# Patient Record
Sex: Female | Born: 1950 | Race: White | Hispanic: No | Marital: Married | Smoking: Former smoker
Health system: Southern US, Community
[De-identification: ages and names within clinical notes are randomized; demographics above are authoritative.]

## PROBLEM LIST (undated history)

## (undated) DIAGNOSIS — E119 Type 2 diabetes mellitus without complications: Secondary | ICD-10-CM

## (undated) DIAGNOSIS — I6529 Occlusion and stenosis of unspecified carotid artery: Secondary | ICD-10-CM

## (undated) DIAGNOSIS — I82409 Acute embolism and thrombosis of unspecified deep veins of unspecified lower extremity: Secondary | ICD-10-CM

## (undated) DIAGNOSIS — E785 Hyperlipidemia, unspecified: Secondary | ICD-10-CM

## (undated) HISTORY — PX: LEG SURGERY: SHX1003

## (undated) HISTORY — DX: Hyperlipidemia, unspecified: E78.5

## (undated) HISTORY — DX: Acute embolism and thrombosis of unspecified deep veins of unspecified lower extremity: I82.409

## (undated) HISTORY — DX: Occlusion and stenosis of unspecified carotid artery: I65.29

## (undated) HISTORY — PX: ABDOMINAL HYSTERECTOMY: SHX81

## (undated) HISTORY — DX: Type 2 diabetes mellitus without complications: E11.9

## (undated) HISTORY — PX: ROTATOR CUFF REPAIR: SHX139

---

## 2002-11-28 ENCOUNTER — Ambulatory Visit (HOSPITAL_COMMUNITY): Admission: RE | Admit: 2002-11-28 | Discharge: 2002-11-28 | Payer: Self-pay | Admitting: *Deleted

## 2002-11-28 ENCOUNTER — Encounter: Payer: Self-pay | Admitting: *Deleted

## 2003-08-22 ENCOUNTER — Inpatient Hospital Stay (HOSPITAL_COMMUNITY)
Admission: RE | Admit: 2003-08-22 | Discharge: 2003-09-12 | Payer: Self-pay | Admitting: Physical Medicine & Rehabilitation

## 2007-04-06 ENCOUNTER — Ambulatory Visit: Payer: Self-pay | Admitting: Vascular Surgery

## 2008-03-21 ENCOUNTER — Ambulatory Visit: Payer: Self-pay | Admitting: Vascular Surgery

## 2009-03-27 ENCOUNTER — Ambulatory Visit: Payer: Self-pay | Admitting: Vascular Surgery

## 2009-10-02 ENCOUNTER — Ambulatory Visit: Payer: Self-pay | Admitting: Vascular Surgery

## 2010-03-21 ENCOUNTER — Ambulatory Visit: Payer: Self-pay | Admitting: Vascular Surgery

## 2010-12-16 ENCOUNTER — Ambulatory Visit
Admission: RE | Admit: 2010-12-16 | Discharge: 2010-12-16 | Payer: Self-pay | Source: Home / Self Care | Attending: Vascular Surgery | Admitting: Vascular Surgery

## 2011-04-22 NOTE — Procedures (Signed)
CAROTID DUPLEX EXAM   INDICATION:  Follow up known carotid disease.   HISTORY:  Diabetes:  no  Cardiac:  no  Hypertension:  no  Smoking:  previous  Previous Surgery:  no  CV History:  no  Amaurosis Fugax No, Paresthesias No, Hemiparesis No                                       RIGHT             LEFT  Brachial systolic pressure:         148               140  Brachial Doppler waveforms:         triphasic         triphasic  Vertebral direction of flow:        antegrade         antegrade  DUPLEX VELOCITIES (cm/sec)  CCA peak systolic                   108               126  ECA peak systolic                   140               103  ICA peak systolic                   309               156  ICA end diastolic                   92                10  PLAQUE MORPHOLOGY:                  heterogenous      heterogenous  PLAQUE AMOUNT:                      Moderate to severe                  mild  PLAQUE LOCATION:                    ICA/ECA           ICA/ECA   IMPRESSION:  1. Right internal carotid artery suggests 60-79% stenosis.  2. Left internal carotid artery suggests 40-59% stenosis.  3. Antegrade flow in bilateral vertebrals.        ___________________________________________  Di Kindle. Edilia Bo, M.D.   CB/MEDQ  D:  10/02/2009  T:  10/03/2009  Job:  [id number]
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
## 2011-04-22 NOTE — Procedures (Signed)
CAROTID DUPLEX EXAM   INDICATION:  Follow up known carotid disease.   HISTORY:  Diabetes:  No.  Cardiac:  No.  Hypertension:  No.  Smoking:  Quit.  Previous Surgery:  No.  CV History:  Amaurosis Fugax No, Paresthesias No, Hemiparesis No.                                       RIGHT             LEFT  Brachial systolic pressure:         130               120  Brachial Doppler waveforms:         WNL               WNL  Vertebral direction of flow:        Antegrade         Antegrade  DUPLEX VELOCITIES (cm/sec)  CCA peak systolic                   108               91  ECA peak systolic                   113               90  ICA peak systolic                   240               121  ICA end diastolic                   81                53  PLAQUE MORPHOLOGY:                  Soft/heterogenous Heterogenous  PLAQUE AMOUNT:                      Moderate          Mild  PLAQUE LOCATION:                    ICA               ICA   IMPRESSION:  1. 60% to 79% right internal carotid artery stenosis.  2. 40% to 59% left internal carotid artery stenosis.  3. Antegrade vertebral arteries bilaterally.  4. Stable findings compared to study of 03/21/2010.   ___________________________________________  Quita Skye. Hart [place], M.D.   LT/MEDQ  D:  12/16/2010  T:  12/16/2010  Job:  [id number]

## 2011-04-22 NOTE — Procedures (Signed)
CAROTID DUPLEX EXAM   INDICATION:  Followup known carotid artery disease.   HISTORY:  Diabetes:  No.  Cardiac:  No.  Hypertension:  No.  Smoking:  Quit.  Previous Surgery:  No.  CV History:  No.  Amaurosis Fugax No, Paresthesias No, Hemiparesis No                                       RIGHT             LEFT  Brachial systolic pressure:         120               122  Brachial Doppler waveforms:         Biphasic          Biphasic  Vertebral direction of flow:        Antegrade         Antegrade  DUPLEX VELOCITIES (cm/sec)  CCA peak systolic                   112               99  ECA peak systolic                   111               139  ICA peak systolic                   282               152  ICA end diastolic                   64                37  PLAQUE MORPHOLOGY:                  Heterogeneous     Heterogeneous  PLAQUE AMOUNT:                      Moderate to severe                  Mild  PLAQUE LOCATION:                    ICA and ECA       ICA and ECA   IMPRESSION:  1. 60%-79% stenosis noted in the right internal carotid artery.  2. 40%-59% stenosis noted in the left internal carotid artery.  3. Antegrade bilateral vertebral arteries.   ___________________________________________  Quita Skye Hart [place], M.D.   MG/MEDQ  D:  03/21/2010  T:  03/21/2010  Job:  [id number]

## 2011-04-22 NOTE — Procedures (Signed)
CAROTID DUPLEX EXAM   INDICATION:  Carotid disease.   HISTORY:  Diabetes:  No.  Cardiac:  No.  Hypertension:  No.  Smoking:  Previous.  Previous Surgery:  No.  CV History:  Currently asymptomatic.  Amaurosis Fugax No, Paresthesias No, Hemiparesis No.                                       RIGHT             LEFT  Brachial systolic pressure:         130               138  Brachial Doppler waveforms:         Normal            Normal  Vertebral direction of flow:        Antegrade         Antegrade  DUPLEX VELOCITIES (cm/sec)  CCA peak systolic                   77                134  ECA peak systolic                   131               130  ICA peak systolic                   264               121  ICA end diastolic                   77                36  PLAQUE MORPHOLOGY:                  Heterogenous      Heterogenous  PLAQUE AMOUNT:                      Moderate/Severe   Mild  PLAQUE LOCATION:                    ICA/ECA           ICA/ECA   IMPRESSION:  1. Doppler velocities suggest a high-end 60-79% stenosis of the right      internal carotid artery.  2. 40-59% stenosis of the left internal carotid artery.  3. No significant change noted when compared to the previous      examination on 03/21/08.   ___________________________________________  Di Kindle. Edilia Bo, M.D.   CH/MEDQ  D:  03/27/2009  T:  03/27/2009  Job:  [id number]
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
## 2011-06-26 ENCOUNTER — Other Ambulatory Visit: Payer: Self-pay

## 2011-06-26 ENCOUNTER — Ambulatory Visit: Payer: Self-pay | Admitting: Vascular Surgery

## 2011-07-01 ENCOUNTER — Ambulatory Visit: Payer: Self-pay | Admitting: Vascular Surgery

## 2011-07-01 ENCOUNTER — Other Ambulatory Visit: Payer: Self-pay

## 2011-07-21 ENCOUNTER — Encounter: Payer: Self-pay | Admitting: Vascular Surgery

## 2011-08-05 ENCOUNTER — Ambulatory Visit (INDEPENDENT_AMBULATORY_CARE_PROVIDER_SITE_OTHER): Payer: PRIVATE HEALTH INSURANCE | Admitting: Vascular Surgery

## 2011-08-05 ENCOUNTER — Encounter: Payer: Self-pay | Admitting: Vascular Surgery

## 2011-08-05 ENCOUNTER — Ambulatory Visit (INDEPENDENT_AMBULATORY_CARE_PROVIDER_SITE_OTHER): Payer: PRIVATE HEALTH INSURANCE

## 2011-08-05 VITALS — BP 137/82 | HR 80 | Temp 98.4°F | Ht 67.0 in | Wt 230.0 lb

## 2011-08-05 DIAGNOSIS — I6529 Occlusion and stenosis of unspecified carotid artery: Secondary | ICD-10-CM
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
## 2011-08-19 NOTE — Procedures (Unsigned)
CAROTID DUPLEX EXAM  INDICATION:  Carotid disease.  HISTORY: Diabetes:  No. Cardiac:  No. Hypertension:  No. Smoking:  Quit 2004. Previous Surgery:  No. CV History:  Currently asymptomatic. Amaurosis Fugax No, Paresthesias No, Hemiparesis No. Other:  Hyperlipidemia.                                      RIGHT             LEFT Brachial systolic pressure:         130               120 Brachial Doppler waveforms:         Normal            Normal Vertebral direction of flow:        Antegrade         Antegrade DUPLEX VELOCITIES (cm/sec) CCA peak systolic                   87                106 ECA peak systolic                   113               117 ICA peak systolic                   279               123 ICA end diastolic                   97                38 PLAQUE MORPHOLOGY:                  Soft/heterogenous Heterogenous PLAQUE AMOUNT:                      Moderate          Mild PLAQUE LOCATION:                    ICA               ICA  IMPRESSION: 1. Right internal carotid artery velocities suggest 60% to 79%     stenosis. 2. Left internal carotid artery velocities suggest 40% to 59%     stenosis. 3. Antegrade vertebral arteries bilaterally. 4. Essentially stable in comparison to the study performed 12/16/2010.  ___________________________________________ Quita Skye. Hart Rochester, M.D.  EM/MEDQ  D:  08/05/2011  T:  08/05/2011  Job:  [id number]

## 2012-01-19 DIAGNOSIS — Z86010 Personal history of colonic polyps: Secondary | ICD-10-CM | POA: Diagnosis not present

## 2012-01-19 DIAGNOSIS — K591 Functional diarrhea: Secondary | ICD-10-CM | POA: Diagnosis not present

## 2012-02-02 ENCOUNTER — Other Ambulatory Visit: Payer: Self-pay | Admitting: *Deleted

## 2012-02-02 DIAGNOSIS — I6529 Occlusion and stenosis of unspecified carotid artery: Secondary | ICD-10-CM

## 2012-02-05 ENCOUNTER — Other Ambulatory Visit (INDEPENDENT_AMBULATORY_CARE_PROVIDER_SITE_OTHER): Payer: BC Managed Care – PPO | Admitting: *Deleted

## 2012-02-05 DIAGNOSIS — I6529 Occlusion and stenosis of unspecified carotid artery: Secondary | ICD-10-CM

## 2012-02-18 ENCOUNTER — Other Ambulatory Visit: Payer: Self-pay | Admitting: *Deleted

## 2012-02-18 DIAGNOSIS — I6529 Occlusion and stenosis of unspecified carotid artery: Secondary | ICD-10-CM

## 2012-02-19 ENCOUNTER — Encounter: Payer: Self-pay | Admitting: Vascular Surgery

## 2012-02-19 NOTE — Procedures (Unsigned)
CAROTID DUPLEX EXAM  INDICATION:  Carotid disease.  HISTORY: Diabetes:  No. Cardiac:  No. Hypertension:  No. Smoking:  Quit in 2004. Previous Surgery:  No. CV History:  Asymptomatic. Amaurosis Fugax No, Paresthesias No, Hemiparesis No. Other:  Hyperlipidemia.                                      RIGHT             LEFT Brachial systolic pressure:         128               125 Brachial Doppler waveforms:         Normal            Normal Vertebral direction of flow:        Antegrade         Antegrade DUPLEX VELOCITIES (cm/sec) CCA peak systolic                   101               94 ECA peak systolic                   101               98 ICA peak systolic                   202               111 ICA end diastolic                   74                36 PLAQUE MORPHOLOGY:                  Mixed             Heterogenous PLAQUE AMOUNT:                      Moderate          Mild PLAQUE LOCATION:                    ICA               ICA  IMPRESSION: 1. Right internal carotid artery velocities suggest 60% to 79%     stenosis. 2. Left internal carotid artery suggests high-end 1% to 39% stenosis. 3. Antegrade vertebral arteries bilaterally. 4. Stable in comparison to the previous examination.  ___________________________________________ Quita Skye Hart Rochester, M.D.  EM/MEDQ  D:  02/06/2012  T:  02/06/2012  Job:  [id number]

## 2012-08-06 ENCOUNTER — Encounter: Payer: Self-pay | Admitting: Neurosurgery

## 2012-08-10 ENCOUNTER — Ambulatory Visit: Payer: BC Managed Care – PPO | Admitting: Neurosurgery

## 2012-08-10 ENCOUNTER — Other Ambulatory Visit: Payer: BC Managed Care – PPO

## 2012-08-13 ENCOUNTER — Encounter: Payer: Self-pay | Admitting: Neurosurgery

## 2012-08-16 ENCOUNTER — Other Ambulatory Visit (INDEPENDENT_AMBULATORY_CARE_PROVIDER_SITE_OTHER): Payer: BC Managed Care – PPO | Admitting: *Deleted

## 2012-08-16 ENCOUNTER — Ambulatory Visit (INDEPENDENT_AMBULATORY_CARE_PROVIDER_SITE_OTHER): Payer: BC Managed Care – PPO | Admitting: Neurosurgery

## 2012-08-16 ENCOUNTER — Other Ambulatory Visit: Payer: Self-pay | Admitting: *Deleted

## 2012-08-16 ENCOUNTER — Encounter: Payer: Self-pay | Admitting: Neurosurgery

## 2012-08-16 VITALS — BP 126/69 | HR 92 | Resp 16 | Ht 67.0 in | Wt 202.0 lb

## 2012-08-16 DIAGNOSIS — I6529 Occlusion and stenosis of unspecified carotid artery: Secondary | ICD-10-CM

## 2012-08-16 NOTE — Progress Notes (Signed)
[facility]  Carotid Office Note  CC: Six-month carotid surveillance Referring Physician: Hart Blevins  History of Present Illness: 61 year old female patient of Dr. Hart Blevins with known carotid stenosis. The patient denies any signs or symptoms of CVA,  TIA, amaurosis fugax or neural deficit. The patient is in a wheelchair for mobilization but has no other vascular complaints.  Past Medical History  Diagnosis Date  . Carotid artery occlusion   . Hyperlipidemia     ROS: [x]  Positive   [ ]  Denies    General: [ ]  Weight loss, [ ]  Fever, [ ]  chills Neurologic: [ ]  Dizziness, [ ]  Blackouts, [ ]  Seizure [ ]  Stroke, [ ]  "Mini stroke", [ ]  Slurred speech, [ ]  Temporary blindness; [ ]  weakness in arms or legs, [ ]  Hoarseness Cardiac: [x ] Chest pain/pressure, [ ]  Shortness of breath at rest [ x] Shortness of breath with exertion, [ ]  Atrial fibrillation or irregular heartbeat Vascular: x[ ]  Pain in legs with walking, [x ] Pain in legs at rest, [ ]  Pain in legs at night,  [ ]  Non-healing ulcer, [ ]  Blood clot in vein/DVT,   Pulmonary: [ ]  Home oxygen, [ x] Productive cough, [ ]  Coughing up blood, [x ] Asthma,  [x ] Wheezing Musculoskeletal:  [ ]  Arthritis, [ ]  Low back pain, [ ]  Joint pain Hematologic: [ ]  Easy Bruising, [ ]  Anemia; [ ]  Hepatitis Gastrointestinal: [ ]  Blood in stool, [ ]  Gastroesophageal Reflux/heartburn, [ ]  Trouble swallowing Urinary: [ ]  chronic Kidney disease, [ ]  on HD - [ ]  MWF or [ ]  TTHS, [ ]  Burning with urination, [ ]  Difficulty urinating Skin: [ ]  Rashes, [ ]  Wounds Psychological: [ ]  Anxiety, [x ] Depression   Social History History  Substance Use Topics  . Smoking status: Former Smoker -- 1.0 packs/day for 30 years    Types: Cigarettes    Quit date: 07/21/2003  . Smokeless tobacco: Not on file  . Alcohol Use: No    Family History Family History  Problem Relation Age of Onset  . Heart disease Maternal Grandfather     No Known  Allergies  Current Outpatient Prescriptions  Medication Sig Dispense Refill  . atorvastatin (LIPITOR) 20 MG tablet Take 80 mg by mouth daily.       . clopidogrel (PLAVIX) 75 MG tablet Take 75 mg by mouth daily.        . diazepam (VALIUM) 5 MG tablet Take 5 mg by mouth every 8 (eight) hours as needed.        . fexofenadine-pseudoephedrine (ALLEGRA-D) 60-120 MG per tablet Take 1 tablet by mouth daily. Prn only      . naproxen sodium (ANAPROX) 220 MG tablet Take 220 mg by mouth as needed.        Marland Kitchen omeprazole (PRILOSEC) 20 MG capsule Take 20 mg by mouth daily.        Marland Kitchen oxyCODONE (OXY IR/ROXICODONE) 5 MG immediate release tablet Take 5 mg by mouth as needed.      . RABEprazole (ACIPHEX) 20 MG tablet Take 20 mg by mouth daily.        Marland Kitchen FLUoxetine (PROZAC) 40 MG capsule Take 40 mg by mouth daily.      . methadone (DOLOPHINE) 5 MG tablet Take 5 mg by mouth daily.        Physical Examination  Filed Vitals:   08/16/12 1526  BP: 126/69  Pulse: 92  Resp:  Body mass index is 31.64 kg/(m^2).  General:  WDWN in NAD Gait: Normal HEENT: WNL Eyes: Pupils equal Pulmonary: normal non-labored breathing , without Rales, rhonchi,  wheezing Cardiac: RRR, without  Murmurs, rubs or gallops; Abdomen: soft, NT, no masses Skin: no rashes, ulcers noted  Vascular Exam Pulses: 2+ radial pulses bilaterally Carotid bruits: Carotid pulses to auscultation on the left, bruit heard on the right Extremities without ischemic changes, no Gangrene , no cellulitis; no open wounds;  Musculoskeletal: no muscle wasting or atrophy   Neurologic: A&O X 3; Appropriate Affect ; SENSATION: normal; MOTOR FUNCTION:  moving all extremities equally. Speech is fluent/normal  Non-Invasive Vascular Imaging CAROTID DUPLEX 08/16/2012  Right ICA 60 - 79 % stenosis Left ICA 20 - 39 % stenosis   ASSESSMENT/PLAN: Asymptomatic patient with advanced right ICA stenosis. The patient will followup here in 6 months with repeat carotid  duplex. The patient's questions were encouraged and answered, she is in agreement with this plan. The patient knows the signs and symptoms of CVA and knows to go to the nearest emergency department should that occur.  [name] ANP   Clinic MD: Leslie Blevins

## 2013-01-25 DIAGNOSIS — J069 Acute upper respiratory infection, unspecified: Secondary | ICD-10-CM | POA: Diagnosis not present

## 2013-01-25 DIAGNOSIS — E039 Hypothyroidism, unspecified: Secondary | ICD-10-CM | POA: Diagnosis not present

## 2013-02-14 ENCOUNTER — Encounter: Payer: Self-pay | Admitting: Neurosurgery

## 2013-02-15 ENCOUNTER — Ambulatory Visit: Payer: BC Managed Care – PPO | Admitting: Neurosurgery

## 2013-02-15 ENCOUNTER — Other Ambulatory Visit (INDEPENDENT_AMBULATORY_CARE_PROVIDER_SITE_OTHER): Payer: Medicare Other | Admitting: *Deleted

## 2013-02-15 DIAGNOSIS — I6529 Occlusion and stenosis of unspecified carotid artery: Secondary | ICD-10-CM | POA: Diagnosis not present

## 2013-02-16 ENCOUNTER — Other Ambulatory Visit: Payer: Self-pay | Admitting: *Deleted

## 2013-02-17 ENCOUNTER — Encounter: Payer: Self-pay | Admitting: Vascular Surgery

## 2013-02-28 DIAGNOSIS — E039 Hypothyroidism, unspecified: Secondary | ICD-10-CM | POA: Diagnosis not present

## 2013-08-22 ENCOUNTER — Encounter: Payer: Self-pay | Admitting: Family

## 2013-08-23 ENCOUNTER — Encounter: Payer: Self-pay | Admitting: Family

## 2013-08-23 ENCOUNTER — Ambulatory Visit (INDEPENDENT_AMBULATORY_CARE_PROVIDER_SITE_OTHER): Payer: No Typology Code available for payment source | Admitting: Family

## 2013-08-23 ENCOUNTER — Other Ambulatory Visit (INDEPENDENT_AMBULATORY_CARE_PROVIDER_SITE_OTHER): Payer: No Typology Code available for payment source | Admitting: *Deleted

## 2013-08-23 DIAGNOSIS — M79609 Pain in unspecified limb: Secondary | ICD-10-CM | POA: Insufficient documentation

## 2013-08-23 DIAGNOSIS — I6529 Occlusion and stenosis of unspecified carotid artery: Secondary | ICD-10-CM
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
## 2013-08-23 NOTE — Patient Instructions (Signed)
Stroke Prevention Some medical conditions and behaviors are associated with an increased chance of having a stroke. You may prevent a stroke by making healthy choices and managing medical conditions. Reduce your risk of having a stroke by:  Staying physically active. Get at least 30 minutes of activity on most or all days.  Not smoking. It may also be helpful to avoid exposure to secondhand smoke.  Limiting alcohol use. Moderate alcohol use is considered to be:  No more than 2 drinks per day for men.  No more than 1 drink per day for nonpregnant women.  Eating healthy foods.  Include 5 or more servings of fruits and vegetables a day.  Certain diets may be prescribed to address high blood pressure, high cholesterol, diabetes, or obesity.  Managing your cholesterol levels.  A low-saturated fat, low-trans fat, low-cholesterol, and high-fiber diet may control cholesterol levels.  Take any prescribed medicines to control cholesterol as directed by your caregiver.  Managing your diabetes.  A controlled-carbohydrate, controlled-sugar diet is recommended to manage diabetes.  Take any prescribed medicines to control diabetes as directed by your caregiver.  Controlling your high blood pressure (hypertension).  A low-salt (sodium), low-saturated fat, low-trans fat, and low-cholesterol diet is recommended to manage high blood pressure.  Take any prescribed medicines to control hypertension as directed by your caregiver.  Maintaining a healthy weight.  A reduced-calorie, low-sodium, low-saturated fat, low-trans fat, low-cholesterol diet is recommended to manage weight.  Stopping drug abuse.  Avoiding birth control pills.  Talk to your caregiver about the risks of taking birth control pills if you are over 35 years old, smoke, get migraines, or have ever had a blood clot.  Getting evaluated for sleep disorders (sleep apnea).  Talk to your caregiver about getting a sleep evaluation  if you snore a lot or have excessive sleepiness.  Taking medicines as directed by your caregiver.  For some people, aspirin or blood thinners (anticoagulants) are helpful in reducing the risk of forming abnormal blood clots that can lead to stroke. If you have the irregular heart rhythm of atrial fibrillation, you should be on a blood thinner unless there is a good reason you cannot take them.  Understand all your medicine instructions. SEEK IMMEDIATE MEDICAL CARE IF:   You have sudden weakness or numbness of the face, arm, or leg, especially on one side of the body.  You have sudden confusion.  You have trouble speaking (aphasia) or understanding.  You have sudden trouble seeing in one or both eyes.  You have sudden trouble walking.  You have dizziness.  You have a loss of balance or coordination.  You have a sudden, severe headache with no known cause.  You have new chest pain or an irregular heartbeat. Any of these symptoms may represent a serious problem that is an emergency. Do not wait to see if the symptoms will go away. Get medical help right away. Call your local emergency services (911 in [place] Do not drive yourself to the hospital. Document Released: 01/01/2005 Document Revised: 02/16/2012 Document Reviewed: 07/14/2011 [employer] Patient Information 2014 [employer].  

## 2013-10-27 DIAGNOSIS — IMO0001 Reserved for inherently not codable concepts without codable children: Secondary | ICD-10-CM | POA: Diagnosis not present

## 2013-10-27 DIAGNOSIS — Z79899 Other long term (current) drug therapy: Secondary | ICD-10-CM | POA: Diagnosis not present

## 2013-10-27 DIAGNOSIS — E7800 Pure hypercholesterolemia, unspecified: Secondary | ICD-10-CM | POA: Diagnosis not present

## 2013-10-27 DIAGNOSIS — Z23 Encounter for immunization: Secondary | ICD-10-CM | POA: Diagnosis not present

## 2014-01-12 ENCOUNTER — Other Ambulatory Visit: Payer: Self-pay | Admitting: Family

## 2014-01-12 DIAGNOSIS — I6529 Occlusion and stenosis of unspecified carotid artery: Secondary | ICD-10-CM

## 2014-01-12 DIAGNOSIS — M79609 Pain in unspecified limb: Secondary | ICD-10-CM

## 2014-02-03 DIAGNOSIS — E039 Hypothyroidism, unspecified: Secondary | ICD-10-CM | POA: Diagnosis not present

## 2014-02-03 DIAGNOSIS — G8929 Other chronic pain: Secondary | ICD-10-CM | POA: Diagnosis not present

## 2014-02-03 DIAGNOSIS — IMO0001 Reserved for inherently not codable concepts without codable children: Secondary | ICD-10-CM | POA: Diagnosis not present

## 2014-02-23 ENCOUNTER — Ambulatory Visit: Payer: Medicare Other | Admitting: Family

## 2014-02-23 ENCOUNTER — Other Ambulatory Visit (HOSPITAL_COMMUNITY): Payer: Medicare Other

## 2014-03-21 ENCOUNTER — Encounter: Payer: Self-pay | Admitting: Family

## 2014-03-22 ENCOUNTER — Ambulatory Visit (INDEPENDENT_AMBULATORY_CARE_PROVIDER_SITE_OTHER): Payer: Medicare Other | Admitting: Family

## 2014-03-22 ENCOUNTER — Encounter: Payer: Self-pay | Admitting: Family

## 2014-03-22 ENCOUNTER — Ambulatory Visit (HOSPITAL_COMMUNITY)
Admission: RE | Admit: 2014-03-22 | Discharge: 2014-03-22 | Disposition: A | Discharge status: Home / Self Care | Payer: Medicare Other | Source: Ambulatory Visit | Attending: Family | Admitting: Family

## 2014-03-22 VITALS — BP 118/74 | HR 71 | Resp 16 | Ht 67.5 in | Wt 179.0 lb

## 2014-03-22 DIAGNOSIS — I6529 Occlusion and stenosis of unspecified carotid artery: Secondary | ICD-10-CM

## 2014-03-22 DIAGNOSIS — M79609 Pain in unspecified limb: Secondary | ICD-10-CM | POA: Diagnosis not present
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
## 2014-08-23 DIAGNOSIS — H547 Unspecified visual loss: Secondary | ICD-10-CM | POA: Diagnosis not present

## 2014-08-23 DIAGNOSIS — H2510 Age-related nuclear cataract, unspecified eye: Secondary | ICD-10-CM | POA: Diagnosis not present

## 2014-08-23 DIAGNOSIS — H35349 Macular cyst, hole, or pseudohole, unspecified eye: Secondary | ICD-10-CM | POA: Diagnosis not present

## 2014-08-23 DIAGNOSIS — E119 Type 2 diabetes mellitus without complications: Secondary | ICD-10-CM | POA: Diagnosis not present

## 2014-09-18 ENCOUNTER — Other Ambulatory Visit: Payer: Self-pay | Admitting: *Deleted

## 2014-09-18 DIAGNOSIS — I6523 Occlusion and stenosis of bilateral carotid arteries: Secondary | ICD-10-CM

## 2014-09-21 ENCOUNTER — Encounter: Payer: Self-pay | Admitting: Family

## 2014-09-22 ENCOUNTER — Ambulatory Visit (HOSPITAL_COMMUNITY)
Admission: RE | Admit: 2014-09-22 | Discharge: 2014-09-22 | Disposition: A | Discharge status: Home / Self Care | Payer: Medicare Other | Source: Ambulatory Visit | Attending: Family | Admitting: Family

## 2014-09-22 ENCOUNTER — Encounter: Payer: Self-pay | Admitting: Family

## 2014-09-22 ENCOUNTER — Ambulatory Visit (INDEPENDENT_AMBULATORY_CARE_PROVIDER_SITE_OTHER): Payer: Medicare Other | Admitting: Family

## 2014-09-22 VITALS — BP 111/65 | HR 64 | Resp 16 | Ht 68.0 in | Wt 179.0 lb

## 2014-09-22 DIAGNOSIS — R51 Headache: Secondary | ICD-10-CM

## 2014-09-22 DIAGNOSIS — I6529 Occlusion and stenosis of unspecified carotid artery: Secondary | ICD-10-CM | POA: Insufficient documentation

## 2014-09-22 DIAGNOSIS — R519 Headache, unspecified: Secondary | ICD-10-CM | POA: Insufficient documentation

## 2014-09-22 DIAGNOSIS — M79603 Pain in arm, unspecified: Secondary | ICD-10-CM | POA: Diagnosis not present

## 2014-09-22 DIAGNOSIS — I6523 Occlusion and stenosis of bilateral carotid arteries: Secondary | ICD-10-CM | POA: Insufficient documentation

## 2014-09-22 DIAGNOSIS — G8929 Other chronic pain: Secondary | ICD-10-CM

## 2014-09-22 NOTE — Addendum Note (Signed)
Addended by: Sharee PimpleMCCHESNEY, MARILYN K on: 09/22/2014 02:58 PM   Modules accepted: Orders
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
## 2014-09-22 NOTE — Patient Instructions (Signed)
Stroke Prevention Some medical conditions and behaviors are associated with an increased chance of having a stroke. You may prevent a stroke by making healthy choices and managing medical conditions. HOW CAN I REDUCE MY RISK OF HAVING A STROKE?   Stay physically active. Get at least 30 minutes of activity on most or all days.  Do not smoke. It may also be helpful to avoid exposure to secondhand smoke.  Limit alcohol use. Moderate alcohol use is considered to be:  No more than 2 drinks per day for men.  No more than 1 drink per day for nonpregnant women.  Eat healthy foods. This involves:  Eating 5 or more servings of fruits and vegetables a day.  Making dietary changes that address high blood pressure (hypertension), high cholesterol, diabetes, or obesity.  Manage your cholesterol levels.  Making food choices that are high in fiber and low in saturated fat, trans fat, and cholesterol may control cholesterol levels.  Take any prescribed medicines to control cholesterol as directed by your health care provider.  Manage your diabetes.  Controlling your carbohydrate and sugar intake is recommended to manage diabetes.  Take any prescribed medicines to control diabetes as directed by your health care provider.  Control your hypertension.  Making food choices that are low in salt (sodium), saturated fat, trans fat, and cholesterol is recommended to manage hypertension.  Take any prescribed medicines to control hypertension as directed by your health care provider.  Maintain a healthy weight.  Reducing calorie intake and making food choices that are low in sodium, saturated fat, trans fat, and cholesterol are recommended to manage weight.  Stop drug abuse.  Avoid taking birth control pills.  Talk to your health care provider about the risks of taking birth control pills if you are over 35 years old, smoke, get migraines, or have ever had a blood clot.  Get evaluated for sleep  disorders (sleep apnea).  Talk to your health care provider about getting a sleep evaluation if you snore a lot or have excessive sleepiness.  Take medicines only as directed by your health care provider.  For some people, aspirin or blood thinners (anticoagulants) are helpful in reducing the risk of forming abnormal blood clots that can lead to stroke. If you have the irregular heart rhythm of atrial fibrillation, you should be on a blood thinner unless there is a good reason you cannot take them.  Understand all your medicine instructions.  Make sure that other conditions (such as anemia or atherosclerosis) are addressed. SEEK IMMEDIATE MEDICAL CARE IF:   You have sudden weakness or numbness of the face, arm, or leg, especially on one side of the body.  Your face or eyelid droops to one side.  You have sudden confusion.  You have trouble speaking (aphasia) or understanding.  You have sudden trouble seeing in one or both eyes.  You have sudden trouble walking.  You have dizziness.  You have a loss of balance or coordination.  You have a sudden, severe headache with no known cause.  You have new chest pain or an irregular heartbeat. Any of these symptoms may represent a serious problem that is an emergency. Do not wait to see if the symptoms will go away. Get medical help at once. Call your local emergency services (911 in [place] Do not drive yourself to the hospital. Document Released: 01/01/2005 Document Revised: 04/10/2014 Document Reviewed: 05/27/2013 [employer] Patient Information 2015 [employer]. This information is not intended to replace advice given   to you by your health care provider. Make sure you discuss any questions you have with your health care provider.  

## 2014-09-25 DIAGNOSIS — E1165 Type 2 diabetes mellitus with hyperglycemia: Secondary | ICD-10-CM | POA: Diagnosis not present

## 2014-09-27 ENCOUNTER — Other Ambulatory Visit (HOSPITAL_COMMUNITY): Payer: Medicare Other

## 2014-09-27 ENCOUNTER — Ambulatory Visit: Payer: Medicare Other | Admitting: Family

## 2014-10-05 DIAGNOSIS — E780 Pure hypercholesterolemia: Secondary | ICD-10-CM | POA: Diagnosis not present

## 2014-10-05 DIAGNOSIS — E1159 Type 2 diabetes mellitus with other circulatory complications: Secondary | ICD-10-CM | POA: Diagnosis not present

## 2014-10-05 DIAGNOSIS — E1169 Type 2 diabetes mellitus with other specified complication: Secondary | ICD-10-CM | POA: Diagnosis not present

## 2014-10-05 DIAGNOSIS — Z23 Encounter for immunization: Secondary | ICD-10-CM | POA: Diagnosis not present

## 2014-12-12 DIAGNOSIS — H35342 Macular cyst, hole, or pseudohole, left eye: Secondary | ICD-10-CM | POA: Diagnosis not present

## 2014-12-12 DIAGNOSIS — H25812 Combined forms of age-related cataract, left eye: Secondary | ICD-10-CM | POA: Diagnosis not present

## 2015-01-16 DIAGNOSIS — Z79899 Other long term (current) drug therapy: Secondary | ICD-10-CM | POA: Diagnosis not present

## 2015-01-16 DIAGNOSIS — Z7982 Long term (current) use of aspirin: Secondary | ICD-10-CM | POA: Diagnosis not present

## 2015-01-16 DIAGNOSIS — H25812 Combined forms of age-related cataract, left eye: Secondary | ICD-10-CM | POA: Diagnosis not present

## 2015-01-16 DIAGNOSIS — H269 Unspecified cataract: Secondary | ICD-10-CM | POA: Diagnosis not present

## 2015-01-16 DIAGNOSIS — I10 Essential (primary) hypertension: Secondary | ICD-10-CM | POA: Diagnosis not present

## 2015-01-16 DIAGNOSIS — E039 Hypothyroidism, unspecified: Secondary | ICD-10-CM | POA: Diagnosis not present

## 2015-01-16 DIAGNOSIS — E785 Hyperlipidemia, unspecified: Secondary | ICD-10-CM | POA: Diagnosis not present

## 2015-01-16 DIAGNOSIS — E119 Type 2 diabetes mellitus without complications: Secondary | ICD-10-CM | POA: Diagnosis not present

## 2015-01-16 DIAGNOSIS — Z87891 Personal history of nicotine dependence: Secondary | ICD-10-CM | POA: Diagnosis not present

## 2015-01-16 DIAGNOSIS — E669 Obesity, unspecified: Secondary | ICD-10-CM | POA: Diagnosis not present

## 2015-02-27 DIAGNOSIS — K219 Gastro-esophageal reflux disease without esophagitis: Secondary | ICD-10-CM | POA: Diagnosis not present

## 2015-02-27 DIAGNOSIS — E119 Type 2 diabetes mellitus without complications: Secondary | ICD-10-CM | POA: Diagnosis not present

## 2015-02-27 DIAGNOSIS — H25811 Combined forms of age-related cataract, right eye: Secondary | ICD-10-CM | POA: Diagnosis not present

## 2015-02-27 DIAGNOSIS — H2511 Age-related nuclear cataract, right eye: Secondary | ICD-10-CM | POA: Diagnosis not present

## 2015-02-27 DIAGNOSIS — S82401S Unspecified fracture of shaft of right fibula, sequela: Secondary | ICD-10-CM | POA: Diagnosis not present

## 2015-02-27 DIAGNOSIS — E785 Hyperlipidemia, unspecified: Secondary | ICD-10-CM | POA: Diagnosis not present

## 2015-02-27 DIAGNOSIS — H269 Unspecified cataract: Secondary | ICD-10-CM | POA: Diagnosis not present

## 2015-02-27 DIAGNOSIS — Z7982 Long term (current) use of aspirin: Secondary | ICD-10-CM | POA: Diagnosis not present

## 2015-02-27 DIAGNOSIS — G8929 Other chronic pain: Secondary | ICD-10-CM | POA: Diagnosis not present

## 2015-02-27 DIAGNOSIS — E039 Hypothyroidism, unspecified: Secondary | ICD-10-CM | POA: Diagnosis not present

## 2015-02-27 DIAGNOSIS — F329 Major depressive disorder, single episode, unspecified: Secondary | ICD-10-CM | POA: Diagnosis not present

## 2015-02-27 DIAGNOSIS — Z87891 Personal history of nicotine dependence: Secondary | ICD-10-CM | POA: Diagnosis not present

## 2015-02-27 DIAGNOSIS — S82201S Unspecified fracture of shaft of right tibia, sequela: Secondary | ICD-10-CM | POA: Diagnosis not present

## 2015-03-30 ENCOUNTER — Ambulatory Visit: Payer: Medicare Other | Admitting: Family

## 2015-03-30 ENCOUNTER — Other Ambulatory Visit (HOSPITAL_COMMUNITY): Payer: Medicare Other

## 2015-05-08 ENCOUNTER — Encounter: Payer: Self-pay | Admitting: Family

## 2015-05-11 ENCOUNTER — Ambulatory Visit (HOSPITAL_COMMUNITY)
Admission: RE | Admit: 2015-05-11 | Discharge: 2015-05-11 | Disposition: A | Discharge status: Home / Self Care | Payer: Medicare Other | Source: Ambulatory Visit | Attending: Family | Admitting: Family

## 2015-05-11 ENCOUNTER — Other Ambulatory Visit: Payer: Self-pay | Admitting: Family

## 2015-05-11 ENCOUNTER — Ambulatory Visit (INDEPENDENT_AMBULATORY_CARE_PROVIDER_SITE_OTHER): Payer: Medicare Other | Admitting: Family

## 2015-05-11 ENCOUNTER — Encounter: Payer: Self-pay | Admitting: Family

## 2015-05-11 VITALS — BP 134/78 | HR 74 | Resp 16 | Ht 66.0 in | Wt 193.0 lb

## 2015-05-11 DIAGNOSIS — I6523 Occlusion and stenosis of bilateral carotid arteries: Secondary | ICD-10-CM

## 2015-05-11 DIAGNOSIS — I6521 Occlusion and stenosis of right carotid artery: Secondary | ICD-10-CM

## 2015-05-11 DIAGNOSIS — Z87891 Personal history of nicotine dependence: Secondary | ICD-10-CM | POA: Diagnosis not present

## 2015-05-11 NOTE — Patient Instructions (Signed)
Stroke Prevention Some medical conditions and behaviors are associated with an increased chance of having a stroke. You may prevent a stroke by making healthy choices and managing medical conditions. HOW CAN I REDUCE MY RISK OF HAVING A STROKE?   Stay physically active. Get at least 30 minutes of activity on most or all days.  Do not smoke. It may also be helpful to avoid exposure to secondhand smoke.  Limit alcohol use. Moderate alcohol use is considered to be:  No more than 2 drinks per day for men.  No more than 1 drink per day for nonpregnant women.  Eat healthy foods. This involves:  Eating 5 or more servings of fruits and vegetables a day.  Making dietary changes that address high blood pressure (hypertension), high cholesterol, diabetes, or obesity.  Manage your cholesterol levels.  Making food choices that are high in fiber and low in saturated fat, trans fat, and cholesterol may control cholesterol levels.  Take any prescribed medicines to control cholesterol as directed by your health care provider.  Manage your diabetes.  Controlling your carbohydrate and sugar intake is recommended to manage diabetes.  Take any prescribed medicines to control diabetes as directed by your health care provider.  Control your hypertension.  Making food choices that are low in salt (sodium), saturated fat, trans fat, and cholesterol is recommended to manage hypertension.  Take any prescribed medicines to control hypertension as directed by your health care provider.  Maintain a healthy weight.  Reducing calorie intake and making food choices that are low in sodium, saturated fat, trans fat, and cholesterol are recommended to manage weight.  Stop drug abuse.  Avoid taking birth control pills.  Talk to your health care provider about the risks of taking birth control pills if you are over 35 years old, smoke, get migraines, or have ever had a blood clot.  Get evaluated for sleep  disorders (sleep apnea).  Talk to your health care provider about getting a sleep evaluation if you snore a lot or have excessive sleepiness.  Take medicines only as directed by your health care provider.  For some people, aspirin or blood thinners (anticoagulants) are helpful in reducing the risk of forming abnormal blood clots that can lead to stroke. If you have the irregular heart rhythm of atrial fibrillation, you should be on a blood thinner unless there is a good reason you cannot take them.  Understand all your medicine instructions.  Make sure that other conditions (such as anemia or atherosclerosis) are addressed. SEEK IMMEDIATE MEDICAL CARE IF:   You have sudden weakness or numbness of the face, arm, or leg, especially on one side of the body.  Your face or eyelid droops to one side.  You have sudden confusion.  You have trouble speaking (aphasia) or understanding.  You have sudden trouble seeing in one or both eyes.  You have sudden trouble walking.  You have dizziness.  You have a loss of balance or coordination.  You have a sudden, severe headache with no known cause.  You have new chest pain or an irregular heartbeat. Any of these symptoms may represent a serious problem that is an emergency. Do not wait to see if the symptoms will go away. Get medical help at once. Call your local emergency services (911 in [place] Do not drive yourself to the hospital. Document Released: 01/01/2005 Document Revised: 04/10/2014 Document Reviewed: 05/27/2013 [employer] Patient Information 2015 [employer]. This information is not intended to replace advice given   to you by your health care provider. Make sure you discuss any questions you have with your health care provider.  
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
## 2015-05-15 NOTE — Addendum Note (Signed)
Addended by: Adria DillELDRIDGE-LEWIS,  L on: 05/15/2015 11:10 AM   Modules accepted: Orders

## 2015-06-14 DIAGNOSIS — E780 Pure hypercholesterolemia: Secondary | ICD-10-CM | POA: Diagnosis not present

## 2015-06-14 DIAGNOSIS — E039 Hypothyroidism, unspecified: Secondary | ICD-10-CM | POA: Diagnosis not present

## 2015-06-14 DIAGNOSIS — E1169 Type 2 diabetes mellitus with other specified complication: Secondary | ICD-10-CM | POA: Diagnosis not present

## 2015-06-14 DIAGNOSIS — E669 Obesity, unspecified: Secondary | ICD-10-CM | POA: Diagnosis not present

## 2015-06-14 DIAGNOSIS — E119 Type 2 diabetes mellitus without complications: Secondary | ICD-10-CM | POA: Diagnosis not present

## 2015-06-14 DIAGNOSIS — Z0000 Encounter for general adult medical examination without abnormal findings: Secondary | ICD-10-CM | POA: Diagnosis not present

## 2015-06-14 DIAGNOSIS — Z23 Encounter for immunization: Secondary | ICD-10-CM | POA: Diagnosis not present

## 2015-06-14 DIAGNOSIS — Z79899 Other long term (current) drug therapy: Secondary | ICD-10-CM | POA: Diagnosis not present

## 2015-07-05 DIAGNOSIS — M75101 Unspecified rotator cuff tear or rupture of right shoulder, not specified as traumatic: Secondary | ICD-10-CM | POA: Diagnosis not present

## 2015-08-08 DIAGNOSIS — H26493 Other secondary cataract, bilateral: Secondary | ICD-10-CM | POA: Diagnosis not present

## 2015-08-08 DIAGNOSIS — E11329 Type 2 diabetes mellitus with mild nonproliferative diabetic retinopathy without macular edema: Secondary | ICD-10-CM | POA: Diagnosis not present

## 2015-08-20 DIAGNOSIS — G8929 Other chronic pain: Secondary | ICD-10-CM | POA: Diagnosis not present

## 2015-08-20 DIAGNOSIS — M75101 Unspecified rotator cuff tear or rupture of right shoulder, not specified as traumatic: Secondary | ICD-10-CM | POA: Diagnosis not present

## 2015-08-20 DIAGNOSIS — R262 Difficulty in walking, not elsewhere classified: Secondary | ICD-10-CM | POA: Diagnosis not present

## 2015-08-20 DIAGNOSIS — Z23 Encounter for immunization: Secondary | ICD-10-CM | POA: Diagnosis not present

## 2015-08-20 DIAGNOSIS — M549 Dorsalgia, unspecified: Secondary | ICD-10-CM | POA: Diagnosis not present

## 2015-09-10 DIAGNOSIS — R402411 Glasgow coma scale score 13-15, in the field [EMT or ambulance]: Secondary | ICD-10-CM | POA: Diagnosis not present

## 2015-09-10 DIAGNOSIS — R4182 Altered mental status, unspecified: Secondary | ICD-10-CM | POA: Diagnosis not present

## 2015-09-10 DIAGNOSIS — R509 Fever, unspecified: Secondary | ICD-10-CM | POA: Diagnosis not present

## 2015-09-10 DIAGNOSIS — I639 Cerebral infarction, unspecified: Secondary | ICD-10-CM | POA: Diagnosis not present

## 2015-09-11 DIAGNOSIS — T40601A Poisoning by unspecified narcotics, accidental (unintentional), initial encounter: Secondary | ICD-10-CM | POA: Diagnosis not present

## 2015-09-11 DIAGNOSIS — E039 Hypothyroidism, unspecified: Secondary | ICD-10-CM | POA: Diagnosis present

## 2015-09-11 DIAGNOSIS — R1110 Vomiting, unspecified: Secondary | ICD-10-CM | POA: Diagnosis not present

## 2015-09-11 DIAGNOSIS — E1165 Type 2 diabetes mellitus with hyperglycemia: Secondary | ICD-10-CM | POA: Diagnosis not present

## 2015-09-11 DIAGNOSIS — Z7982 Long term (current) use of aspirin: Secondary | ICD-10-CM | POA: Diagnosis not present

## 2015-09-11 DIAGNOSIS — K2210 Ulcer of esophagus without bleeding: Secondary | ICD-10-CM | POA: Diagnosis present

## 2015-09-11 DIAGNOSIS — R4182 Altered mental status, unspecified: Secondary | ICD-10-CM | POA: Diagnosis not present

## 2015-09-11 DIAGNOSIS — R1310 Dysphagia, unspecified: Secondary | ICD-10-CM | POA: Diagnosis not present

## 2015-09-11 DIAGNOSIS — I639 Cerebral infarction, unspecified: Secondary | ICD-10-CM | POA: Diagnosis not present

## 2015-09-11 DIAGNOSIS — R4702 Dysphasia: Secondary | ICD-10-CM | POA: Diagnosis present

## 2015-09-11 DIAGNOSIS — R0602 Shortness of breath: Secondary | ICD-10-CM | POA: Diagnosis not present

## 2015-09-11 DIAGNOSIS — K259 Gastric ulcer, unspecified as acute or chronic, without hemorrhage or perforation: Secondary | ICD-10-CM | POA: Diagnosis present

## 2015-09-11 DIAGNOSIS — K222 Esophageal obstruction: Secondary | ICD-10-CM | POA: Diagnosis not present

## 2015-09-11 DIAGNOSIS — G8929 Other chronic pain: Secondary | ICD-10-CM | POA: Diagnosis present

## 2015-09-11 DIAGNOSIS — R509 Fever, unspecified: Secondary | ICD-10-CM | POA: Diagnosis not present

## 2015-09-11 DIAGNOSIS — F17290 Nicotine dependence, other tobacco product, uncomplicated: Secondary | ICD-10-CM | POA: Diagnosis present

## 2015-09-11 DIAGNOSIS — K2970 Gastritis, unspecified, without bleeding: Secondary | ICD-10-CM | POA: Diagnosis not present

## 2015-09-11 DIAGNOSIS — G9341 Metabolic encephalopathy: Secondary | ICD-10-CM | POA: Diagnosis not present

## 2015-09-11 DIAGNOSIS — F17210 Nicotine dependence, cigarettes, uncomplicated: Secondary | ICD-10-CM | POA: Diagnosis present

## 2015-09-11 DIAGNOSIS — J690 Pneumonitis due to inhalation of food and vomit: Secondary | ICD-10-CM | POA: Diagnosis not present

## 2015-09-11 DIAGNOSIS — K219 Gastro-esophageal reflux disease without esophagitis: Secondary | ICD-10-CM | POA: Diagnosis present

## 2015-09-11 DIAGNOSIS — K2980 Duodenitis without bleeding: Secondary | ICD-10-CM | POA: Diagnosis present

## 2015-09-11 DIAGNOSIS — A419 Sepsis, unspecified organism: Secondary | ICD-10-CM | POA: Diagnosis present

## 2015-09-11 DIAGNOSIS — Z79899 Other long term (current) drug therapy: Secondary | ICD-10-CM | POA: Diagnosis not present

## 2015-09-11 DIAGNOSIS — K5900 Constipation, unspecified: Secondary | ICD-10-CM | POA: Diagnosis present

## 2015-09-11 DIAGNOSIS — Z79891 Long term (current) use of opiate analgesic: Secondary | ICD-10-CM | POA: Diagnosis not present

## 2015-09-11 DIAGNOSIS — J9690 Respiratory failure, unspecified, unspecified whether with hypoxia or hypercapnia: Secondary | ICD-10-CM | POA: Diagnosis not present

## 2015-09-11 DIAGNOSIS — Z23 Encounter for immunization: Secondary | ICD-10-CM | POA: Diagnosis not present

## 2015-09-11 DIAGNOSIS — K5790 Diverticulosis of intestine, part unspecified, without perforation or abscess without bleeding: Secondary | ICD-10-CM | POA: Diagnosis present

## 2015-09-20 DIAGNOSIS — F329 Major depressive disorder, single episode, unspecified: Secondary | ICD-10-CM | POA: Diagnosis not present

## 2015-09-20 DIAGNOSIS — R4182 Altered mental status, unspecified: Secondary | ICD-10-CM | POA: Diagnosis not present

## 2015-09-20 DIAGNOSIS — K2210 Ulcer of esophagus without bleeding: Secondary | ICD-10-CM | POA: Diagnosis not present

## 2015-09-20 DIAGNOSIS — K253 Acute gastric ulcer without hemorrhage or perforation: Secondary | ICD-10-CM | POA: Diagnosis not present

## 2015-09-27 DIAGNOSIS — D649 Anemia, unspecified: Secondary | ICD-10-CM | POA: Diagnosis not present

## 2015-10-15 DIAGNOSIS — K251 Acute gastric ulcer with perforation: Secondary | ICD-10-CM | POA: Diagnosis not present

## 2015-10-15 DIAGNOSIS — K591 Functional diarrhea: Secondary | ICD-10-CM | POA: Diagnosis not present

## 2015-10-15 DIAGNOSIS — K210 Gastro-esophageal reflux disease with esophagitis: Secondary | ICD-10-CM | POA: Diagnosis not present

## 2015-10-15 DIAGNOSIS — R1310 Dysphagia, unspecified: Secondary | ICD-10-CM | POA: Diagnosis not present

## 2015-11-21 ENCOUNTER — Encounter: Payer: Self-pay | Admitting: Family

## 2015-11-27 ENCOUNTER — Ambulatory Visit (INDEPENDENT_AMBULATORY_CARE_PROVIDER_SITE_OTHER): Payer: Medicare Other | Admitting: Family

## 2015-11-27 ENCOUNTER — Ambulatory Visit (HOSPITAL_COMMUNITY)
Admission: RE | Admit: 2015-11-27 | Discharge: 2015-11-27 | Disposition: A | Discharge status: Home / Self Care | Payer: Medicare Other | Source: Ambulatory Visit | Attending: Family | Admitting: Family

## 2015-11-27 ENCOUNTER — Encounter: Payer: Self-pay | Admitting: Family

## 2015-11-27 VITALS — BP 121/73 | HR 62 | Ht 66.0 in | Wt 174.0 lb

## 2015-11-27 DIAGNOSIS — I6523 Occlusion and stenosis of bilateral carotid arteries: Secondary | ICD-10-CM

## 2015-11-27 DIAGNOSIS — Z87891 Personal history of nicotine dependence: Secondary | ICD-10-CM | POA: Insufficient documentation

## 2015-11-27 DIAGNOSIS — I6521 Occlusion and stenosis of right carotid artery: Secondary | ICD-10-CM
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
## 2015-11-27 NOTE — Patient Instructions (Addendum)
Stroke Prevention Some medical conditions and behaviors are associated with an increased chance of having a stroke. You may prevent a stroke by making healthy choices and managing medical conditions. HOW CAN I REDUCE MY RISK OF HAVING A STROKE?   Stay physically active. Get at least 30 minutes of activity on most or all days.  Do not smoke. It may also be helpful to avoid exposure to secondhand smoke.  Limit alcohol use. Moderate alcohol use is considered to be:  No more than 2 drinks per day for men.  No more than 1 drink per day for nonpregnant women.  Eat healthy foods. This involves:  Eating 5 or more servings of fruits and vegetables a day.  Making dietary changes that address high blood pressure (hypertension), high cholesterol, diabetes, or obesity.  Manage your cholesterol levels.  Making food choices that are high in fiber and low in saturated fat, trans fat, and cholesterol may control cholesterol levels.  Take any prescribed medicines to control cholesterol as directed by your health care provider.  Manage your diabetes.  Controlling your carbohydrate and sugar intake is recommended to manage diabetes.  Take any prescribed medicines to control diabetes as directed by your health care provider.  Control your hypertension.  Making food choices that are low in salt (sodium), saturated fat, trans fat, and cholesterol is recommended to manage hypertension.  Ask your health care provider if you need treatment to lower your blood pressure. Take any prescribed medicines to control hypertension as directed by your health care provider.  If you are 18-39 years of age, have your blood pressure checked every 3-5 years. If you are 40 years of age or older, have your blood pressure checked every year.  Maintain a healthy weight.  Reducing calorie intake and making food choices that are low in sodium, saturated fat, trans fat, and cholesterol are recommended to manage  weight.  Stop drug abuse.  Avoid taking birth control pills.  Talk to your health care provider about the risks of taking birth control pills if you are over 35 years old, smoke, get migraines, or have ever had a blood clot.  Get evaluated for sleep disorders (sleep apnea).  Talk to your health care provider about getting a sleep evaluation if you snore a lot or have excessive sleepiness.  Take medicines only as directed by your health care provider.  For some people, aspirin or blood thinners (anticoagulants) are helpful in reducing the risk of forming abnormal blood clots that can lead to stroke. If you have the irregular heart rhythm of atrial fibrillation, you should be on a blood thinner unless there is a good reason you cannot take them.  Understand all your medicine instructions.  Make sure that other conditions (such as anemia or atherosclerosis) are addressed. SEEK IMMEDIATE MEDICAL CARE IF:   You have sudden weakness or numbness of the face, arm, or leg, especially on one side of the body.  Your face or eyelid droops to one side.  You have sudden confusion.  You have trouble speaking (aphasia) or understanding.  You have sudden trouble seeing in one or both eyes.  You have sudden trouble walking.  You have dizziness.  You have a loss of balance or coordination.  You have a sudden, severe headache with no known cause.  You have new chest pain or an irregular heartbeat. Any of these symptoms may represent a serious problem that is an emergency. Do not wait to see if the symptoms will   go away. Get medical help at once. Call your local emergency services (911 in [place] Do not drive yourself to the hospital.   This information is not intended to replace advice given to you by your health care provider. Make sure you discuss any questions you have with your health care provider.   Document Released: 01/01/2005 Document Revised: 12/15/2014 Document Reviewed:  05/27/2013 Elsevier Interactive Patient Education 2016 [employer] Inc.    Mediterranean Diet     Why follow it? Research shows. . Those who follow the Mediterranean diet have a reduced risk of heart disease  . The diet is associated with a reduced incidence of Parkinson's and Alzheimer's diseases . People following the diet may have longer life expectancies and lower rates of chronic diseases  . The Dietary Guidelines for Americans recommends the Mediterranean diet as an eating plan to promote health and prevent disease  What Is the Mediterranean Diet?  . Healthy eating plan based on typical foods and recipes of Mediterranean-style cooking . The diet is primarily a plant based diet; these foods should make up a majority of meals   Starches - Plant based foods should make up a majority of meals - They are an important sources of vitamins, minerals, energy, antioxidants, and fiber - Choose whole grains, foods high in fiber and minimally processed items  - Typical grain sources include wheat, oats, barley, corn, brown rice, bulgar, farro, millet, polenta, couscous  - Various types of beans include chickpeas, lentils, fava beans, black beans, white beans   Fruits  Veggies - Large quantities of antioxidant rich fruits & veggies; 6 or more servings  - Vegetables can be eaten raw or lightly drizzled with oil and cooked  - Vegetables common to the traditional Mediterranean Diet include: artichokes, arugula, beets, broccoli, brussel sprouts, cabbage, carrots, celery, collard greens, cucumbers, eggplant, kale, leeks, lemons, lettuce, mushrooms, okra, onions, peas, peppers, potatoes, pumpkin, radishes, rutabaga, shallots, spinach, sweet potatoes, turnips, zucchini - Fruits common to the Mediterranean Diet include: apples, apricots, avocados, cherries, clementines, dates, figs, grapefruits, grapes, melons, nectarines, oranges, peaches, pears, pomegranates, strawberries, tangerines  Fats - Replace butter  and margarine with healthy oils, such as olive oil, canola oil, and tahini  - Limit nuts to no more than a handful a day  - Nuts include walnuts, almonds, pecans, pistachios, pine nuts  - Limit or avoid candied, honey roasted or heavily salted nuts - Olives are central to the [employer]iterranean diet - can be eaten whole or used in a variety of dishes   Meats Protein - Limiting red meat: no more than a few times a month - When eating red meat: choose lean cuts and keep the portion to the size of deck of cards - Eggs: approx. 0 to 4 times a week  - Fish and lean poultry: at least 2 a week  - Healthy protein sources include, chicken, [place], lean beef, lamb - Increase intake of seafood such as tuna, salmon, trout, mackerel, shrimp, scallops - Avoid or limit high fat processed meats such as sausage and bacon  Dairy - Include moderate amounts of low fat dairy products  - Focus on healthy dairy such as fat free yogurt, skim milk, low or reduced fat cheese - Limit dairy products higher in fat such as whole or 2% milk, cheese, ice cream  Alcohol - Moderate amounts of red wine is ok  - No more than 5 oz daily for women (all ages) and men older than age 66  -  No more than 10 oz of wine daily for men younger than 34  Other - Limit sweets and other desserts  - Use herbs and spices instead of salt to flavor foods  - Herbs and spices common to the traditional Mediterranean Diet include: basil, bay leaves, chives, cloves, cumin, fennel, garlic, lavender, marjoram, mint, oregano, parsley, pepper, rosemary, sage, savory, sumac, tarragon, thyme   It's not just a diet, it's a lifestyle:  . The Mediterranean diet includes lifestyle factors typical of those in the region  . Foods, drinks and meals are best eaten with others and savored . Daily physical activity is important for overall good health . This could be strenuous exercise like running and aerobics . This could also be more leisurely activities such as  walking, housework, yard-work, or taking the stairs . Moderation is the key; a balanced and healthy diet accommodates most foods and drinks . Consider portion sizes and frequency of consumption of certain foods   Meal Ideas & Options:  . Breakfast:  o Whole wheat toast or whole wheat English muffins with peanut butter & hard boiled egg o Steel cut oats topped with apples & cinnamon and skim milk  o Fresh fruit: banana, strawberries, melon, berries, peaches  o Smoothies: strawberries, bananas, greek yogurt, peanut butter o Low fat greek yogurt with blueberries and granola  o Egg white omelet with spinach and mushrooms o Breakfast couscous: whole wheat couscous, apricots, skim milk, cranberries  . Sandwiches:  o Hummus and grilled vegetables (peppers, zucchini, squash) on whole wheat bread   o Grilled chicken on whole wheat pita with lettuce, tomatoes, cucumbers or tzatziki  o Tuna salad on whole wheat bread: tuna salad made with greek yogurt, olives, red peppers, capers, green onions o Garlic rosemary lamb pita: lamb sauted with garlic, rosemary, salt & pepper; add lettuce, cucumber, greek yogurt to pita - flavor with lemon juice and black pepper  . Seafood:  o Mediterranean grilled salmon, seasoned with garlic, basil, parsley, lemon juice and black pepper o Shrimp, lemon, and spinach whole-grain pasta salad made with low fat greek yogurt  o Seared scallops with lemon orzo  o Seared tuna steaks seasoned salt, pepper, coriander topped with tomato mixture of olives, tomatoes, olive oil, minced garlic, parsley, green onions and cappers  . Meats:  o Herbed greek chicken salad with kalamata olives, cucumber, feta  o Red bell peppers stuffed with spinach, bulgur, lean ground beef (or lentils) & topped with feta   o Kebabs: skewers of chicken, tomatoes, onions, zucchini, squash  o [place] burgers: made with red onions, mint, dill, lemon juice, feta cheese topped with roasted red  peppers . Vegetarian o Cucumber salad: cucumbers, artichoke hearts, celery, red onion, feta cheese, tossed in olive oil & lemon juice  o Hummus and whole grain pita points with a greek salad (lettuce, tomato, feta, olives, cucumbers, red onion) o Lentil soup with celery, carrots made with vegetable broth, garlic, salt and pepper  o Tabouli salad: parsley, bulgur, mint, scallions, cucumbers, tomato, radishes, lemon juice, olive oil, salt and pepper.

## 2015-11-28 NOTE — Addendum Note (Signed)
Addended by: Adria DillELDRIDGE-LEWIS,  L on: 11/28/2015 09:56 AM   Modules accepted: Orders

## 2015-12-06 DIAGNOSIS — J208 Acute bronchitis due to other specified organisms: Secondary | ICD-10-CM | POA: Diagnosis not present

## 2015-12-06 DIAGNOSIS — B9689 Other specified bacterial agents as the cause of diseases classified elsewhere: Secondary | ICD-10-CM | POA: Diagnosis not present

## 2015-12-26 DIAGNOSIS — K2970 Gastritis, unspecified, without bleeding: Secondary | ICD-10-CM | POA: Diagnosis not present

## 2015-12-26 DIAGNOSIS — K5730 Diverticulosis of large intestine without perforation or abscess without bleeding: Secondary | ICD-10-CM | POA: Diagnosis not present

## 2015-12-26 DIAGNOSIS — K253 Acute gastric ulcer without hemorrhage or perforation: Secondary | ICD-10-CM | POA: Diagnosis not present

## 2015-12-26 DIAGNOSIS — K210 Gastro-esophageal reflux disease with esophagitis: Secondary | ICD-10-CM | POA: Diagnosis not present

## 2016-01-10 DIAGNOSIS — R1310 Dysphagia, unspecified: Secondary | ICD-10-CM | POA: Diagnosis not present

## 2016-01-10 DIAGNOSIS — Z79899 Other long term (current) drug therapy: Secondary | ICD-10-CM | POA: Diagnosis not present

## 2016-01-10 DIAGNOSIS — K259 Gastric ulcer, unspecified as acute or chronic, without hemorrhage or perforation: Secondary | ICD-10-CM | POA: Diagnosis not present

## 2016-01-10 DIAGNOSIS — M1990 Unspecified osteoarthritis, unspecified site: Secondary | ICD-10-CM | POA: Diagnosis not present

## 2016-01-10 DIAGNOSIS — K449 Diaphragmatic hernia without obstruction or gangrene: Secondary | ICD-10-CM | POA: Diagnosis not present

## 2016-01-10 DIAGNOSIS — Z7984 Long term (current) use of oral hypoglycemic drugs: Secondary | ICD-10-CM | POA: Diagnosis not present

## 2016-01-10 DIAGNOSIS — E119 Type 2 diabetes mellitus without complications: Secondary | ICD-10-CM | POA: Diagnosis not present

## 2016-01-10 DIAGNOSIS — F329 Major depressive disorder, single episode, unspecified: Secondary | ICD-10-CM | POA: Diagnosis not present

## 2016-01-10 DIAGNOSIS — K219 Gastro-esophageal reflux disease without esophagitis: Secondary | ICD-10-CM | POA: Diagnosis not present

## 2016-01-14 DIAGNOSIS — J019 Acute sinusitis, unspecified: Secondary | ICD-10-CM | POA: Diagnosis not present

## 2016-01-14 DIAGNOSIS — G8929 Other chronic pain: Secondary | ICD-10-CM | POA: Diagnosis not present

## 2016-01-14 DIAGNOSIS — M549 Dorsalgia, unspecified: Secondary | ICD-10-CM | POA: Diagnosis not present

## 2016-01-16 ENCOUNTER — Telehealth: Payer: Self-pay | Admitting: Family

## 2016-01-16 NOTE — Telephone Encounter (Signed)
Patient called to say that, when the patient saw Rosalita Chessman in December, Rosalita Chessman recommended a Mediterranean diet.  The patient purchased a book on the subject yesterday.  Most of the recipes in the book contained tomatoes, which she cannot eat.  She requested advice about substitutions / whether or not to leave out the tomatoes.  Per Rosalita Chessman, the patient's questions are best answered by a nutritionist.  I called back the patient and relayed that advice.  The patient said that she might have a former neighbor who is a Health and safety inspector.  She has diabetes so I mentioned that her insurance might pay for a nutritionist and advised her to call her primary care physician (who she says she saw recently) to discuss a possible referral. Renato Gails

## 2016-02-05 DIAGNOSIS — H26493 Other secondary cataract, bilateral: Secondary | ICD-10-CM | POA: Diagnosis not present

## 2016-02-05 DIAGNOSIS — E113293 Type 2 diabetes mellitus with mild nonproliferative diabetic retinopathy without macular edema, bilateral: Secondary | ICD-10-CM | POA: Diagnosis not present

## 2016-02-13 DIAGNOSIS — H26492 Other secondary cataract, left eye: Secondary | ICD-10-CM | POA: Diagnosis not present

## 2016-02-25 DIAGNOSIS — J329 Chronic sinusitis, unspecified: Secondary | ICD-10-CM | POA: Diagnosis not present

## 2016-02-25 DIAGNOSIS — H524 Presbyopia: Secondary | ICD-10-CM | POA: Diagnosis not present

## 2016-02-25 DIAGNOSIS — G8929 Other chronic pain: Secondary | ICD-10-CM | POA: Diagnosis not present

## 2016-02-25 DIAGNOSIS — M549 Dorsalgia, unspecified: Secondary | ICD-10-CM | POA: Diagnosis not present

## 2016-04-11 DIAGNOSIS — R51 Headache: Secondary | ICD-10-CM | POA: Diagnosis not present

## 2016-04-11 DIAGNOSIS — J329 Chronic sinusitis, unspecified: Secondary | ICD-10-CM | POA: Diagnosis not present

## 2016-04-11 DIAGNOSIS — J343 Hypertrophy of nasal turbinates: Secondary | ICD-10-CM | POA: Diagnosis not present

## 2016-04-11 DIAGNOSIS — R0981 Nasal congestion: Secondary | ICD-10-CM | POA: Diagnosis not present

## 2016-04-11 DIAGNOSIS — J3489 Other specified disorders of nose and nasal sinuses: Secondary | ICD-10-CM | POA: Diagnosis not present

## 2016-04-11 DIAGNOSIS — J342 Deviated nasal septum: Secondary | ICD-10-CM | POA: Diagnosis not present

## 2016-04-23 DIAGNOSIS — R51 Headache: Secondary | ICD-10-CM | POA: Diagnosis not present

## 2016-04-23 DIAGNOSIS — J328 Other chronic sinusitis: Secondary | ICD-10-CM | POA: Diagnosis not present

## 2016-04-23 DIAGNOSIS — J341 Cyst and mucocele of nose and nasal sinus: Secondary | ICD-10-CM | POA: Diagnosis not present

## 2016-04-23 DIAGNOSIS — J329 Chronic sinusitis, unspecified: Secondary | ICD-10-CM | POA: Diagnosis not present

## 2016-05-06 ENCOUNTER — Encounter: Payer: Self-pay | Admitting: Family

## 2016-05-07 DIAGNOSIS — J342 Deviated nasal septum: Secondary | ICD-10-CM | POA: Diagnosis not present

## 2016-05-07 DIAGNOSIS — J3489 Other specified disorders of nose and nasal sinuses: Secondary | ICD-10-CM | POA: Diagnosis not present

## 2016-05-14 ENCOUNTER — Ambulatory Visit (INDEPENDENT_AMBULATORY_CARE_PROVIDER_SITE_OTHER): Payer: Medicare Other | Admitting: Family

## 2016-05-14 ENCOUNTER — Ambulatory Visit (HOSPITAL_COMMUNITY)
Admission: RE | Admit: 2016-05-14 | Discharge: 2016-05-14 | Disposition: A | Discharge status: Home / Self Care | Payer: Medicare Other | Source: Ambulatory Visit | Attending: Family | Admitting: Family

## 2016-05-14 ENCOUNTER — Encounter: Payer: Self-pay | Admitting: Family

## 2016-05-14 VITALS — BP 123/70 | HR 75 | Ht 66.0 in | Wt 174.0 lb

## 2016-05-14 DIAGNOSIS — I6521 Occlusion and stenosis of right carotid artery: Secondary | ICD-10-CM

## 2016-05-14 DIAGNOSIS — Z87891 Personal history of nicotine dependence: Secondary | ICD-10-CM

## 2016-05-14 DIAGNOSIS — I6523 Occlusion and stenosis of bilateral carotid arteries: Secondary | ICD-10-CM | POA: Insufficient documentation

## 2016-05-14 DIAGNOSIS — E785 Hyperlipidemia, unspecified: Secondary | ICD-10-CM | POA: Insufficient documentation

## 2016-05-14 DIAGNOSIS — E119 Type 2 diabetes mellitus without complications: Secondary | ICD-10-CM | POA: Diagnosis not present

## 2016-05-14 LAB — VAS US CAROTID
LEFT ECA DIAS: 16 cm/s
LICADDIAS: -27 cm/s
LICAPDIAS: 28 cm/s
Left CCA dist dias: -27 cm/s
Left CCA dist sys: -118 cm/s
Left CCA prox dias: 22 cm/s
Left CCA prox sys: 121 cm/s
Left ICA dist sys: -95 cm/s
Left ICA prox sys: 111 cm/s
RCCADSYS: -73 cm/s
RIGHT CCA MID DIAS: 22 cm/s
RIGHT ECA DIAS: 0 cm/s
Right CCA prox dias: 20 cm/s
Right CCA prox sys: 125 cm/s
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
## 2016-05-14 NOTE — Patient Instructions (Signed)
Stroke Prevention Some medical conditions and behaviors are associated with an increased chance of having a stroke. You may prevent a stroke by making healthy choices and managing medical conditions. HOW CAN I REDUCE MY RISK OF HAVING A STROKE?   Stay physically active. Get at least 30 minutes of activity on most or all days.  Do not smoke. It may also be helpful to avoid exposure to secondhand smoke.  Limit alcohol use. Moderate alcohol use is considered to be:  No more than 2 drinks per day for men.  No more than 1 drink per day for nonpregnant women.  Eat healthy foods. This involves:  Eating 5 or more servings of fruits and vegetables a day.  Making dietary changes that address high blood pressure (hypertension), high cholesterol, diabetes, or obesity.  Manage your cholesterol levels.  Making food choices that are high in fiber and low in saturated fat, trans fat, and cholesterol may control cholesterol levels.  Take any prescribed medicines to control cholesterol as directed by your health care provider.  Manage your diabetes.  Controlling your carbohydrate and sugar intake is recommended to manage diabetes.  Take any prescribed medicines to control diabetes as directed by your health care provider.  Control your hypertension.  Making food choices that are low in salt (sodium), saturated fat, trans fat, and cholesterol is recommended to manage hypertension.  Ask your health care provider if you need treatment to lower your blood pressure. Take any prescribed medicines to control hypertension as directed by your health care provider.  If you are 18-39 years of age, have your blood pressure checked every 3-5 years. If you are 40 years of age or older, have your blood pressure checked every year.  Maintain a healthy weight.  Reducing calorie intake and making food choices that are low in sodium, saturated fat, trans fat, and cholesterol are recommended to manage  weight.  Stop drug abuse.  Avoid taking birth control pills.  Talk to your health care provider about the risks of taking birth control pills if you are over 35 years old, smoke, get migraines, or have ever had a blood clot.  Get evaluated for sleep disorders (sleep apnea).  Talk to your health care provider about getting a sleep evaluation if you snore a lot or have excessive sleepiness.  Take medicines only as directed by your health care provider.  For some people, aspirin or blood thinners (anticoagulants) are helpful in reducing the risk of forming abnormal blood clots that can lead to stroke. If you have the irregular heart rhythm of atrial fibrillation, you should be on a blood thinner unless there is a good reason you cannot take them.  Understand all your medicine instructions.  Make sure that other conditions (such as anemia or atherosclerosis) are addressed. SEEK IMMEDIATE MEDICAL CARE IF:   You have sudden weakness or numbness of the face, arm, or leg, especially on one side of the body.  Your face or eyelid droops to one side.  You have sudden confusion.  You have trouble speaking (aphasia) or understanding.  You have sudden trouble seeing in one or both eyes.  You have sudden trouble walking.  You have dizziness.  You have a loss of balance or coordination.  You have a sudden, severe headache with no known cause.  You have new chest pain or an irregular heartbeat. Any of these symptoms may represent a serious problem that is an emergency. Do not wait to see if the symptoms will   go away. Get medical help at once. Call your local emergency services (911 in [place] Do not drive yourself to the hospital.   This information is not intended to replace advice given to you by your health care provider. Make sure you discuss any questions you have with your health care provider.   Document Released: 01/01/2005 Document Revised: 12/15/2014 Document Reviewed:  05/27/2013 Elsevier Interactive Patient Education 2016 [employer] Inc.  

## 2016-07-08 ENCOUNTER — Other Ambulatory Visit: Payer: Self-pay | Admitting: *Deleted

## 2016-07-08 DIAGNOSIS — I6523 Occlusion and stenosis of bilateral carotid arteries: Secondary | ICD-10-CM

## 2016-08-05 ENCOUNTER — Other Ambulatory Visit: Payer: Self-pay

## 2016-09-08 DIAGNOSIS — J019 Acute sinusitis, unspecified: Secondary | ICD-10-CM | POA: Diagnosis not present

## 2016-10-15 DIAGNOSIS — E785 Hyperlipidemia, unspecified: Secondary | ICD-10-CM | POA: Diagnosis not present

## 2016-10-15 DIAGNOSIS — Z79899 Other long term (current) drug therapy: Secondary | ICD-10-CM | POA: Diagnosis not present

## 2016-10-15 DIAGNOSIS — E039 Hypothyroidism, unspecified: Secondary | ICD-10-CM | POA: Diagnosis not present

## 2016-10-15 DIAGNOSIS — K219 Gastro-esophageal reflux disease without esophagitis: Secondary | ICD-10-CM | POA: Diagnosis not present

## 2016-10-15 DIAGNOSIS — E1140 Type 2 diabetes mellitus with diabetic neuropathy, unspecified: Secondary | ICD-10-CM | POA: Diagnosis not present

## 2016-11-12 ENCOUNTER — Encounter: Payer: Self-pay | Admitting: Family

## 2016-11-18 ENCOUNTER — Encounter (HOSPITAL_COMMUNITY): Payer: Medicare Other

## 2016-11-18 ENCOUNTER — Ambulatory Visit: Payer: Medicare Other | Admitting: Family

## 2016-11-20 ENCOUNTER — Encounter: Payer: Self-pay | Admitting: Family

## 2016-11-21 ENCOUNTER — Encounter: Payer: Self-pay | Admitting: Family

## 2016-11-21 ENCOUNTER — Ambulatory Visit (HOSPITAL_COMMUNITY)
Admission: RE | Admit: 2016-11-21 | Discharge: 2016-11-21 | Disposition: A | Discharge status: Home / Self Care | Payer: Medicare Other | Source: Ambulatory Visit | Attending: Family | Admitting: Family

## 2016-11-21 ENCOUNTER — Ambulatory Visit (INDEPENDENT_AMBULATORY_CARE_PROVIDER_SITE_OTHER): Payer: Medicare Other | Admitting: Family

## 2016-11-21 VITALS — BP 129/77 | HR 74 | Temp 98.4°F | Resp 18

## 2016-11-21 DIAGNOSIS — I6523 Occlusion and stenosis of bilateral carotid arteries: Secondary | ICD-10-CM

## 2016-11-21 DIAGNOSIS — Z87891 Personal history of nicotine dependence: Secondary | ICD-10-CM | POA: Diagnosis not present

## 2016-11-21 LAB — VAS US CAROTID
LEFT ECA DIAS: -5 cm/s
LEFT VERTEBRAL DIAS: -13 cm/s
LICAPDIAS: -37 cm/s
LICAPSYS: -147 cm/s
Left CCA dist dias: 26 cm/s
Left CCA dist sys: 118 cm/s
Left CCA prox dias: 17 cm/s
Left CCA prox sys: 98 cm/s
Left ICA dist dias: -36 cm/s
Left ICA dist sys: -103 cm/s
RCCAPSYS: 119 cm/s
RIGHT CCA MID DIAS: 16 cm/s
RIGHT ECA DIAS: -4 cm/s
RIGHT VERTEBRAL DIAS: -15 cm/s
Right cca dist sys: -107 cm/s

## 2016-11-21 NOTE — Patient Instructions (Signed)
Stroke Prevention Some medical conditions and behaviors are associated with an increased chance of having a stroke. You may prevent a stroke by making healthy choices and managing medical conditions. How can I reduce my risk of having a stroke?  Stay physically active. Get at least 30 minutes of activity on most or all days.  Do not smoke. It may also be helpful to avoid exposure to secondhand smoke.  Limit alcohol use. Moderate alcohol use is considered to be:  No more than 2 drinks per day for men.  No more than 1 drink per day for nonpregnant women.  Eat healthy foods. This involves:  Eating 5 or more servings of fruits and vegetables a day.  Making dietary changes that address high blood pressure (hypertension), high cholesterol, diabetes, or obesity.  Manage your cholesterol levels.  Making food choices that are high in fiber and low in saturated fat, trans fat, and cholesterol may control cholesterol levels.  Take any prescribed medicines to control cholesterol as directed by your health care provider.  Manage your diabetes.  Controlling your carbohydrate and sugar intake is recommended to manage diabetes.  Take any prescribed medicines to control diabetes as directed by your health care provider.  Control your hypertension.  Making food choices that are low in salt (sodium), saturated fat, trans fat, and cholesterol is recommended to manage hypertension.  Ask your health care provider if you need treatment to lower your blood pressure. Take any prescribed medicines to control hypertension as directed by your health care provider.  If you are 18-39 years of age, have your blood pressure checked every 3-5 years. If you are 40 years of age or older, have your blood pressure checked every year.  Maintain a healthy weight.  Reducing calorie intake and making food choices that are low in sodium, saturated fat, trans fat, and cholesterol are recommended to manage  weight.  Stop drug abuse.  Avoid taking birth control pills.  Talk to your health care provider about the risks of taking birth control pills if you are over 35 years old, smoke, get migraines, or have ever had a blood clot.  Get evaluated for sleep disorders (sleep apnea).  Talk to your health care provider about getting a sleep evaluation if you snore a lot or have excessive sleepiness.  Take medicines only as directed by your health care provider.  For some people, aspirin or blood thinners (anticoagulants) are helpful in reducing the risk of forming abnormal blood clots that can lead to stroke. If you have the irregular heart rhythm of atrial fibrillation, you should be on a blood thinner unless there is a good reason you cannot take them.  Understand all your medicine instructions.  Make sure that other conditions (such as anemia or atherosclerosis) are addressed. Get help right away if:  You have sudden weakness or numbness of the face, arm, or leg, especially on one side of the body.  Your face or eyelid droops to one side.  You have sudden confusion.  You have trouble speaking (aphasia) or understanding.  You have sudden trouble seeing in one or both eyes.  You have sudden trouble walking.  You have dizziness.  You have a loss of balance or coordination.  You have a sudden, severe headache with no known cause.  You have new chest pain or an irregular heartbeat. Any of these symptoms may represent a serious problem that is an emergency. Do not wait to see if the symptoms will go away.   Get medical help at once. Call your local emergency services (911 in [place] Do not drive yourself to the hospital. This information is not intended to replace advice given to you by your health care provider. Make sure you discuss any questions you have with your health care provider. Document Released: 01/01/2005 Document Revised: 05/01/2016 Document Reviewed: 05/27/2013 Elsevier  Interactive Patient Education  2017 Elsevier Inc.  
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
## 2016-11-25 NOTE — Addendum Note (Signed)
Addended by: Burton ApleyPETTY,  A on: 11/25/2016 11:51 AM   Modules accepted: Orders

## 2016-12-25 ENCOUNTER — Telehealth: Payer: Self-pay

## 2016-12-25 NOTE — Telephone Encounter (Signed)
Pt. called to inform nurse practitioner that she is not on Plavix, and questioned if she should be.  Stated she didn't know how Plavix was added to her medication list.  Denied having prev. Hx of stroke or stents placed.  Stated she stopped ASA 09/2015, due to an esophageal ulcer.  Advised will make NP aware of the above, and return call to her re: any recommendations.  Agreed.

## 2016-12-26 NOTE — Telephone Encounter (Signed)
Phone call to pt.  Given recommendation of NP re: discussing use of ASA or Plavix with her PCP, due to hx of GI Bleeding.  [facility] for their information of current status re: Carotid Disease.  Pt. agrees to contact her PCP.

## 2017-03-24 DIAGNOSIS — Z7984 Long term (current) use of oral hypoglycemic drugs: Secondary | ICD-10-CM | POA: Diagnosis not present

## 2017-03-24 DIAGNOSIS — Z79899 Other long term (current) drug therapy: Secondary | ICD-10-CM | POA: Diagnosis not present

## 2017-03-24 DIAGNOSIS — I6529 Occlusion and stenosis of unspecified carotid artery: Secondary | ICD-10-CM | POA: Diagnosis not present

## 2017-03-24 DIAGNOSIS — E785 Hyperlipidemia, unspecified: Secondary | ICD-10-CM | POA: Diagnosis not present

## 2017-03-24 DIAGNOSIS — E039 Hypothyroidism, unspecified: Secondary | ICD-10-CM | POA: Diagnosis not present

## 2017-03-24 DIAGNOSIS — E1140 Type 2 diabetes mellitus with diabetic neuropathy, unspecified: Secondary | ICD-10-CM | POA: Diagnosis not present

## 2017-04-21 DIAGNOSIS — R64 Cachexia: Secondary | ICD-10-CM | POA: Diagnosis not present

## 2017-04-21 DIAGNOSIS — Z6831 Body mass index (BMI) 31.0-31.9, adult: Secondary | ICD-10-CM | POA: Diagnosis not present

## 2017-04-21 DIAGNOSIS — K3184 Gastroparesis: Secondary | ICD-10-CM | POA: Diagnosis not present

## 2017-05-28 ENCOUNTER — Ambulatory Visit (HOSPITAL_COMMUNITY)
Admission: RE | Admit: 2017-05-28 | Discharge: 2017-05-28 | Disposition: A | Discharge status: Home / Self Care | Payer: Medicare Other | Source: Ambulatory Visit | Attending: Vascular Surgery | Admitting: Vascular Surgery

## 2017-05-28 ENCOUNTER — Encounter: Payer: Self-pay | Admitting: Family

## 2017-05-28 ENCOUNTER — Ambulatory Visit (INDEPENDENT_AMBULATORY_CARE_PROVIDER_SITE_OTHER): Payer: Medicare Other | Admitting: Family

## 2017-05-28 VITALS — BP 134/74 | HR 73 | Temp 98.7°F | Resp 20 | Ht 66.0 in | Wt 200.0 lb

## 2017-05-28 DIAGNOSIS — I6521 Occlusion and stenosis of right carotid artery: Secondary | ICD-10-CM | POA: Diagnosis not present

## 2017-05-28 DIAGNOSIS — Z87891 Personal history of nicotine dependence: Secondary | ICD-10-CM | POA: Diagnosis not present

## 2017-05-28 DIAGNOSIS — I6523 Occlusion and stenosis of bilateral carotid arteries: Secondary | ICD-10-CM | POA: Diagnosis not present
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
## 2017-05-28 NOTE — Patient Instructions (Signed)
Stroke Prevention Some medical conditions and behaviors are associated with an increased chance of having a stroke. You may prevent a stroke by making healthy choices and managing medical conditions. How can I reduce my risk of having a stroke?  Stay physically active. Get at least 30 minutes of activity on most or all days.  Do not smoke. It may also be helpful to avoid exposure to secondhand smoke.  Limit alcohol use. Moderate alcohol use is considered to be:  No more than 2 drinks per day for men.  No more than 1 drink per day for nonpregnant women.  Eat healthy foods. This involves:  Eating 5 or more servings of fruits and vegetables a day.  Making dietary changes that address high blood pressure (hypertension), high cholesterol, diabetes, or obesity.  Manage your cholesterol levels.  Making food choices that are high in fiber and low in saturated fat, trans fat, and cholesterol may control cholesterol levels.  Take any prescribed medicines to control cholesterol as directed by your health care provider.  Manage your diabetes.  Controlling your carbohydrate and sugar intake is recommended to manage diabetes.  Take any prescribed medicines to control diabetes as directed by your health care provider.  Control your hypertension.  Making food choices that are low in salt (sodium), saturated fat, trans fat, and cholesterol is recommended to manage hypertension.  Ask your health care provider if you need treatment to lower your blood pressure. Take any prescribed medicines to control hypertension as directed by your health care provider.  If you are 18-39 years of age, have your blood pressure checked every 3-5 years. If you are 40 years of age or older, have your blood pressure checked every year.  Maintain a healthy weight.  Reducing calorie intake and making food choices that are low in sodium, saturated fat, trans fat, and cholesterol are recommended to manage  weight.  Stop drug abuse.  Avoid taking birth control pills.  Talk to your health care provider about the risks of taking birth control pills if you are over 35 years old, smoke, get migraines, or have ever had a blood clot.  Get evaluated for sleep disorders (sleep apnea).  Talk to your health care provider about getting a sleep evaluation if you snore a lot or have excessive sleepiness.  Take medicines only as directed by your health care provider.  For some people, aspirin or blood thinners (anticoagulants) are helpful in reducing the risk of forming abnormal blood clots that can lead to stroke. If you have the irregular heart rhythm of atrial fibrillation, you should be on a blood thinner unless there is a good reason you cannot take them.  Understand all your medicine instructions.  Make sure that other conditions (such as anemia or atherosclerosis) are addressed. Get help right away if:  You have sudden weakness or numbness of the face, arm, or leg, especially on one side of the body.  Your face or eyelid droops to one side.  You have sudden confusion.  You have trouble speaking (aphasia) or understanding.  You have sudden trouble seeing in one or both eyes.  You have sudden trouble walking.  You have dizziness.  You have a loss of balance or coordination.  You have a sudden, severe headache with no known cause.  You have new chest pain or an irregular heartbeat. Any of these symptoms may represent a serious problem that is an emergency. Do not wait to see if the symptoms will go away.   Get medical help at once. Call your local emergency services (911 in [place] Do not drive yourself to the hospital. This information is not intended to replace advice given to you by your health care provider. Make sure you discuss any questions you have with your health care provider. Document Released: 01/01/2005 Document Revised: 05/01/2016 Document Reviewed: 05/27/2013 Elsevier  Interactive Patient Education  2017 Elsevier Inc.  

## 2017-05-29 NOTE — Addendum Note (Signed)
Addended by: Burton ApleyPETTY,  A on: 05/29/2017 10:18 AM   Modules accepted: Orders

## 2017-11-13 DIAGNOSIS — Z79899 Other long term (current) drug therapy: Secondary | ICD-10-CM | POA: Diagnosis not present

## 2017-11-13 DIAGNOSIS — Z23 Encounter for immunization: Secondary | ICD-10-CM | POA: Diagnosis not present

## 2017-11-13 DIAGNOSIS — E039 Hypothyroidism, unspecified: Secondary | ICD-10-CM | POA: Diagnosis not present

## 2017-11-13 DIAGNOSIS — E1140 Type 2 diabetes mellitus with diabetic neuropathy, unspecified: Secondary | ICD-10-CM | POA: Diagnosis not present

## 2017-11-13 DIAGNOSIS — K219 Gastro-esophageal reflux disease without esophagitis: Secondary | ICD-10-CM | POA: Diagnosis not present

## 2017-11-13 DIAGNOSIS — E785 Hyperlipidemia, unspecified: Secondary | ICD-10-CM | POA: Diagnosis not present

## 2017-12-14 ENCOUNTER — Ambulatory Visit (INDEPENDENT_AMBULATORY_CARE_PROVIDER_SITE_OTHER): Payer: Medicare Other | Admitting: Family

## 2017-12-14 ENCOUNTER — Ambulatory Visit (HOSPITAL_COMMUNITY)
Admission: RE | Admit: 2017-12-14 | Discharge: 2017-12-14 | Disposition: A | Discharge status: Home / Self Care | Payer: Medicare Other | Source: Ambulatory Visit | Attending: Surgery | Admitting: Surgery

## 2017-12-14 ENCOUNTER — Encounter: Payer: Self-pay | Admitting: Family

## 2017-12-14 VITALS — BP 148/74 | HR 65 | Temp 97.2°F | Resp 20 | Ht 66.0 in | Wt 205.1 lb

## 2017-12-14 DIAGNOSIS — Z87891 Personal history of nicotine dependence: Secondary | ICD-10-CM

## 2017-12-14 DIAGNOSIS — I6521 Occlusion and stenosis of right carotid artery: Secondary | ICD-10-CM | POA: Diagnosis not present

## 2017-12-14 LAB — VAS US CAROTID
LCCADSYS: -111 cm/s
LCCAPSYS: 152 cm/s
LEFT ECA DIAS: -13 cm/s
LEFT VERTEBRAL DIAS: 11 cm/s
LICADDIAS: -35 cm/s
LICADSYS: -134 cm/s
Left CCA dist dias: -19 cm/s
Left CCA prox dias: 25 cm/s
Left ICA prox dias: -35 cm/s
Left ICA prox sys: -142 cm/s
RCCADSYS: -111 cm/s
RIGHT CCA MID DIAS: 28 cm/s
RIGHT ECA DIAS: -10 cm/s
RIGHT VERTEBRAL DIAS: -14 cm/s
Right CCA prox dias: 19 cm/s
Right CCA prox sys: 132 cm/s
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
## 2017-12-14 NOTE — Patient Instructions (Signed)

## 2017-12-16 NOTE — Addendum Note (Signed)
Addended by: Burton ApleyPETTY,  A on: 12/16/2017 12:09 PM   Modules accepted: Orders

## 2018-06-28 DIAGNOSIS — Z0000 Encounter for general adult medical examination without abnormal findings: Secondary | ICD-10-CM | POA: Diagnosis not present

## 2018-06-28 DIAGNOSIS — E039 Hypothyroidism, unspecified: Secondary | ICD-10-CM | POA: Diagnosis not present

## 2018-06-28 DIAGNOSIS — E1140 Type 2 diabetes mellitus with diabetic neuropathy, unspecified: Secondary | ICD-10-CM | POA: Diagnosis not present

## 2018-06-28 DIAGNOSIS — I6529 Occlusion and stenosis of unspecified carotid artery: Secondary | ICD-10-CM | POA: Diagnosis not present

## 2018-06-28 DIAGNOSIS — Z79899 Other long term (current) drug therapy: Secondary | ICD-10-CM | POA: Diagnosis not present

## 2018-06-28 DIAGNOSIS — E785 Hyperlipidemia, unspecified: Secondary | ICD-10-CM | POA: Diagnosis not present

## 2018-09-13 ENCOUNTER — Ambulatory Visit (INDEPENDENT_AMBULATORY_CARE_PROVIDER_SITE_OTHER): Payer: Medicare Other | Admitting: Family

## 2018-09-13 ENCOUNTER — Other Ambulatory Visit: Payer: Self-pay

## 2018-09-13 ENCOUNTER — Ambulatory Visit (HOSPITAL_COMMUNITY)
Admission: RE | Admit: 2018-09-13 | Discharge: 2018-09-13 | Disposition: A | Discharge status: Home / Self Care | Payer: Medicare Other | Source: Ambulatory Visit | Attending: Family | Admitting: Family

## 2018-09-13 ENCOUNTER — Encounter: Payer: Self-pay | Admitting: Family

## 2018-09-13 VITALS — BP 119/69 | HR 64 | Temp 97.7°F | Resp 16 | Ht 67.0 in | Wt 207.0 lb

## 2018-09-13 DIAGNOSIS — Z87891 Personal history of nicotine dependence: Secondary | ICD-10-CM | POA: Diagnosis not present

## 2018-09-13 DIAGNOSIS — I6523 Occlusion and stenosis of bilateral carotid arteries: Secondary | ICD-10-CM | POA: Diagnosis not present

## 2018-09-13 DIAGNOSIS — I6521 Occlusion and stenosis of right carotid artery: Secondary | ICD-10-CM | POA: Diagnosis not present
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
## 2018-10-27 ENCOUNTER — Other Ambulatory Visit: Payer: Self-pay

## 2019-01-12 DIAGNOSIS — E785 Hyperlipidemia, unspecified: Secondary | ICD-10-CM | POA: Diagnosis not present

## 2019-01-12 DIAGNOSIS — I6529 Occlusion and stenosis of unspecified carotid artery: Secondary | ICD-10-CM | POA: Diagnosis not present

## 2019-01-12 DIAGNOSIS — E1140 Type 2 diabetes mellitus with diabetic neuropathy, unspecified: Secondary | ICD-10-CM | POA: Diagnosis not present

## 2019-01-12 DIAGNOSIS — Z79899 Other long term (current) drug therapy: Secondary | ICD-10-CM | POA: Diagnosis not present

## 2019-01-12 DIAGNOSIS — E039 Hypothyroidism, unspecified: Secondary | ICD-10-CM | POA: Diagnosis not present

## 2019-03-14 ENCOUNTER — Ambulatory Visit: Payer: Medicare Other | Admitting: Family

## 2019-03-14 ENCOUNTER — Inpatient Hospital Stay (HOSPITAL_COMMUNITY): Admission: RE | Admit: 2019-03-14 | Payer: Medicare Other | Source: Ambulatory Visit

## 2019-05-23 ENCOUNTER — Other Ambulatory Visit: Payer: Self-pay

## 2019-05-23 DIAGNOSIS — I6523 Occlusion and stenosis of bilateral carotid arteries: Secondary | ICD-10-CM

## 2019-05-27 ENCOUNTER — Ambulatory Visit (HOSPITAL_COMMUNITY)
Admission: RE | Admit: 2019-05-27 | Discharge: 2019-05-27 | Disposition: A | Discharge status: Home / Self Care | Payer: Medicare Other | Source: Ambulatory Visit | Attending: Family | Admitting: Family

## 2019-05-27 ENCOUNTER — Ambulatory Visit (INDEPENDENT_AMBULATORY_CARE_PROVIDER_SITE_OTHER): Payer: Medicare Other | Admitting: Family

## 2019-05-27 ENCOUNTER — Encounter: Payer: Self-pay | Admitting: Family

## 2019-05-27 ENCOUNTER — Other Ambulatory Visit: Payer: Self-pay

## 2019-05-27 VITALS — BP 141/72 | HR 73 | Temp 97.3°F | Resp 16 | Ht 67.0 in | Wt 200.0 lb

## 2019-05-27 DIAGNOSIS — I6523 Occlusion and stenosis of bilateral carotid arteries: Secondary | ICD-10-CM | POA: Diagnosis not present

## 2019-05-27 DIAGNOSIS — Z87891 Personal history of nicotine dependence: Secondary | ICD-10-CM | POA: Diagnosis not present

## 2019-05-27 NOTE — Patient Instructions (Signed)
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
## 2019-06-07 DIAGNOSIS — F331 Major depressive disorder, recurrent, moderate: Secondary | ICD-10-CM | POA: Diagnosis not present

## 2019-06-07 DIAGNOSIS — E039 Hypothyroidism, unspecified: Secondary | ICD-10-CM | POA: Diagnosis not present

## 2019-08-08 DIAGNOSIS — M549 Dorsalgia, unspecified: Secondary | ICD-10-CM | POA: Diagnosis not present

## 2019-08-08 DIAGNOSIS — F3341 Major depressive disorder, recurrent, in partial remission: Secondary | ICD-10-CM | POA: Diagnosis not present

## 2019-08-08 DIAGNOSIS — K219 Gastro-esophageal reflux disease without esophagitis: Secondary | ICD-10-CM | POA: Diagnosis not present

## 2019-08-18 DIAGNOSIS — I6529 Occlusion and stenosis of unspecified carotid artery: Secondary | ICD-10-CM | POA: Diagnosis not present

## 2019-08-18 DIAGNOSIS — Z23 Encounter for immunization: Secondary | ICD-10-CM | POA: Diagnosis not present

## 2019-08-18 DIAGNOSIS — E785 Hyperlipidemia, unspecified: Secondary | ICD-10-CM | POA: Diagnosis not present

## 2019-08-18 DIAGNOSIS — Z0000 Encounter for general adult medical examination without abnormal findings: Secondary | ICD-10-CM | POA: Diagnosis not present

## 2019-08-18 DIAGNOSIS — Z79899 Other long term (current) drug therapy: Secondary | ICD-10-CM | POA: Diagnosis not present

## 2019-08-18 DIAGNOSIS — E559 Vitamin D deficiency, unspecified: Secondary | ICD-10-CM | POA: Diagnosis not present

## 2019-08-18 DIAGNOSIS — E039 Hypothyroidism, unspecified: Secondary | ICD-10-CM | POA: Diagnosis not present

## 2019-08-18 DIAGNOSIS — E1140 Type 2 diabetes mellitus with diabetic neuropathy, unspecified: Secondary | ICD-10-CM | POA: Diagnosis not present

## 2019-08-26 DIAGNOSIS — I10 Essential (primary) hypertension: Secondary | ICD-10-CM | POA: Diagnosis not present

## 2019-08-26 DIAGNOSIS — M25462 Effusion, left knee: Secondary | ICD-10-CM | POA: Diagnosis not present

## 2019-08-26 DIAGNOSIS — S8392XA Sprain of unspecified site of left knee, initial encounter: Secondary | ICD-10-CM | POA: Diagnosis not present

## 2019-08-26 DIAGNOSIS — M25562 Pain in left knee: Secondary | ICD-10-CM | POA: Diagnosis not present

## 2019-09-01 DIAGNOSIS — E1140 Type 2 diabetes mellitus with diabetic neuropathy, unspecified: Secondary | ICD-10-CM | POA: Diagnosis not present

## 2019-09-07 DIAGNOSIS — E1140 Type 2 diabetes mellitus with diabetic neuropathy, unspecified: Secondary | ICD-10-CM | POA: Diagnosis not present

## 2019-09-07 DIAGNOSIS — E785 Hyperlipidemia, unspecified: Secondary | ICD-10-CM | POA: Diagnosis not present

## 2019-10-07 DIAGNOSIS — K219 Gastro-esophageal reflux disease without esophagitis: Secondary | ICD-10-CM | POA: Diagnosis not present

## 2019-10-07 DIAGNOSIS — E1140 Type 2 diabetes mellitus with diabetic neuropathy, unspecified: Secondary | ICD-10-CM | POA: Diagnosis not present

## 2019-11-01 ENCOUNTER — Other Ambulatory Visit: Payer: Self-pay

## 2020-01-16 DIAGNOSIS — Z79899 Other long term (current) drug therapy: Secondary | ICD-10-CM | POA: Diagnosis not present

## 2020-01-16 DIAGNOSIS — E039 Hypothyroidism, unspecified: Secondary | ICD-10-CM | POA: Diagnosis not present

## 2020-01-16 DIAGNOSIS — E119 Type 2 diabetes mellitus without complications: Secondary | ICD-10-CM | POA: Diagnosis not present

## 2020-01-16 DIAGNOSIS — E559 Vitamin D deficiency, unspecified: Secondary | ICD-10-CM | POA: Diagnosis not present

## 2020-02-05 DIAGNOSIS — E785 Hyperlipidemia, unspecified: Secondary | ICD-10-CM | POA: Diagnosis not present

## 2020-02-05 DIAGNOSIS — E1140 Type 2 diabetes mellitus with diabetic neuropathy, unspecified: Secondary | ICD-10-CM | POA: Diagnosis not present

## 2020-02-16 DIAGNOSIS — E113293 Type 2 diabetes mellitus with mild nonproliferative diabetic retinopathy without macular edema, bilateral: Secondary | ICD-10-CM | POA: Diagnosis not present

## 2020-02-16 DIAGNOSIS — R262 Difficulty in walking, not elsewhere classified: Secondary | ICD-10-CM | POA: Diagnosis not present

## 2020-02-16 DIAGNOSIS — E1140 Type 2 diabetes mellitus with diabetic neuropathy, unspecified: Secondary | ICD-10-CM | POA: Diagnosis not present

## 2020-02-16 DIAGNOSIS — M549 Dorsalgia, unspecified: Secondary | ICD-10-CM | POA: Diagnosis not present

## 2020-03-07 DIAGNOSIS — E785 Hyperlipidemia, unspecified: Secondary | ICD-10-CM | POA: Diagnosis not present

## 2020-03-07 DIAGNOSIS — K219 Gastro-esophageal reflux disease without esophagitis: Secondary | ICD-10-CM | POA: Diagnosis not present

## 2020-03-15 ENCOUNTER — Other Ambulatory Visit: Payer: Self-pay | Admitting: *Deleted

## 2020-03-15 DIAGNOSIS — I6523 Occlusion and stenosis of bilateral carotid arteries: Secondary | ICD-10-CM

## 2020-03-19 ENCOUNTER — Ambulatory Visit (HOSPITAL_COMMUNITY)
Admission: RE | Admit: 2020-03-19 | Discharge: 2020-03-19 | Disposition: A | Discharge status: Home / Self Care | Payer: Medicare Other | Source: Ambulatory Visit | Attending: Vascular Surgery | Admitting: Vascular Surgery

## 2020-03-19 ENCOUNTER — Ambulatory Visit (INDEPENDENT_AMBULATORY_CARE_PROVIDER_SITE_OTHER): Payer: Medicare Other | Admitting: Physician Assistant

## 2020-03-19 ENCOUNTER — Other Ambulatory Visit: Payer: Self-pay

## 2020-03-19 VITALS — BP 159/82 | HR 78 | Temp 97.0°F | Ht 67.0 in | Wt 203.0 lb

## 2020-03-19 DIAGNOSIS — I6523 Occlusion and stenosis of bilateral carotid arteries: Secondary | ICD-10-CM
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
## 2020-03-20 ENCOUNTER — Other Ambulatory Visit: Payer: Self-pay | Admitting: *Deleted

## 2020-03-20 DIAGNOSIS — I6523 Occlusion and stenosis of bilateral carotid arteries: Secondary | ICD-10-CM

## 2020-05-01 DIAGNOSIS — Z79899 Other long term (current) drug therapy: Secondary | ICD-10-CM | POA: Diagnosis not present

## 2020-05-01 DIAGNOSIS — E119 Type 2 diabetes mellitus without complications: Secondary | ICD-10-CM | POA: Diagnosis not present

## 2020-05-07 DIAGNOSIS — E785 Hyperlipidemia, unspecified: Secondary | ICD-10-CM | POA: Diagnosis not present

## 2020-05-07 DIAGNOSIS — K219 Gastro-esophageal reflux disease without esophagitis: Secondary | ICD-10-CM | POA: Diagnosis not present

## 2020-05-07 DIAGNOSIS — E039 Hypothyroidism, unspecified: Secondary | ICD-10-CM | POA: Diagnosis not present

## 2020-05-07 DIAGNOSIS — E1140 Type 2 diabetes mellitus with diabetic neuropathy, unspecified: Secondary | ICD-10-CM | POA: Diagnosis not present

## 2020-06-06 DIAGNOSIS — K219 Gastro-esophageal reflux disease without esophagitis: Secondary | ICD-10-CM | POA: Diagnosis not present

## 2020-06-06 DIAGNOSIS — E1140 Type 2 diabetes mellitus with diabetic neuropathy, unspecified: Secondary | ICD-10-CM | POA: Diagnosis not present

## 2020-06-06 DIAGNOSIS — E785 Hyperlipidemia, unspecified: Secondary | ICD-10-CM | POA: Diagnosis not present

## 2020-06-06 DIAGNOSIS — E039 Hypothyroidism, unspecified: Secondary | ICD-10-CM | POA: Diagnosis not present

## 2020-07-07 DIAGNOSIS — E039 Hypothyroidism, unspecified: Secondary | ICD-10-CM | POA: Diagnosis not present

## 2020-07-07 DIAGNOSIS — E785 Hyperlipidemia, unspecified: Secondary | ICD-10-CM | POA: Diagnosis not present

## 2020-07-07 DIAGNOSIS — K219 Gastro-esophageal reflux disease without esophagitis: Secondary | ICD-10-CM | POA: Diagnosis not present

## 2020-07-07 DIAGNOSIS — E1140 Type 2 diabetes mellitus with diabetic neuropathy, unspecified: Secondary | ICD-10-CM | POA: Diagnosis not present

## 2020-08-07 DIAGNOSIS — M159 Polyosteoarthritis, unspecified: Secondary | ICD-10-CM | POA: Diagnosis not present

## 2020-08-07 DIAGNOSIS — M549 Dorsalgia, unspecified: Secondary | ICD-10-CM | POA: Diagnosis not present

## 2020-08-07 DIAGNOSIS — N390 Urinary tract infection, site not specified: Secondary | ICD-10-CM | POA: Diagnosis not present

## 2020-08-07 DIAGNOSIS — A499 Bacterial infection, unspecified: Secondary | ICD-10-CM | POA: Diagnosis not present

## 2020-08-27 DIAGNOSIS — M25512 Pain in left shoulder: Secondary | ICD-10-CM | POA: Diagnosis not present

## 2020-09-06 DIAGNOSIS — K219 Gastro-esophageal reflux disease without esophagitis: Secondary | ICD-10-CM | POA: Diagnosis not present

## 2020-09-06 DIAGNOSIS — E039 Hypothyroidism, unspecified: Secondary | ICD-10-CM | POA: Diagnosis not present

## 2020-09-06 DIAGNOSIS — E785 Hyperlipidemia, unspecified: Secondary | ICD-10-CM | POA: Diagnosis not present

## 2020-09-06 DIAGNOSIS — E1140 Type 2 diabetes mellitus with diabetic neuropathy, unspecified: Secondary | ICD-10-CM | POA: Diagnosis not present

## 2020-10-06 DIAGNOSIS — E785 Hyperlipidemia, unspecified: Secondary | ICD-10-CM | POA: Diagnosis not present

## 2020-10-06 DIAGNOSIS — E039 Hypothyroidism, unspecified: Secondary | ICD-10-CM | POA: Diagnosis not present

## 2020-10-06 DIAGNOSIS — K219 Gastro-esophageal reflux disease without esophagitis: Secondary | ICD-10-CM | POA: Diagnosis not present

## 2020-10-06 DIAGNOSIS — E1140 Type 2 diabetes mellitus with diabetic neuropathy, unspecified: Secondary | ICD-10-CM | POA: Diagnosis not present

## 2020-10-29 ENCOUNTER — Other Ambulatory Visit: Payer: Self-pay | Admitting: *Deleted

## 2020-10-29 DIAGNOSIS — I6523 Occlusion and stenosis of bilateral carotid arteries: Secondary | ICD-10-CM

## 2020-11-07 ENCOUNTER — Ambulatory Visit (HOSPITAL_COMMUNITY)
Admission: RE | Admit: 2020-11-07 | Discharge: 2020-11-07 | Disposition: A | Discharge status: Home / Self Care | Payer: Medicare Other | Source: Ambulatory Visit | Attending: Physician Assistant | Admitting: Physician Assistant

## 2020-11-07 ENCOUNTER — Other Ambulatory Visit: Payer: Self-pay

## 2020-11-07 ENCOUNTER — Ambulatory Visit (INDEPENDENT_AMBULATORY_CARE_PROVIDER_SITE_OTHER): Payer: Medicare Other | Admitting: Physician Assistant

## 2020-11-07 VITALS — BP 100/70 | HR 68 | Temp 98.1°F | Resp 20 | Ht 67.0 in | Wt 200.0 lb

## 2020-11-07 DIAGNOSIS — I6523 Occlusion and stenosis of bilateral carotid arteries: Secondary | ICD-10-CM | POA: Diagnosis not present
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
## 2020-11-14 DIAGNOSIS — E039 Hypothyroidism, unspecified: Secondary | ICD-10-CM | POA: Diagnosis not present

## 2020-11-14 DIAGNOSIS — Z79899 Other long term (current) drug therapy: Secondary | ICD-10-CM | POA: Diagnosis not present

## 2020-11-14 DIAGNOSIS — E1140 Type 2 diabetes mellitus with diabetic neuropathy, unspecified: Secondary | ICD-10-CM | POA: Diagnosis not present

## 2020-11-14 DIAGNOSIS — E785 Hyperlipidemia, unspecified: Secondary | ICD-10-CM | POA: Diagnosis not present

## 2020-11-19 DIAGNOSIS — E1140 Type 2 diabetes mellitus with diabetic neuropathy, unspecified: Secondary | ICD-10-CM | POA: Diagnosis not present

## 2020-11-19 DIAGNOSIS — E113293 Type 2 diabetes mellitus with mild nonproliferative diabetic retinopathy without macular edema, bilateral: Secondary | ICD-10-CM | POA: Diagnosis not present

## 2020-11-19 DIAGNOSIS — E785 Hyperlipidemia, unspecified: Secondary | ICD-10-CM | POA: Diagnosis not present

## 2020-11-19 DIAGNOSIS — E039 Hypothyroidism, unspecified: Secondary | ICD-10-CM | POA: Diagnosis not present

## 2020-11-19 DIAGNOSIS — Z0000 Encounter for general adult medical examination without abnormal findings: Secondary | ICD-10-CM | POA: Diagnosis not present

## 2020-11-19 DIAGNOSIS — Z23 Encounter for immunization: Secondary | ICD-10-CM | POA: Diagnosis not present

## 2021-04-06 DIAGNOSIS — E039 Hypothyroidism, unspecified: Secondary | ICD-10-CM | POA: Diagnosis not present

## 2021-04-06 DIAGNOSIS — K219 Gastro-esophageal reflux disease without esophagitis: Secondary | ICD-10-CM | POA: Diagnosis not present

## 2021-04-06 DIAGNOSIS — E1140 Type 2 diabetes mellitus with diabetic neuropathy, unspecified: Secondary | ICD-10-CM | POA: Diagnosis not present

## 2021-04-06 DIAGNOSIS — E785 Hyperlipidemia, unspecified: Secondary | ICD-10-CM | POA: Diagnosis not present

## 2021-04-11 DIAGNOSIS — E113293 Type 2 diabetes mellitus with mild nonproliferative diabetic retinopathy without macular edema, bilateral: Secondary | ICD-10-CM | POA: Diagnosis not present

## 2021-04-11 DIAGNOSIS — E1140 Type 2 diabetes mellitus with diabetic neuropathy, unspecified: Secondary | ICD-10-CM | POA: Diagnosis not present

## 2021-04-11 DIAGNOSIS — M1991 Primary osteoarthritis, unspecified site: Secondary | ICD-10-CM | POA: Diagnosis not present

## 2021-04-11 DIAGNOSIS — G8929 Other chronic pain: Secondary | ICD-10-CM | POA: Diagnosis not present

## 2021-04-11 DIAGNOSIS — E039 Hypothyroidism, unspecified: Secondary | ICD-10-CM | POA: Diagnosis not present

## 2021-04-11 DIAGNOSIS — E669 Obesity, unspecified: Secondary | ICD-10-CM | POA: Diagnosis not present

## 2021-04-11 DIAGNOSIS — M5441 Lumbago with sciatica, right side: Secondary | ICD-10-CM | POA: Diagnosis not present

## 2021-04-11 DIAGNOSIS — Z79899 Other long term (current) drug therapy: Secondary | ICD-10-CM | POA: Diagnosis not present

## 2021-04-11 DIAGNOSIS — M5442 Lumbago with sciatica, left side: Secondary | ICD-10-CM | POA: Diagnosis not present

## 2021-04-11 DIAGNOSIS — B370 Candidal stomatitis: Secondary | ICD-10-CM | POA: Diagnosis not present

## 2021-04-11 DIAGNOSIS — F331 Major depressive disorder, recurrent, moderate: Secondary | ICD-10-CM | POA: Diagnosis not present

## 2021-04-11 DIAGNOSIS — B3781 Candidal esophagitis: Secondary | ICD-10-CM | POA: Diagnosis not present

## 2021-04-12 ENCOUNTER — Other Ambulatory Visit: Payer: Self-pay

## 2021-04-12 DIAGNOSIS — I6523 Occlusion and stenosis of bilateral carotid arteries: Secondary | ICD-10-CM

## 2021-05-07 ENCOUNTER — Ambulatory Visit (HOSPITAL_COMMUNITY): Payer: PPO

## 2021-05-07 ENCOUNTER — Ambulatory Visit: Payer: PPO

## 2021-05-07 DIAGNOSIS — K2960 Other gastritis without bleeding: Secondary | ICD-10-CM | POA: Diagnosis not present

## 2021-05-07 DIAGNOSIS — K529 Noninfective gastroenteritis and colitis, unspecified: Secondary | ICD-10-CM | POA: Diagnosis not present

## 2021-05-08 DIAGNOSIS — R197 Diarrhea, unspecified: Secondary | ICD-10-CM | POA: Diagnosis not present

## 2021-05-08 DIAGNOSIS — R195 Other fecal abnormalities: Secondary | ICD-10-CM | POA: Diagnosis not present

## 2021-05-08 DIAGNOSIS — R198 Other specified symptoms and signs involving the digestive system and abdomen: Secondary | ICD-10-CM | POA: Diagnosis not present

## 2021-05-10 ENCOUNTER — Other Ambulatory Visit: Payer: Self-pay | Admitting: *Deleted

## 2021-05-10 DIAGNOSIS — I6523 Occlusion and stenosis of bilateral carotid arteries: Secondary | ICD-10-CM

## 2021-05-28 ENCOUNTER — Encounter (HOSPITAL_COMMUNITY): Payer: PPO

## 2021-05-28 ENCOUNTER — Ambulatory Visit: Payer: PPO

## 2021-06-06 DIAGNOSIS — E039 Hypothyroidism, unspecified: Secondary | ICD-10-CM | POA: Diagnosis not present

## 2021-06-06 DIAGNOSIS — E785 Hyperlipidemia, unspecified: Secondary | ICD-10-CM | POA: Diagnosis not present

## 2021-06-06 DIAGNOSIS — K219 Gastro-esophageal reflux disease without esophagitis: Secondary | ICD-10-CM | POA: Diagnosis not present

## 2021-06-06 DIAGNOSIS — E1140 Type 2 diabetes mellitus with diabetic neuropathy, unspecified: Secondary | ICD-10-CM | POA: Diagnosis not present

## 2021-06-12 DIAGNOSIS — E039 Hypothyroidism, unspecified: Secondary | ICD-10-CM | POA: Diagnosis not present

## 2021-06-12 DIAGNOSIS — M47816 Spondylosis without myelopathy or radiculopathy, lumbar region: Secondary | ICD-10-CM | POA: Diagnosis not present

## 2021-06-12 DIAGNOSIS — R55 Syncope and collapse: Secondary | ICD-10-CM | POA: Diagnosis not present

## 2021-06-12 DIAGNOSIS — N83201 Unspecified ovarian cyst, right side: Secondary | ICD-10-CM | POA: Diagnosis not present

## 2021-06-12 DIAGNOSIS — R7401 Elevation of levels of liver transaminase levels: Secondary | ICD-10-CM | POA: Diagnosis not present

## 2021-06-12 DIAGNOSIS — Z8719 Personal history of other diseases of the digestive system: Secondary | ICD-10-CM | POA: Diagnosis not present

## 2021-06-12 DIAGNOSIS — Z87891 Personal history of nicotine dependence: Secondary | ICD-10-CM | POA: Diagnosis not present

## 2021-06-12 DIAGNOSIS — R739 Hyperglycemia, unspecified: Secondary | ICD-10-CM | POA: Diagnosis not present

## 2021-06-12 DIAGNOSIS — Z79891 Long term (current) use of opiate analgesic: Secondary | ICD-10-CM | POA: Diagnosis not present

## 2021-06-12 DIAGNOSIS — Z86711 Personal history of pulmonary embolism: Secondary | ICD-10-CM | POA: Diagnosis not present

## 2021-06-12 DIAGNOSIS — R7989 Other specified abnormal findings of blood chemistry: Secondary | ICD-10-CM | POA: Diagnosis not present

## 2021-06-12 DIAGNOSIS — K219 Gastro-esophageal reflux disease without esophagitis: Secondary | ICD-10-CM | POA: Diagnosis not present

## 2021-06-12 DIAGNOSIS — R109 Unspecified abdominal pain: Secondary | ICD-10-CM | POA: Diagnosis not present

## 2021-06-12 DIAGNOSIS — E876 Hypokalemia: Secondary | ICD-10-CM | POA: Diagnosis not present

## 2021-06-12 DIAGNOSIS — N179 Acute kidney failure, unspecified: Secondary | ICD-10-CM | POA: Diagnosis not present

## 2021-06-12 DIAGNOSIS — A419 Sepsis, unspecified organism: Secondary | ICD-10-CM | POA: Diagnosis not present

## 2021-06-12 DIAGNOSIS — J9811 Atelectasis: Secondary | ICD-10-CM | POA: Diagnosis not present

## 2021-06-12 DIAGNOSIS — R1084 Generalized abdominal pain: Secondary | ICD-10-CM | POA: Diagnosis not present

## 2021-06-12 DIAGNOSIS — I700 Atherosclerosis of aorta: Secondary | ICD-10-CM | POA: Diagnosis not present

## 2021-06-12 DIAGNOSIS — I2510 Atherosclerotic heart disease of native coronary artery without angina pectoris: Secondary | ICD-10-CM | POA: Diagnosis not present

## 2021-06-12 DIAGNOSIS — Z4682 Encounter for fitting and adjustment of non-vascular catheter: Secondary | ICD-10-CM | POA: Diagnosis not present

## 2021-06-12 DIAGNOSIS — E119 Type 2 diabetes mellitus without complications: Secondary | ICD-10-CM | POA: Diagnosis not present

## 2021-06-12 DIAGNOSIS — J984 Other disorders of lung: Secondary | ICD-10-CM | POA: Diagnosis not present

## 2021-06-12 DIAGNOSIS — Z7984 Long term (current) use of oral hypoglycemic drugs: Secondary | ICD-10-CM | POA: Diagnosis not present

## 2021-06-12 DIAGNOSIS — G9340 Encephalopathy, unspecified: Secondary | ICD-10-CM | POA: Diagnosis not present

## 2021-06-12 DIAGNOSIS — R4182 Altered mental status, unspecified: Secondary | ICD-10-CM | POA: Diagnosis not present

## 2021-06-12 DIAGNOSIS — R404 Transient alteration of awareness: Secondary | ICD-10-CM | POA: Diagnosis not present

## 2021-06-12 DIAGNOSIS — R778 Other specified abnormalities of plasma proteins: Secondary | ICD-10-CM | POA: Diagnosis not present

## 2021-06-12 DIAGNOSIS — R6521 Severe sepsis with septic shock: Secondary | ICD-10-CM | POA: Diagnosis not present

## 2021-06-12 DIAGNOSIS — M1990 Unspecified osteoarthritis, unspecified site: Secondary | ICD-10-CM | POA: Diagnosis not present

## 2021-06-12 DIAGNOSIS — I471 Supraventricular tachycardia: Secondary | ICD-10-CM | POA: Diagnosis not present

## 2021-06-12 DIAGNOSIS — R791 Abnormal coagulation profile: Secondary | ICD-10-CM | POA: Diagnosis not present

## 2021-06-13 DIAGNOSIS — R55 Syncope and collapse: Secondary | ICD-10-CM | POA: Diagnosis not present

## 2021-06-13 DIAGNOSIS — R059 Cough, unspecified: Secondary | ICD-10-CM | POA: Diagnosis not present

## 2021-06-13 DIAGNOSIS — G8929 Other chronic pain: Secondary | ICD-10-CM | POA: Diagnosis not present

## 2021-06-13 DIAGNOSIS — E785 Hyperlipidemia, unspecified: Secondary | ICD-10-CM | POA: Diagnosis not present

## 2021-06-13 DIAGNOSIS — J9690 Respiratory failure, unspecified, unspecified whether with hypoxia or hypercapnia: Secondary | ICD-10-CM | POA: Diagnosis not present

## 2021-06-13 DIAGNOSIS — R1310 Dysphagia, unspecified: Secondary | ICD-10-CM | POA: Diagnosis not present

## 2021-06-13 DIAGNOSIS — G9341 Metabolic encephalopathy: Secondary | ICD-10-CM | POA: Diagnosis not present

## 2021-06-13 DIAGNOSIS — Z993 Dependence on wheelchair: Secondary | ICD-10-CM | POA: Diagnosis not present

## 2021-06-13 DIAGNOSIS — R188 Other ascites: Secondary | ICD-10-CM | POA: Diagnosis not present

## 2021-06-13 DIAGNOSIS — I348 Other nonrheumatic mitral valve disorders: Secondary | ICD-10-CM | POA: Diagnosis not present

## 2021-06-13 DIAGNOSIS — I358 Other nonrheumatic aortic valve disorders: Secondary | ICD-10-CM | POA: Diagnosis not present

## 2021-06-13 DIAGNOSIS — K8689 Other specified diseases of pancreas: Secondary | ICD-10-CM | POA: Diagnosis not present

## 2021-06-13 DIAGNOSIS — K55029 Acute infarction of small intestine, extent unspecified: Secondary | ICD-10-CM | POA: Diagnosis not present

## 2021-06-13 DIAGNOSIS — Z4682 Encounter for fitting and adjustment of non-vascular catheter: Secondary | ICD-10-CM | POA: Diagnosis not present

## 2021-06-13 DIAGNOSIS — R109 Unspecified abdominal pain: Secondary | ICD-10-CM | POA: Diagnosis not present

## 2021-06-13 DIAGNOSIS — E872 Acidosis: Secondary | ICD-10-CM | POA: Diagnosis not present

## 2021-06-13 DIAGNOSIS — J810 Acute pulmonary edema: Secondary | ICD-10-CM | POA: Diagnosis not present

## 2021-06-13 DIAGNOSIS — T17398A Other foreign object in larynx causing other injury, initial encounter: Secondary | ICD-10-CM | POA: Diagnosis not present

## 2021-06-13 DIAGNOSIS — E669 Obesity, unspecified: Secondary | ICD-10-CM | POA: Diagnosis not present

## 2021-06-13 DIAGNOSIS — M6281 Muscle weakness (generalized): Secondary | ICD-10-CM | POA: Diagnosis not present

## 2021-06-13 DIAGNOSIS — K55041 Focal (segmental) acute infarction of large intestine: Secondary | ICD-10-CM | POA: Diagnosis not present

## 2021-06-13 DIAGNOSIS — E639 Nutritional deficiency, unspecified: Secondary | ICD-10-CM | POA: Diagnosis not present

## 2021-06-13 DIAGNOSIS — I469 Cardiac arrest, cause unspecified: Secondary | ICD-10-CM | POA: Diagnosis not present

## 2021-06-13 DIAGNOSIS — A419 Sepsis, unspecified organism: Secondary | ICD-10-CM | POA: Diagnosis not present

## 2021-06-13 DIAGNOSIS — I10 Essential (primary) hypertension: Secondary | ICD-10-CM | POA: Diagnosis not present

## 2021-06-13 DIAGNOSIS — R000 Tachycardia, unspecified: Secondary | ICD-10-CM | POA: Diagnosis not present

## 2021-06-13 DIAGNOSIS — N179 Acute kidney failure, unspecified: Secondary | ICD-10-CM | POA: Diagnosis not present

## 2021-06-13 DIAGNOSIS — Z452 Encounter for adjustment and management of vascular access device: Secondary | ICD-10-CM | POA: Diagnosis not present

## 2021-06-13 DIAGNOSIS — K6389 Other specified diseases of intestine: Secondary | ICD-10-CM | POA: Diagnosis not present

## 2021-06-13 DIAGNOSIS — Z6836 Body mass index (BMI) 36.0-36.9, adult: Secondary | ICD-10-CM | POA: Diagnosis not present

## 2021-06-13 DIAGNOSIS — Z0389 Encounter for observation for other suspected diseases and conditions ruled out: Secondary | ICD-10-CM | POA: Diagnosis not present

## 2021-06-13 DIAGNOSIS — I517 Cardiomegaly: Secondary | ICD-10-CM | POA: Diagnosis not present

## 2021-06-13 DIAGNOSIS — Z9911 Dependence on respirator [ventilator] status: Secondary | ICD-10-CM | POA: Diagnosis not present

## 2021-06-13 DIAGNOSIS — R0689 Other abnormalities of breathing: Secondary | ICD-10-CM | POA: Diagnosis not present

## 2021-06-13 DIAGNOSIS — Z79891 Long term (current) use of opiate analgesic: Secondary | ICD-10-CM | POA: Diagnosis not present

## 2021-06-13 DIAGNOSIS — S31109A Unspecified open wound of abdominal wall, unspecified quadrant without penetration into peritoneal cavity, initial encounter: Secondary | ICD-10-CM | POA: Diagnosis not present

## 2021-06-13 DIAGNOSIS — E870 Hyperosmolality and hypernatremia: Secondary | ICD-10-CM | POA: Diagnosis not present

## 2021-06-13 DIAGNOSIS — J690 Pneumonitis due to inhalation of food and vomit: Secondary | ICD-10-CM | POA: Diagnosis not present

## 2021-06-13 DIAGNOSIS — E1165 Type 2 diabetes mellitus with hyperglycemia: Secondary | ICD-10-CM | POA: Diagnosis not present

## 2021-06-13 DIAGNOSIS — J9601 Acute respiratory failure with hypoxia: Secondary | ICD-10-CM | POA: Diagnosis not present

## 2021-06-13 DIAGNOSIS — I959 Hypotension, unspecified: Secondary | ICD-10-CM | POA: Diagnosis not present

## 2021-06-13 DIAGNOSIS — J90 Pleural effusion, not elsewhere classified: Secondary | ICD-10-CM | POA: Diagnosis not present

## 2021-06-13 DIAGNOSIS — E1140 Type 2 diabetes mellitus with diabetic neuropathy, unspecified: Secondary | ICD-10-CM | POA: Diagnosis not present

## 2021-06-13 DIAGNOSIS — K559 Vascular disorder of intestine, unspecified: Secondary | ICD-10-CM | POA: Diagnosis not present

## 2021-06-13 DIAGNOSIS — Z930 Tracheostomy status: Secondary | ICD-10-CM | POA: Diagnosis not present

## 2021-06-13 DIAGNOSIS — J9621 Acute and chronic respiratory failure with hypoxia: Secondary | ICD-10-CM | POA: Diagnosis not present

## 2021-06-13 DIAGNOSIS — R6521 Severe sepsis with septic shock: Secondary | ICD-10-CM | POA: Diagnosis not present

## 2021-06-13 DIAGNOSIS — R0902 Hypoxemia: Secondary | ICD-10-CM | POA: Diagnosis not present

## 2021-06-13 DIAGNOSIS — R0989 Other specified symptoms and signs involving the circulatory and respiratory systems: Secondary | ICD-10-CM | POA: Diagnosis not present

## 2021-06-13 DIAGNOSIS — E039 Hypothyroidism, unspecified: Secondary | ICD-10-CM | POA: Diagnosis not present

## 2021-06-13 DIAGNOSIS — J9600 Acute respiratory failure, unspecified whether with hypoxia or hypercapnia: Secondary | ICD-10-CM | POA: Diagnosis not present

## 2021-06-13 DIAGNOSIS — K565 Intestinal adhesions [bands], unspecified as to partial versus complete obstruction: Secondary | ICD-10-CM | POA: Diagnosis not present

## 2021-06-13 DIAGNOSIS — K219 Gastro-esophageal reflux disease without esophagitis: Secondary | ICD-10-CM | POA: Diagnosis not present

## 2021-06-13 DIAGNOSIS — Z9049 Acquired absence of other specified parts of digestive tract: Secondary | ICD-10-CM | POA: Diagnosis not present

## 2021-06-13 DIAGNOSIS — J189 Pneumonia, unspecified organism: Secondary | ICD-10-CM | POA: Diagnosis not present

## 2021-06-13 DIAGNOSIS — I340 Nonrheumatic mitral (valve) insufficiency: Secondary | ICD-10-CM | POA: Diagnosis not present

## 2021-06-13 DIAGNOSIS — E119 Type 2 diabetes mellitus without complications: Secondary | ICD-10-CM | POA: Diagnosis not present

## 2021-06-13 DIAGNOSIS — M7989 Other specified soft tissue disorders: Secondary | ICD-10-CM | POA: Diagnosis not present

## 2021-06-13 DIAGNOSIS — R601 Generalized edema: Secondary | ICD-10-CM | POA: Diagnosis not present

## 2021-06-13 DIAGNOSIS — R579 Shock, unspecified: Secondary | ICD-10-CM | POA: Diagnosis not present

## 2021-06-14 DIAGNOSIS — R6521 Severe sepsis with septic shock: Secondary | ICD-10-CM | POA: Diagnosis not present

## 2021-06-14 DIAGNOSIS — J9600 Acute respiratory failure, unspecified whether with hypoxia or hypercapnia: Secondary | ICD-10-CM | POA: Diagnosis not present

## 2021-06-14 DIAGNOSIS — A419 Sepsis, unspecified organism: Secondary | ICD-10-CM | POA: Diagnosis not present

## 2021-06-14 DIAGNOSIS — E119 Type 2 diabetes mellitus without complications: Secondary | ICD-10-CM | POA: Diagnosis not present

## 2021-06-14 DIAGNOSIS — J9601 Acute respiratory failure with hypoxia: Secondary | ICD-10-CM | POA: Diagnosis not present

## 2021-06-14 DIAGNOSIS — N179 Acute kidney failure, unspecified: Secondary | ICD-10-CM | POA: Diagnosis not present

## 2021-06-15 DIAGNOSIS — Z9911 Dependence on respirator [ventilator] status: Secondary | ICD-10-CM | POA: Diagnosis not present

## 2021-06-15 DIAGNOSIS — A419 Sepsis, unspecified organism: Secondary | ICD-10-CM | POA: Diagnosis not present

## 2021-06-15 DIAGNOSIS — K559 Vascular disorder of intestine, unspecified: Secondary | ICD-10-CM | POA: Diagnosis not present

## 2021-06-15 DIAGNOSIS — R6521 Severe sepsis with septic shock: Secondary | ICD-10-CM | POA: Diagnosis not present

## 2021-06-15 DIAGNOSIS — J9600 Acute respiratory failure, unspecified whether with hypoxia or hypercapnia: Secondary | ICD-10-CM | POA: Diagnosis not present

## 2021-06-15 DIAGNOSIS — J9601 Acute respiratory failure with hypoxia: Secondary | ICD-10-CM | POA: Diagnosis not present

## 2021-06-15 DIAGNOSIS — E119 Type 2 diabetes mellitus without complications: Secondary | ICD-10-CM | POA: Diagnosis not present

## 2021-06-15 DIAGNOSIS — K6389 Other specified diseases of intestine: Secondary | ICD-10-CM | POA: Diagnosis not present

## 2021-06-15 DIAGNOSIS — N179 Acute kidney failure, unspecified: Secondary | ICD-10-CM | POA: Diagnosis not present

## 2021-06-16 DIAGNOSIS — J9600 Acute respiratory failure, unspecified whether with hypoxia or hypercapnia: Secondary | ICD-10-CM | POA: Diagnosis not present

## 2021-06-16 DIAGNOSIS — N179 Acute kidney failure, unspecified: Secondary | ICD-10-CM | POA: Diagnosis not present

## 2021-06-16 DIAGNOSIS — E119 Type 2 diabetes mellitus without complications: Secondary | ICD-10-CM | POA: Diagnosis not present

## 2021-06-16 DIAGNOSIS — J9601 Acute respiratory failure with hypoxia: Secondary | ICD-10-CM | POA: Diagnosis not present

## 2021-06-16 DIAGNOSIS — R6521 Severe sepsis with septic shock: Secondary | ICD-10-CM | POA: Diagnosis not present

## 2021-06-16 DIAGNOSIS — A419 Sepsis, unspecified organism: Secondary | ICD-10-CM | POA: Diagnosis not present

## 2021-06-17 DIAGNOSIS — K559 Vascular disorder of intestine, unspecified: Secondary | ICD-10-CM | POA: Diagnosis not present

## 2021-06-17 DIAGNOSIS — K55029 Acute infarction of small intestine, extent unspecified: Secondary | ICD-10-CM | POA: Diagnosis not present

## 2021-06-17 DIAGNOSIS — N179 Acute kidney failure, unspecified: Secondary | ICD-10-CM | POA: Diagnosis not present

## 2021-06-17 DIAGNOSIS — R6521 Severe sepsis with septic shock: Secondary | ICD-10-CM | POA: Diagnosis not present

## 2021-06-17 DIAGNOSIS — K565 Intestinal adhesions [bands], unspecified as to partial versus complete obstruction: Secondary | ICD-10-CM | POA: Diagnosis not present

## 2021-06-17 DIAGNOSIS — Z0389 Encounter for observation for other suspected diseases and conditions ruled out: Secondary | ICD-10-CM | POA: Diagnosis not present

## 2021-06-17 DIAGNOSIS — J9600 Acute respiratory failure, unspecified whether with hypoxia or hypercapnia: Secondary | ICD-10-CM | POA: Diagnosis not present

## 2021-06-17 DIAGNOSIS — A419 Sepsis, unspecified organism: Secondary | ICD-10-CM | POA: Diagnosis not present

## 2021-06-17 DIAGNOSIS — S31109A Unspecified open wound of abdominal wall, unspecified quadrant without penetration into peritoneal cavity, initial encounter: Secondary | ICD-10-CM | POA: Diagnosis not present

## 2021-06-18 DIAGNOSIS — K559 Vascular disorder of intestine, unspecified: Secondary | ICD-10-CM | POA: Diagnosis not present

## 2021-06-18 DIAGNOSIS — N179 Acute kidney failure, unspecified: Secondary | ICD-10-CM | POA: Diagnosis not present

## 2021-06-18 DIAGNOSIS — Z4682 Encounter for fitting and adjustment of non-vascular catheter: Secondary | ICD-10-CM | POA: Diagnosis not present

## 2021-06-18 DIAGNOSIS — R6521 Severe sepsis with septic shock: Secondary | ICD-10-CM | POA: Diagnosis not present

## 2021-06-18 DIAGNOSIS — J9600 Acute respiratory failure, unspecified whether with hypoxia or hypercapnia: Secondary | ICD-10-CM | POA: Diagnosis not present

## 2021-06-19 DIAGNOSIS — A419 Sepsis, unspecified organism: Secondary | ICD-10-CM | POA: Diagnosis not present

## 2021-06-19 DIAGNOSIS — J9600 Acute respiratory failure, unspecified whether with hypoxia or hypercapnia: Secondary | ICD-10-CM | POA: Diagnosis not present

## 2021-06-19 DIAGNOSIS — T17398A Other foreign object in larynx causing other injury, initial encounter: Secondary | ICD-10-CM | POA: Diagnosis not present

## 2021-06-19 DIAGNOSIS — Z452 Encounter for adjustment and management of vascular access device: Secondary | ICD-10-CM | POA: Diagnosis not present

## 2021-06-19 DIAGNOSIS — K559 Vascular disorder of intestine, unspecified: Secondary | ICD-10-CM | POA: Diagnosis not present

## 2021-06-19 DIAGNOSIS — N179 Acute kidney failure, unspecified: Secondary | ICD-10-CM | POA: Diagnosis not present

## 2021-06-19 DIAGNOSIS — R1310 Dysphagia, unspecified: Secondary | ICD-10-CM | POA: Diagnosis not present

## 2021-06-19 DIAGNOSIS — R6521 Severe sepsis with septic shock: Secondary | ICD-10-CM | POA: Diagnosis not present

## 2021-06-20 DIAGNOSIS — N179 Acute kidney failure, unspecified: Secondary | ICD-10-CM | POA: Diagnosis not present

## 2021-06-20 DIAGNOSIS — A419 Sepsis, unspecified organism: Secondary | ICD-10-CM | POA: Diagnosis not present

## 2021-06-20 DIAGNOSIS — E870 Hyperosmolality and hypernatremia: Secondary | ICD-10-CM | POA: Diagnosis not present

## 2021-06-20 DIAGNOSIS — K559 Vascular disorder of intestine, unspecified: Secondary | ICD-10-CM | POA: Diagnosis not present

## 2021-06-21 DIAGNOSIS — Z4682 Encounter for fitting and adjustment of non-vascular catheter: Secondary | ICD-10-CM | POA: Diagnosis not present

## 2021-06-21 DIAGNOSIS — N179 Acute kidney failure, unspecified: Secondary | ICD-10-CM | POA: Diagnosis not present

## 2021-06-21 DIAGNOSIS — K559 Vascular disorder of intestine, unspecified: Secondary | ICD-10-CM | POA: Diagnosis not present

## 2021-06-21 DIAGNOSIS — A419 Sepsis, unspecified organism: Secondary | ICD-10-CM | POA: Diagnosis not present

## 2021-06-21 DIAGNOSIS — E870 Hyperosmolality and hypernatremia: Secondary | ICD-10-CM | POA: Diagnosis not present

## 2021-06-22 DIAGNOSIS — N179 Acute kidney failure, unspecified: Secondary | ICD-10-CM | POA: Diagnosis not present

## 2021-06-22 DIAGNOSIS — E870 Hyperosmolality and hypernatremia: Secondary | ICD-10-CM | POA: Diagnosis not present

## 2021-06-22 DIAGNOSIS — E119 Type 2 diabetes mellitus without complications: Secondary | ICD-10-CM | POA: Diagnosis not present

## 2021-06-22 DIAGNOSIS — A419 Sepsis, unspecified organism: Secondary | ICD-10-CM | POA: Diagnosis not present

## 2021-06-22 DIAGNOSIS — J9600 Acute respiratory failure, unspecified whether with hypoxia or hypercapnia: Secondary | ICD-10-CM | POA: Diagnosis not present

## 2021-06-22 DIAGNOSIS — K559 Vascular disorder of intestine, unspecified: Secondary | ICD-10-CM | POA: Diagnosis not present

## 2021-06-22 DIAGNOSIS — R6521 Severe sepsis with septic shock: Secondary | ICD-10-CM | POA: Diagnosis not present

## 2021-06-23 DIAGNOSIS — J9600 Acute respiratory failure, unspecified whether with hypoxia or hypercapnia: Secondary | ICD-10-CM | POA: Diagnosis not present

## 2021-06-23 DIAGNOSIS — A419 Sepsis, unspecified organism: Secondary | ICD-10-CM | POA: Diagnosis not present

## 2021-06-23 DIAGNOSIS — E870 Hyperosmolality and hypernatremia: Secondary | ICD-10-CM | POA: Diagnosis not present

## 2021-06-23 DIAGNOSIS — N179 Acute kidney failure, unspecified: Secondary | ICD-10-CM | POA: Diagnosis not present

## 2021-06-23 DIAGNOSIS — R188 Other ascites: Secondary | ICD-10-CM | POA: Diagnosis not present

## 2021-06-23 DIAGNOSIS — R6521 Severe sepsis with septic shock: Secondary | ICD-10-CM | POA: Diagnosis not present

## 2021-06-23 DIAGNOSIS — K559 Vascular disorder of intestine, unspecified: Secondary | ICD-10-CM | POA: Diagnosis not present

## 2021-06-23 DIAGNOSIS — E119 Type 2 diabetes mellitus without complications: Secondary | ICD-10-CM | POA: Diagnosis not present

## 2021-06-24 DIAGNOSIS — J9601 Acute respiratory failure with hypoxia: Secondary | ICD-10-CM | POA: Diagnosis not present

## 2021-06-24 DIAGNOSIS — A419 Sepsis, unspecified organism: Secondary | ICD-10-CM | POA: Diagnosis not present

## 2021-06-24 DIAGNOSIS — I10 Essential (primary) hypertension: Secondary | ICD-10-CM | POA: Diagnosis not present

## 2021-06-24 DIAGNOSIS — I469 Cardiac arrest, cause unspecified: Secondary | ICD-10-CM | POA: Diagnosis not present

## 2021-06-24 DIAGNOSIS — Z4682 Encounter for fitting and adjustment of non-vascular catheter: Secondary | ICD-10-CM | POA: Diagnosis not present

## 2021-06-24 DIAGNOSIS — E870 Hyperosmolality and hypernatremia: Secondary | ICD-10-CM | POA: Diagnosis not present

## 2021-06-24 DIAGNOSIS — R6521 Severe sepsis with septic shock: Secondary | ICD-10-CM | POA: Diagnosis not present

## 2021-06-24 DIAGNOSIS — E1165 Type 2 diabetes mellitus with hyperglycemia: Secondary | ICD-10-CM | POA: Diagnosis not present

## 2021-06-25 DIAGNOSIS — R6521 Severe sepsis with septic shock: Secondary | ICD-10-CM | POA: Diagnosis not present

## 2021-06-25 DIAGNOSIS — J9600 Acute respiratory failure, unspecified whether with hypoxia or hypercapnia: Secondary | ICD-10-CM | POA: Diagnosis not present

## 2021-06-25 DIAGNOSIS — J9601 Acute respiratory failure with hypoxia: Secondary | ICD-10-CM | POA: Diagnosis not present

## 2021-06-25 DIAGNOSIS — I469 Cardiac arrest, cause unspecified: Secondary | ICD-10-CM | POA: Diagnosis not present

## 2021-06-25 DIAGNOSIS — A419 Sepsis, unspecified organism: Secondary | ICD-10-CM | POA: Diagnosis not present

## 2021-06-25 DIAGNOSIS — J690 Pneumonitis due to inhalation of food and vomit: Secondary | ICD-10-CM | POA: Diagnosis not present

## 2021-06-26 DIAGNOSIS — R6521 Severe sepsis with septic shock: Secondary | ICD-10-CM | POA: Diagnosis not present

## 2021-06-26 DIAGNOSIS — J690 Pneumonitis due to inhalation of food and vomit: Secondary | ICD-10-CM | POA: Diagnosis not present

## 2021-06-26 DIAGNOSIS — I469 Cardiac arrest, cause unspecified: Secondary | ICD-10-CM | POA: Diagnosis not present

## 2021-06-26 DIAGNOSIS — A419 Sepsis, unspecified organism: Secondary | ICD-10-CM | POA: Diagnosis not present

## 2021-06-26 DIAGNOSIS — J9601 Acute respiratory failure with hypoxia: Secondary | ICD-10-CM | POA: Diagnosis not present

## 2021-06-26 DIAGNOSIS — J9600 Acute respiratory failure, unspecified whether with hypoxia or hypercapnia: Secondary | ICD-10-CM | POA: Diagnosis not present

## 2021-06-27 DIAGNOSIS — J9601 Acute respiratory failure with hypoxia: Secondary | ICD-10-CM | POA: Diagnosis not present

## 2021-06-27 DIAGNOSIS — R6521 Severe sepsis with septic shock: Secondary | ICD-10-CM | POA: Diagnosis not present

## 2021-06-27 DIAGNOSIS — I469 Cardiac arrest, cause unspecified: Secondary | ICD-10-CM | POA: Diagnosis not present

## 2021-06-27 DIAGNOSIS — J9600 Acute respiratory failure, unspecified whether with hypoxia or hypercapnia: Secondary | ICD-10-CM | POA: Diagnosis not present

## 2021-06-27 DIAGNOSIS — A419 Sepsis, unspecified organism: Secondary | ICD-10-CM | POA: Diagnosis not present

## 2021-06-27 DIAGNOSIS — J690 Pneumonitis due to inhalation of food and vomit: Secondary | ICD-10-CM | POA: Diagnosis not present

## 2021-06-28 DIAGNOSIS — J9601 Acute respiratory failure with hypoxia: Secondary | ICD-10-CM | POA: Diagnosis not present

## 2021-06-28 DIAGNOSIS — R6521 Severe sepsis with septic shock: Secondary | ICD-10-CM | POA: Diagnosis not present

## 2021-06-28 DIAGNOSIS — A419 Sepsis, unspecified organism: Secondary | ICD-10-CM | POA: Diagnosis not present

## 2021-06-28 DIAGNOSIS — J690 Pneumonitis due to inhalation of food and vomit: Secondary | ICD-10-CM | POA: Diagnosis not present

## 2021-06-28 DIAGNOSIS — I469 Cardiac arrest, cause unspecified: Secondary | ICD-10-CM | POA: Diagnosis not present

## 2021-06-28 DIAGNOSIS — J9600 Acute respiratory failure, unspecified whether with hypoxia or hypercapnia: Secondary | ICD-10-CM | POA: Diagnosis not present

## 2021-06-29 DIAGNOSIS — J690 Pneumonitis due to inhalation of food and vomit: Secondary | ICD-10-CM | POA: Diagnosis not present

## 2021-06-29 DIAGNOSIS — J9600 Acute respiratory failure, unspecified whether with hypoxia or hypercapnia: Secondary | ICD-10-CM | POA: Diagnosis not present

## 2021-06-29 DIAGNOSIS — J9601 Acute respiratory failure with hypoxia: Secondary | ICD-10-CM | POA: Diagnosis not present

## 2021-06-29 DIAGNOSIS — I469 Cardiac arrest, cause unspecified: Secondary | ICD-10-CM | POA: Diagnosis not present

## 2021-06-29 DIAGNOSIS — R6521 Severe sepsis with septic shock: Secondary | ICD-10-CM | POA: Diagnosis not present

## 2021-06-29 DIAGNOSIS — A419 Sepsis, unspecified organism: Secondary | ICD-10-CM | POA: Diagnosis not present

## 2021-07-03 DIAGNOSIS — R188 Other ascites: Secondary | ICD-10-CM | POA: Diagnosis not present

## 2021-07-03 DIAGNOSIS — J189 Pneumonia, unspecified organism: Secondary | ICD-10-CM | POA: Diagnosis not present

## 2021-07-03 DIAGNOSIS — J90 Pleural effusion, not elsewhere classified: Secondary | ICD-10-CM | POA: Diagnosis not present

## 2021-07-03 DIAGNOSIS — K6389 Other specified diseases of intestine: Secondary | ICD-10-CM | POA: Diagnosis not present

## 2021-07-03 DIAGNOSIS — J690 Pneumonitis due to inhalation of food and vomit: Secondary | ICD-10-CM | POA: Diagnosis not present

## 2021-07-03 DIAGNOSIS — J9601 Acute respiratory failure with hypoxia: Secondary | ICD-10-CM | POA: Diagnosis not present

## 2021-07-04 DIAGNOSIS — J9600 Acute respiratory failure, unspecified whether with hypoxia or hypercapnia: Secondary | ICD-10-CM | POA: Diagnosis not present

## 2021-07-04 DIAGNOSIS — Z9049 Acquired absence of other specified parts of digestive tract: Secondary | ICD-10-CM | POA: Diagnosis not present

## 2021-07-04 DIAGNOSIS — R601 Generalized edema: Secondary | ICD-10-CM | POA: Diagnosis not present

## 2021-07-05 DIAGNOSIS — J9690 Respiratory failure, unspecified, unspecified whether with hypoxia or hypercapnia: Secondary | ICD-10-CM | POA: Diagnosis not present

## 2021-07-05 DIAGNOSIS — R601 Generalized edema: Secondary | ICD-10-CM | POA: Diagnosis not present

## 2021-07-05 DIAGNOSIS — E639 Nutritional deficiency, unspecified: Secondary | ICD-10-CM | POA: Diagnosis not present

## 2021-07-05 DIAGNOSIS — Z9049 Acquired absence of other specified parts of digestive tract: Secondary | ICD-10-CM | POA: Diagnosis not present

## 2021-07-06 DIAGNOSIS — R601 Generalized edema: Secondary | ICD-10-CM | POA: Diagnosis not present

## 2021-07-06 DIAGNOSIS — Z9049 Acquired absence of other specified parts of digestive tract: Secondary | ICD-10-CM | POA: Diagnosis not present

## 2021-07-07 DIAGNOSIS — E785 Hyperlipidemia, unspecified: Secondary | ICD-10-CM | POA: Diagnosis not present

## 2021-07-07 DIAGNOSIS — R601 Generalized edema: Secondary | ICD-10-CM | POA: Diagnosis not present

## 2021-07-07 DIAGNOSIS — E039 Hypothyroidism, unspecified: Secondary | ICD-10-CM | POA: Diagnosis not present

## 2021-07-07 DIAGNOSIS — Z9049 Acquired absence of other specified parts of digestive tract: Secondary | ICD-10-CM | POA: Diagnosis not present

## 2021-07-07 DIAGNOSIS — E1140 Type 2 diabetes mellitus with diabetic neuropathy, unspecified: Secondary | ICD-10-CM | POA: Diagnosis not present

## 2021-07-07 DIAGNOSIS — K219 Gastro-esophageal reflux disease without esophagitis: Secondary | ICD-10-CM | POA: Diagnosis not present

## 2021-07-08 DIAGNOSIS — J9600 Acute respiratory failure, unspecified whether with hypoxia or hypercapnia: Secondary | ICD-10-CM | POA: Diagnosis not present

## 2021-07-08 DIAGNOSIS — R601 Generalized edema: Secondary | ICD-10-CM | POA: Diagnosis not present

## 2021-07-09 DIAGNOSIS — N179 Acute kidney failure, unspecified: Secondary | ICD-10-CM | POA: Diagnosis not present

## 2021-07-09 DIAGNOSIS — J9600 Acute respiratory failure, unspecified whether with hypoxia or hypercapnia: Secondary | ICD-10-CM | POA: Diagnosis not present

## 2021-07-09 DIAGNOSIS — R601 Generalized edema: Secondary | ICD-10-CM | POA: Diagnosis not present

## 2021-07-09 DIAGNOSIS — M7989 Other specified soft tissue disorders: Secondary | ICD-10-CM | POA: Diagnosis not present

## 2021-07-09 DIAGNOSIS — M6281 Muscle weakness (generalized): Secondary | ICD-10-CM | POA: Diagnosis not present

## 2021-07-09 DIAGNOSIS — Z9049 Acquired absence of other specified parts of digestive tract: Secondary | ICD-10-CM | POA: Diagnosis not present

## 2021-07-10 DIAGNOSIS — J9600 Acute respiratory failure, unspecified whether with hypoxia or hypercapnia: Secondary | ICD-10-CM | POA: Diagnosis not present

## 2021-07-10 DIAGNOSIS — J810 Acute pulmonary edema: Secondary | ICD-10-CM | POA: Diagnosis not present

## 2021-07-10 DIAGNOSIS — Z9049 Acquired absence of other specified parts of digestive tract: Secondary | ICD-10-CM | POA: Diagnosis not present

## 2021-07-10 DIAGNOSIS — J90 Pleural effusion, not elsewhere classified: Secondary | ICD-10-CM | POA: Diagnosis not present

## 2021-07-10 DIAGNOSIS — N179 Acute kidney failure, unspecified: Secondary | ICD-10-CM | POA: Diagnosis not present

## 2021-07-10 DIAGNOSIS — R601 Generalized edema: Secondary | ICD-10-CM | POA: Diagnosis not present

## 2021-07-12 DIAGNOSIS — R059 Cough, unspecified: Secondary | ICD-10-CM | POA: Diagnosis not present

## 2021-07-13 ENCOUNTER — Inpatient Hospital Stay
Admission: RE | Admit: 2021-07-13 | Discharge: 2021-08-30 | Disposition: A | Discharge status: Skilled Nursing Facility | Payer: PPO | Source: Other Acute Inpatient Hospital | Attending: Internal Medicine | Admitting: Internal Medicine

## 2021-07-13 ENCOUNTER — Other Ambulatory Visit (HOSPITAL_COMMUNITY): Payer: PPO

## 2021-07-13 DIAGNOSIS — I517 Cardiomegaly: Secondary | ICD-10-CM | POA: Diagnosis not present

## 2021-07-13 DIAGNOSIS — R278 Other lack of coordination: Secondary | ICD-10-CM | POA: Diagnosis not present

## 2021-07-13 DIAGNOSIS — A419 Sepsis, unspecified organism: Secondary | ICD-10-CM

## 2021-07-13 DIAGNOSIS — N179 Acute kidney failure, unspecified: Secondary | ICD-10-CM | POA: Diagnosis not present

## 2021-07-13 DIAGNOSIS — Z930 Tracheostomy status: Secondary | ICD-10-CM

## 2021-07-13 DIAGNOSIS — R0602 Shortness of breath: Secondary | ICD-10-CM | POA: Diagnosis not present

## 2021-07-13 DIAGNOSIS — Z430 Encounter for attention to tracheostomy: Secondary | ICD-10-CM | POA: Diagnosis not present

## 2021-07-13 DIAGNOSIS — G928 Other toxic encephalopathy: Secondary | ICD-10-CM | POA: Diagnosis not present

## 2021-07-13 DIAGNOSIS — K559 Vascular disorder of intestine, unspecified: Secondary | ICD-10-CM

## 2021-07-13 DIAGNOSIS — J019 Acute sinusitis, unspecified: Secondary | ICD-10-CM | POA: Diagnosis not present

## 2021-07-13 DIAGNOSIS — M6259 Muscle wasting and atrophy, not elsewhere classified, multiple sites: Secondary | ICD-10-CM | POA: Diagnosis not present

## 2021-07-13 DIAGNOSIS — J9601 Acute respiratory failure with hypoxia: Secondary | ICD-10-CM | POA: Diagnosis not present

## 2021-07-13 DIAGNOSIS — J9600 Acute respiratory failure, unspecified whether with hypoxia or hypercapnia: Secondary | ICD-10-CM | POA: Diagnosis not present

## 2021-07-13 DIAGNOSIS — J155 Pneumonia due to Escherichia coli: Secondary | ICD-10-CM | POA: Diagnosis not present

## 2021-07-13 DIAGNOSIS — J189 Pneumonia, unspecified organism: Secondary | ICD-10-CM | POA: Diagnosis not present

## 2021-07-13 DIAGNOSIS — G9340 Encephalopathy, unspecified: Secondary | ICD-10-CM | POA: Diagnosis not present

## 2021-07-13 DIAGNOSIS — K820 Obstruction of gallbladder: Secondary | ICD-10-CM | POA: Diagnosis not present

## 2021-07-13 DIAGNOSIS — L89610 Pressure ulcer of right heel, unstageable: Secondary | ICD-10-CM | POA: Diagnosis not present

## 2021-07-13 DIAGNOSIS — J15212 Pneumonia due to Methicillin resistant Staphylococcus aureus: Secondary | ICD-10-CM | POA: Diagnosis not present

## 2021-07-13 DIAGNOSIS — G9341 Metabolic encephalopathy: Secondary | ICD-10-CM | POA: Diagnosis not present

## 2021-07-13 DIAGNOSIS — E119 Type 2 diabetes mellitus without complications: Secondary | ICD-10-CM | POA: Diagnosis not present

## 2021-07-13 DIAGNOSIS — L89626 Pressure-induced deep tissue damage of left heel: Secondary | ICD-10-CM | POA: Diagnosis not present

## 2021-07-13 DIAGNOSIS — K219 Gastro-esophageal reflux disease without esophagitis: Secondary | ICD-10-CM | POA: Diagnosis not present

## 2021-07-13 DIAGNOSIS — I2510 Atherosclerotic heart disease of native coronary artery without angina pectoris: Secondary | ICD-10-CM | POA: Diagnosis not present

## 2021-07-13 DIAGNOSIS — Z7401 Bed confinement status: Secondary | ICD-10-CM | POA: Diagnosis not present

## 2021-07-13 DIAGNOSIS — R1312 Dysphagia, oropharyngeal phase: Secondary | ICD-10-CM | POA: Diagnosis not present

## 2021-07-13 DIAGNOSIS — E039 Hypothyroidism, unspecified: Secondary | ICD-10-CM | POA: Diagnosis not present

## 2021-07-13 DIAGNOSIS — I10 Essential (primary) hypertension: Secondary | ICD-10-CM | POA: Diagnosis not present

## 2021-07-13 DIAGNOSIS — J984 Other disorders of lung: Secondary | ICD-10-CM | POA: Diagnosis not present

## 2021-07-13 DIAGNOSIS — Z8701 Personal history of pneumonia (recurrent): Secondary | ICD-10-CM | POA: Diagnosis not present

## 2021-07-13 DIAGNOSIS — I639 Cerebral infarction, unspecified: Secondary | ICD-10-CM | POA: Diagnosis not present

## 2021-07-13 DIAGNOSIS — Z8674 Personal history of sudden cardiac arrest: Secondary | ICD-10-CM | POA: Diagnosis not present

## 2021-07-13 DIAGNOSIS — M47816 Spondylosis without myelopathy or radiculopathy, lumbar region: Secondary | ICD-10-CM | POA: Diagnosis not present

## 2021-07-13 DIAGNOSIS — E46 Unspecified protein-calorie malnutrition: Secondary | ICD-10-CM | POA: Diagnosis not present

## 2021-07-13 DIAGNOSIS — J9621 Acute and chronic respiratory failure with hypoxia: Secondary | ICD-10-CM | POA: Diagnosis not present

## 2021-07-13 DIAGNOSIS — R6521 Severe sepsis with septic shock: Secondary | ICD-10-CM

## 2021-07-13 DIAGNOSIS — Z931 Gastrostomy status: Secondary | ICD-10-CM | POA: Diagnosis not present

## 2021-07-13 DIAGNOSIS — N390 Urinary tract infection, site not specified: Secondary | ICD-10-CM | POA: Diagnosis not present

## 2021-07-13 DIAGNOSIS — Z87891 Personal history of nicotine dependence: Secondary | ICD-10-CM | POA: Diagnosis not present

## 2021-07-13 DIAGNOSIS — J9811 Atelectasis: Secondary | ICD-10-CM | POA: Diagnosis not present

## 2021-07-13 DIAGNOSIS — J449 Chronic obstructive pulmonary disease, unspecified: Secondary | ICD-10-CM | POA: Diagnosis not present

## 2021-07-13 DIAGNOSIS — J90 Pleural effusion, not elsewhere classified: Secondary | ICD-10-CM | POA: Diagnosis not present

## 2021-07-13 DIAGNOSIS — Z86718 Personal history of other venous thrombosis and embolism: Secondary | ICD-10-CM | POA: Diagnosis not present

## 2021-07-13 DIAGNOSIS — Z432 Encounter for attention to ileostomy: Secondary | ICD-10-CM | POA: Diagnosis not present

## 2021-07-13 DIAGNOSIS — Z743 Need for continuous supervision: Secondary | ICD-10-CM | POA: Diagnosis not present

## 2021-07-13 DIAGNOSIS — R0689 Other abnormalities of breathing: Secondary | ICD-10-CM | POA: Diagnosis not present

## 2021-07-13 DIAGNOSIS — J690 Pneumonitis due to inhalation of food and vomit: Secondary | ICD-10-CM

## 2021-07-13 DIAGNOSIS — Z95828 Presence of other vascular implants and grafts: Secondary | ICD-10-CM

## 2021-07-13 DIAGNOSIS — D72829 Elevated white blood cell count, unspecified: Secondary | ICD-10-CM

## 2021-07-13 DIAGNOSIS — J989 Respiratory disorder, unspecified: Secondary | ICD-10-CM | POA: Diagnosis not present

## 2021-07-13 DIAGNOSIS — J439 Emphysema, unspecified: Secondary | ICD-10-CM | POA: Diagnosis not present

## 2021-07-13 DIAGNOSIS — J9690 Respiratory failure, unspecified, unspecified whether with hypoxia or hypercapnia: Secondary | ICD-10-CM | POA: Diagnosis not present

## 2021-07-13 DIAGNOSIS — B370 Candidal stomatitis: Secondary | ICD-10-CM | POA: Diagnosis not present

## 2021-07-13 DIAGNOSIS — N281 Cyst of kidney, acquired: Secondary | ICD-10-CM | POA: Diagnosis not present

## 2021-07-13 DIAGNOSIS — Z4682 Encounter for fitting and adjustment of non-vascular catheter: Secondary | ICD-10-CM | POA: Diagnosis not present

## 2021-07-13 DIAGNOSIS — Z48815 Encounter for surgical aftercare following surgery on the digestive system: Secondary | ICD-10-CM | POA: Diagnosis not present

## 2021-07-13 DIAGNOSIS — D729 Disorder of white blood cells, unspecified: Secondary | ICD-10-CM

## 2021-07-13 DIAGNOSIS — L89156 Pressure-induced deep tissue damage of sacral region: Secondary | ICD-10-CM | POA: Diagnosis not present

## 2021-07-13 DIAGNOSIS — R531 Weakness: Secondary | ICD-10-CM | POA: Diagnosis not present

## 2021-07-13 DIAGNOSIS — E785 Hyperlipidemia, unspecified: Secondary | ICD-10-CM | POA: Diagnosis not present

## 2021-07-13 DIAGNOSIS — R1310 Dysphagia, unspecified: Secondary | ICD-10-CM | POA: Diagnosis not present

## 2021-07-13 DIAGNOSIS — Z431 Encounter for attention to gastrostomy: Secondary | ICD-10-CM | POA: Diagnosis not present

## 2021-07-13 LAB — BLOOD GAS, ARTERIAL
Acid-Base Excess: 6 mmol/L — ABNORMAL HIGH (ref 0.0–2.0)
Bicarbonate: 30.6 mmol/L — ABNORMAL HIGH (ref 20.0–28.0)
FIO2: 35
O2 Saturation: 95 %
Patient temperature: 37
pCO2 arterial: 49.2 mmHg — ABNORMAL HIGH (ref 32.0–48.0)
pH, Arterial: 7.41 (ref 7.350–7.450)
pO2, Arterial: 74.8 mmHg — ABNORMAL LOW (ref 83.0–108.0)

## 2021-07-14 DIAGNOSIS — N179 Acute kidney failure, unspecified: Secondary | ICD-10-CM | POA: Diagnosis not present

## 2021-07-14 DIAGNOSIS — Z930 Tracheostomy status: Secondary | ICD-10-CM

## 2021-07-14 DIAGNOSIS — J9621 Acute and chronic respiratory failure with hypoxia: Secondary | ICD-10-CM | POA: Diagnosis not present

## 2021-07-14 DIAGNOSIS — K559 Vascular disorder of intestine, unspecified: Secondary | ICD-10-CM

## 2021-07-14 DIAGNOSIS — G9340 Encephalopathy, unspecified: Secondary | ICD-10-CM | POA: Diagnosis not present

## 2021-07-14 DIAGNOSIS — R6521 Severe sepsis with septic shock: Secondary | ICD-10-CM | POA: Diagnosis not present

## 2021-07-14 DIAGNOSIS — E119 Type 2 diabetes mellitus without complications: Secondary | ICD-10-CM | POA: Diagnosis not present

## 2021-07-14 DIAGNOSIS — J9600 Acute respiratory failure, unspecified whether with hypoxia or hypercapnia: Secondary | ICD-10-CM | POA: Diagnosis not present

## 2021-07-14 DIAGNOSIS — J690 Pneumonitis due to inhalation of food and vomit: Secondary | ICD-10-CM

## 2021-07-14 DIAGNOSIS — A419 Sepsis, unspecified organism: Secondary | ICD-10-CM | POA: Diagnosis not present

## 2021-07-14 LAB — URINALYSIS, ROUTINE W REFLEX MICROSCOPIC
Bilirubin Urine: NEGATIVE
Glucose, UA: NEGATIVE mg/dL
Ketones, ur: NEGATIVE mg/dL
Nitrite: NEGATIVE
Protein, ur: NEGATIVE mg/dL
Specific Gravity, Urine: 1.015 (ref 1.005–1.030)
pH: 6 (ref 5.0–8.0)

## 2021-07-14 LAB — PHOSPHORUS: Phosphorus: 3.6 mg/dL (ref 2.5–4.6)

## 2021-07-14 LAB — CBC WITH DIFFERENTIAL/PLATELET
Abs Immature Granulocytes: 0.2 10*3/uL — ABNORMAL HIGH (ref 0.00–0.07)
Band Neutrophils: 1 %
Basophils Absolute: 0 10*3/uL (ref 0.0–0.1)
Basophils Relative: 0 %
Eosinophils Absolute: 0 10*3/uL (ref 0.0–0.5)
Eosinophils Relative: 0 %
HCT: 31.2 % — ABNORMAL LOW (ref 36.0–46.0)
Hemoglobin: 9.5 g/dL — ABNORMAL LOW (ref 12.0–15.0)
Lymphocytes Relative: 6 %
Lymphs Abs: 1.1 10*3/uL (ref 0.7–4.0)
MCH: 32.8 pg (ref 26.0–34.0)
MCHC: 30.4 g/dL (ref 30.0–36.0)
MCV: 107.6 fL — ABNORMAL HIGH (ref 80.0–100.0)
Metamyelocytes Relative: 1 %
Monocytes Absolute: 0.2 10*3/uL (ref 0.1–1.0)
Monocytes Relative: 1 %
Neutro Abs: 16.1 10*3/uL — ABNORMAL HIGH (ref 1.7–7.7)
Neutrophils Relative %: 91 %
Platelets: 472 10*3/uL — ABNORMAL HIGH (ref 150–400)
RBC: 2.9 MIL/uL — ABNORMAL LOW (ref 3.87–5.11)
RDW: 23.6 % — ABNORMAL HIGH (ref 11.5–15.5)
WBC: 17.5 10*3/uL — ABNORMAL HIGH (ref 4.0–10.5)
nRBC: 0 /100 WBC
nRBC: 0.9 % — ABNORMAL HIGH (ref 0.0–0.2)

## 2021-07-14 LAB — PROTIME-INR
INR: 1.1 (ref 0.8–1.2)
Prothrombin Time: 14.1 seconds (ref 11.4–15.2)

## 2021-07-14 LAB — COMPREHENSIVE METABOLIC PANEL
ALT: 53 U/L — ABNORMAL HIGH (ref 0–44)
AST: 38 U/L (ref 15–41)
Albumin: 1.6 g/dL — ABNORMAL LOW (ref 3.5–5.0)
Alkaline Phosphatase: 164 U/L — ABNORMAL HIGH (ref 38–126)
Anion gap: 7 (ref 5–15)
BUN: 12 mg/dL (ref 8–23)
CO2: 33 mmol/L — ABNORMAL HIGH (ref 22–32)
Calcium: 8.8 mg/dL — ABNORMAL LOW (ref 8.9–10.3)
Chloride: 104 mmol/L (ref 98–111)
Creatinine, Ser: 0.34 mg/dL — ABNORMAL LOW (ref 0.44–1.00)
GFR, Estimated: >60 mL/min (ref >60)
Glucose, Bld: 151 mg/dL — ABNORMAL HIGH (ref 70–99)
Potassium: 3.7 mmol/L (ref 3.5–5.1)
Sodium: 144 mmol/L (ref 135–145)
Total Bilirubin: 0.4 mg/dL (ref 0.3–1.2)
Total Protein: 5.3 g/dL — ABNORMAL LOW (ref 6.5–8.1)

## 2021-07-14 LAB — TSH: TSH: 7.239 u[IU]/mL — ABNORMAL HIGH (ref 0.350–4.500)

## 2021-07-14 LAB — URINALYSIS, MICROSCOPIC (REFLEX)

## 2021-07-14 LAB — HEMOGLOBIN A1C
Hgb A1c MFr Bld: 5.5 % (ref 4.8–5.6)
Mean Plasma Glucose: 111.15 mg/dL

## 2021-07-14 LAB — MAGNESIUM: Magnesium: 1.5 mg/dL — ABNORMAL LOW (ref 1.7–2.4)
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
## 2021-07-15 DIAGNOSIS — R6521 Severe sepsis with septic shock: Secondary | ICD-10-CM | POA: Diagnosis not present

## 2021-07-15 DIAGNOSIS — J9600 Acute respiratory failure, unspecified whether with hypoxia or hypercapnia: Secondary | ICD-10-CM | POA: Diagnosis not present

## 2021-07-15 DIAGNOSIS — J9621 Acute and chronic respiratory failure with hypoxia: Secondary | ICD-10-CM | POA: Diagnosis not present

## 2021-07-15 DIAGNOSIS — A419 Sepsis, unspecified organism: Secondary | ICD-10-CM | POA: Diagnosis not present

## 2021-07-15 DIAGNOSIS — G9340 Encephalopathy, unspecified: Secondary | ICD-10-CM | POA: Diagnosis not present

## 2021-07-15 DIAGNOSIS — K559 Vascular disorder of intestine, unspecified: Secondary | ICD-10-CM | POA: Diagnosis not present

## 2021-07-15 DIAGNOSIS — J690 Pneumonitis due to inhalation of food and vomit: Secondary | ICD-10-CM | POA: Diagnosis not present

## 2021-07-15 DIAGNOSIS — E119 Type 2 diabetes mellitus without complications: Secondary | ICD-10-CM | POA: Diagnosis not present

## 2021-07-15 DIAGNOSIS — Z930 Tracheostomy status: Secondary | ICD-10-CM | POA: Diagnosis not present

## 2021-07-15 DIAGNOSIS — N179 Acute kidney failure, unspecified: Secondary | ICD-10-CM | POA: Diagnosis not present

## 2021-07-15 LAB — PHOSPHORUS: Phosphorus: 3.7 mg/dL (ref 2.5–4.6)

## 2021-07-15 LAB — BASIC METABOLIC PANEL
Anion gap: 8 (ref 5–15)
BUN: 10 mg/dL (ref 8–23)
CO2: 31 mmol/L (ref 22–32)
Calcium: 8.4 mg/dL — ABNORMAL LOW (ref 8.9–10.3)
Chloride: 104 mmol/L (ref 98–111)
Creatinine, Ser: 0.33 mg/dL — ABNORMAL LOW (ref 0.44–1.00)
GFR, Estimated: >60 mL/min (ref >60)
Glucose, Bld: 166 mg/dL — ABNORMAL HIGH (ref 70–99)
Potassium: 3.4 mmol/L — ABNORMAL LOW (ref 3.5–5.1)
Sodium: 143 mmol/L (ref 135–145)

## 2021-07-15 LAB — CULTURE, RESPIRATORY W GRAM STAIN

## 2021-07-15 LAB — POTASSIUM: Potassium: 3.9 mmol/L (ref 3.5–5.1)

## 2021-07-15 LAB — CBC
HCT: 29.2 % — ABNORMAL LOW (ref 36.0–46.0)
Hemoglobin: 9.1 g/dL — ABNORMAL LOW (ref 12.0–15.0)
MCH: 33.5 pg (ref 26.0–34.0)
MCHC: 31.2 g/dL (ref 30.0–36.0)
MCV: 107.4 fL — ABNORMAL HIGH (ref 80.0–100.0)
Platelets: 439 10*3/uL — ABNORMAL HIGH (ref 150–400)
RBC: 2.72 MIL/uL — ABNORMAL LOW (ref 3.87–5.11)
RDW: 23.4 % — ABNORMAL HIGH (ref 11.5–15.5)
WBC: 15.9 10*3/uL — ABNORMAL HIGH (ref 4.0–10.5)
nRBC: 0.8 % — ABNORMAL HIGH (ref 0.0–0.2)

## 2021-07-15 LAB — MAGNESIUM
Magnesium: 1.7 mg/dL (ref 1.7–2.4)
Magnesium: 1.9 mg/dL (ref 1.7–2.4)

## 2021-07-15 NOTE — Progress Notes (Signed)
Pulmonary Critical Care Medicine [facility] [place]   PULMONARY CRITICAL CARE SERVICE  PROGRESS NOTE     Cloa Bushong  URK:[id number]  DOB: 1951/10/10   DOA: 07/13/2021  Referring Physician: Carron Curie, MD  HPI: Tondra Reierson is a 70 y.o. female being followed for ventilator/airway/oxygen weaning Acute on Chronic Respiratory Failure.  At this time patient is afebrile right now comfortable without distress Patient has been on the T collar on 35% FiO2 speech is supposed to see the patient  Medications: Reviewed on Rounds  Physical Exam:  Vitals: Temperature is 97.3 pulse 82 respiratory 23 blood pressure is 180/73 saturations 100%  Ventilator Settings on T collar FiO2 35%  General: Comfortable at this time Neck: supple Cardiovascular: no malignant arrhythmias Respiratory: No rhonchi very coarse breath sounds Skin: no rash seen on limited exam Musculoskeletal: No gross abnormality Psychiatric:unable to assess Neurologic:no involuntary movements         Lab Data:   Basic Metabolic Panel: Recent Labs  Lab 07/14/21 0344 07/15/21 0416  NA 144 143  K 3.7 3.4*  CL 104 104  CO2 33* 31  GLUCOSE 151* 166*  BUN 12 10  CREATININE 0.34* 0.33*  CALCIUM 8.8* 8.4*  MG 1.5* 1.7  PHOS 3.6 3.7    ABG: Recent Labs  Lab 07/13/21 1430  PHART 7.410  PCO2ART 49.2*  PO2ART 74.8*  HCO3 30.6*  O2SAT 95.0    Liver Function Tests: Recent Labs  Lab 07/14/21 0344  AST 38  ALT 53*  ALKPHOS 164*  BILITOT 0.4  PROT 5.3*  ALBUMIN 1.6*   No results for input(s): LIPASE, AMYLASE in the last 168 hours. No results for input(s): AMMONIA in the last 168 hours.  CBC: Recent Labs  Lab 07/14/21 0344 07/15/21 0416  WBC 17.5* 15.9*  NEUTROABS 16.1*  --   HGB 9.5* 9.1*  HCT 31.2* 29.2*  MCV 107.6* 107.4*  PLT 472* 439*    Cardiac Enzymes: No results for input(s): CKTOTAL, CKMB, CKMBINDEX, TROPONINI in the last 168 hours.  BNP (last 3 results) No results for  input(s): BNP in the last 8760 hours.  ProBNP (last 3 results) No results for input(s): PROBNP in the last 8760 hours.  Radiological Exams: DG Abd 1 View  Result Date: 07/13/2021 CLINICAL DATA:  Pneumonia.  Peg tube placement. EXAM: ABDOMEN - 1 VIEW COMPARISON:  Chest x-ray of earlier today FINDINGS: Peg tube has been injected with contrast which fills normal appearing gastric lumen. Visualized bowel gas pattern is nonobstructive. There is no evidence for free intraperitoneal air or contrast leak. IMPRESSION: Gastrostomy tube tip positioned in the stomach. Electronically Signed   By: Norva Pavlov M.D.   On: 07/13/2021 15:27   DG CHEST PORT 1 VIEW  Result Date: 07/13/2021 CLINICAL DATA:  Pneumonia evaluation EXAM: PORTABLE CHEST 1 VIEW COMPARISON:  None. FINDINGS: Cardiomegaly. Tracheostomy. Right upper extremity PICC, tip positioned over the lower SVC. Heterogeneous airspace opacity of the left lung base. Probable small bilateral layering pleural effusions. The visualized skeletal structures are unremarkable. IMPRESSION: 1. Heterogeneous airspace opacity of the left lung base, which may reflect atelectasis and or infection. 2. Probable small bilateral layering pleural effusions. 3. Tracheostomy. 4. Cardiomegaly. Electronically Signed   By: Lauralyn Primes M.D.   On: 07/13/2021 15:27    Assessment/Plan Active Problems:   Acute on chronic respiratory failure with hypoxia (HCC)   Severe sepsis with septic shock (HCC)   Aspiration pneumonia of both lower lobes due to gastric secretions (HCC)  Ischemic bowel syndrome (HCC)   Tracheostomy status (HCC)   Acute on chronic respiratory failure hypoxia patient currently is on T collar has been on 35% FiO2 speech to evaluate today before we begin with PMV and capping of potentially Severe sepsis and shock treated hemodynamics are stable we will continue to monitor closely. Aspiration pneumonia supportive care patient has been treated with  antibiotics Ischemic bowel syndrome status post resection Tracheostomy secretions are copious limiting factor as far as weaning at this time   I have personally seen and evaluated the patient, evaluated laboratory and imaging results, formulated the assessment and plan and placed orders. The Patient requires high complexity decision making with multiple systems involvement.  Rounds were done with the Respiratory Therapy Director and Staff therapists and discussed with nursing staff also.  Yevonne Pax, MD [facility] Pulmonary Critical Care Medicine Sleep Medicine

## 2021-07-16 DIAGNOSIS — J9600 Acute respiratory failure, unspecified whether with hypoxia or hypercapnia: Secondary | ICD-10-CM | POA: Diagnosis not present

## 2021-07-16 DIAGNOSIS — J9621 Acute and chronic respiratory failure with hypoxia: Secondary | ICD-10-CM | POA: Diagnosis not present

## 2021-07-16 DIAGNOSIS — Z930 Tracheostomy status: Secondary | ICD-10-CM

## 2021-07-16 DIAGNOSIS — R6521 Severe sepsis with septic shock: Secondary | ICD-10-CM | POA: Diagnosis not present

## 2021-07-16 DIAGNOSIS — K559 Vascular disorder of intestine, unspecified: Secondary | ICD-10-CM | POA: Diagnosis not present

## 2021-07-16 DIAGNOSIS — G9340 Encephalopathy, unspecified: Secondary | ICD-10-CM | POA: Diagnosis not present

## 2021-07-16 DIAGNOSIS — A419 Sepsis, unspecified organism: Secondary | ICD-10-CM | POA: Diagnosis not present

## 2021-07-16 DIAGNOSIS — J690 Pneumonitis due to inhalation of food and vomit: Secondary | ICD-10-CM

## 2021-07-16 DIAGNOSIS — N179 Acute kidney failure, unspecified: Secondary | ICD-10-CM | POA: Diagnosis not present

## 2021-07-16 DIAGNOSIS — I10 Essential (primary) hypertension: Secondary | ICD-10-CM | POA: Diagnosis not present

## 2021-07-16 LAB — OCCULT BLOOD X 1 CARD TO LAB, STOOL: Fecal Occult Bld: NEGATIVE

## 2021-07-16 LAB — CBC
HCT: 30.9 % — ABNORMAL LOW (ref 36.0–46.0)
Hemoglobin: 9.4 g/dL — ABNORMAL LOW (ref 12.0–15.0)
MCH: 33 pg (ref 26.0–34.0)
MCHC: 30.4 g/dL (ref 30.0–36.0)
MCV: 108.4 fL — ABNORMAL HIGH (ref 80.0–100.0)
Platelets: 458 K/uL — ABNORMAL HIGH (ref 150–400)
RBC: 2.85 MIL/uL — ABNORMAL LOW (ref 3.87–5.11)
RDW: 22.8 % — ABNORMAL HIGH (ref 11.5–15.5)
WBC: 15.4 K/uL — ABNORMAL HIGH (ref 4.0–10.5)
nRBC: 0.6 % — ABNORMAL HIGH (ref 0.0–0.2)

## 2021-07-16 LAB — URINE CULTURE: Culture: >=100000 — AB

## 2021-07-16 LAB — MAGNESIUM: Magnesium: 1.8 mg/dL (ref 1.7–2.4)

## 2021-07-16 LAB — POTASSIUM: Potassium: 3.5 mmol/L (ref 3.5–5.1)

## 2021-07-16 NOTE — Progress Notes (Signed)
Pulmonary Critical Care Medicine [facility] [place]   PULMONARY CRITICAL CARE SERVICE  PROGRESS NOTE     Leslie Blevins  QPY:[id number]  DOB: [birth year]   DOA: 07/13/2021  Referring Physician: Carron Curie, MD  HPI: Leslie Blevins is a 70 y.o. female being followed for ventilator/airway/oxygen weaning Acute on Chronic Respiratory Failure.  Patient is on T collar currently on 35% FiO2 copious secretions still noted  Medications: Reviewed on Rounds  Physical Exam:  Vitals: Temperature is 96.1 pulse 78 respiratory rate is 20 blood pressure is 134/72 saturations 100%  Ventilator Settings on T collar FiO2 35%  General: Comfortable at this time Neck: supple Cardiovascular: no malignant arrhythmias Respiratory: No rhonchi very coarse breath sounds Skin: no rash seen on limited exam Musculoskeletal: No gross abnormality Psychiatric:unable to assess Neurologic:no involuntary movements         Lab Data:   Basic Metabolic Panel: Recent Labs  Lab 07/14/21 0344 07/15/21 0416 07/15/21 1952 07/16/21 0342  NA 144 143  --   --   K 3.7 3.4* 3.9 3.5  CL 104 104  --   --   CO2 33* 31  --   --   GLUCOSE 151* 166*  --   --   BUN 12 10  --   --   CREATININE 0.34* 0.33*  --   --   CALCIUM 8.8* 8.4*  --   --   MG 1.5* 1.7 1.9 1.8  PHOS 3.6 3.7  --   --     ABG: Recent Labs  Lab 07/13/21 1430  PHART 7.410  PCO2ART 49.2*  PO2ART 74.8*  HCO3 30.6*  O2SAT 95.0    Liver Function Tests: Recent Labs  Lab 07/14/21 0344  AST 38  ALT 53*  ALKPHOS 164*  BILITOT 0.4  PROT 5.3*  ALBUMIN 1.6*   No results for input(s): LIPASE, AMYLASE in the last 168 hours. No results for input(s): AMMONIA in the last 168 hours.  CBC: Recent Labs  Lab 07/14/21 0344 07/15/21 0416 07/16/21 0342  WBC 17.5* 15.9* 15.4*  NEUTROABS 16.1*  --   --   HGB 9.5* 9.1* 9.4*  HCT 31.2* 29.2* 30.9*  MCV 107.6* 107.4* 108.4*  PLT 472* 439* 458*    Cardiac Enzymes: No results for input(s):  CKTOTAL, CKMB, CKMBINDEX, TROPONINI in the last 168 hours.  BNP (last 3 results) No results for input(s): BNP in the last 8760 hours.  ProBNP (last 3 results) No results for input(s): PROBNP in the last 8760 hours.  Radiological Exams: No results found.  Assessment/Plan Active Problems:   Acute on chronic respiratory failure with hypoxia (HCC)   Severe sepsis with septic shock (HCC)   Aspiration pneumonia of both lower lobes due to gastric secretions (HCC)   Ischemic bowel syndrome (HCC)   Tracheostomy status (HCC)   Acute on chronic respiratory failure hypoxia we will continue with T collar patient is on 35% FiO2 secretions are quite copious Severe sepsis with shock hemodynamics are stable Aspiration pneumonia treated we will continue to follow along closely Ischemic bowel disease supportive care status post resection Tracheostomy will need to remain in place   I have personally seen and evaluated the patient, evaluated laboratory and imaging results, formulated the assessment and plan and placed orders. The Patient requires high complexity decision making with multiple systems involvement.  Rounds were done with the Respiratory Therapy Director and Staff therapists and discussed with nursing staff also.  Yevonne Pax, MD [facility] Pulmonary Critical Care  Medicine Sleep Medicine

## 2021-07-17 DIAGNOSIS — G9340 Encephalopathy, unspecified: Secondary | ICD-10-CM | POA: Diagnosis not present

## 2021-07-17 DIAGNOSIS — R6521 Severe sepsis with septic shock: Secondary | ICD-10-CM | POA: Diagnosis not present

## 2021-07-17 DIAGNOSIS — J9621 Acute and chronic respiratory failure with hypoxia: Secondary | ICD-10-CM | POA: Diagnosis not present

## 2021-07-17 DIAGNOSIS — J690 Pneumonitis due to inhalation of food and vomit: Secondary | ICD-10-CM | POA: Diagnosis not present

## 2021-07-17 DIAGNOSIS — A419 Sepsis, unspecified organism: Secondary | ICD-10-CM | POA: Diagnosis not present

## 2021-07-17 DIAGNOSIS — K559 Vascular disorder of intestine, unspecified: Secondary | ICD-10-CM | POA: Diagnosis not present

## 2021-07-17 DIAGNOSIS — J9600 Acute respiratory failure, unspecified whether with hypoxia or hypercapnia: Secondary | ICD-10-CM | POA: Diagnosis not present

## 2021-07-17 DIAGNOSIS — E119 Type 2 diabetes mellitus without complications: Secondary | ICD-10-CM | POA: Diagnosis not present

## 2021-07-17 DIAGNOSIS — N179 Acute kidney failure, unspecified: Secondary | ICD-10-CM | POA: Diagnosis not present

## 2021-07-17 DIAGNOSIS — Z930 Tracheostomy status: Secondary | ICD-10-CM | POA: Diagnosis not present

## 2021-07-17 NOTE — Progress Notes (Signed)
Pulmonary Critical Care Medicine [facility] [place]   PULMONARY CRITICAL CARE SERVICE  PROGRESS NOTE     Leslie Blevins  SVX:[id number]  DOB: September 04, 1951   DOA: 07/13/2021  Referring Physician: Carron Curie, MD  HPI: Leslie Blevins is a 70 y.o. female being followed for ventilator/airway/oxygen weaning Acute on Chronic Respiratory Failure.  Patient is afebrile right now resting comfortably without any distress.  Medications: Reviewed on Rounds  Physical Exam:  Vitals: Temperature is 97.2 pulse 81 respiratory 16 blood pressure is 162/88 saturations 100%  Ventilator Settings on T collar FiO2 is 35%  General: Comfortable at this time Neck: supple Cardiovascular: no malignant arrhythmias Respiratory: No rhonchi very coarse breath sounds Skin: no rash seen on limited exam Musculoskeletal: No gross abnormality Psychiatric:unable to assess Neurologic:no involuntary movements         Lab Data:   Basic Metabolic Panel: Recent Labs  Lab 07/14/21 0344 07/15/21 0416 07/15/21 1952 07/16/21 0342  NA 144 143  --   --   K 3.7 3.4* 3.9 3.5  CL 104 104  --   --   CO2 33* 31  --   --   GLUCOSE 151* 166*  --   --   BUN 12 10  --   --   CREATININE 0.34* 0.33*  --   --   CALCIUM 8.8* 8.4*  --   --   MG 1.5* 1.7 1.9 1.8  PHOS 3.6 3.7  --   --     ABG: Recent Labs  Lab 07/13/21 1430  PHART 7.410  PCO2ART 49.2*  PO2ART 74.8*  HCO3 30.6*  O2SAT 95.0    Liver Function Tests: Recent Labs  Lab 07/14/21 0344  AST 38  ALT 53*  ALKPHOS 164*  BILITOT 0.4  PROT 5.3*  ALBUMIN 1.6*   No results for input(s): LIPASE, AMYLASE in the last 168 hours. No results for input(s): AMMONIA in the last 168 hours.  CBC: Recent Labs  Lab 07/14/21 0344 07/15/21 0416 07/16/21 0342  WBC 17.5* 15.9* 15.4*  NEUTROABS 16.1*  --   --   HGB 9.5* 9.1* 9.4*  HCT 31.2* 29.2* 30.9*  MCV 107.6* 107.4* 108.4*  PLT 472* 439* 458*    Cardiac Enzymes: No results for input(s): CKTOTAL,  CKMB, CKMBINDEX, TROPONINI in the last 168 hours.  BNP (last 3 results) No results for input(s): BNP in the last 8760 hours.  ProBNP (last 3 results) No results for input(s): PROBNP in the last 8760 hours.  Radiological Exams: No results found.  Assessment/Plan Active Problems:   Acute on chronic respiratory failure with hypoxia (HCC)   Severe sepsis with septic shock (HCC)   Aspiration pneumonia of both lower lobes due to gastric secretions (HCC)   Ischemic bowel syndrome (HCC)   Tracheostomy status (HCC)   Acute on chronic respiratory failure hypoxia patient right now is off the ventilator on T collar limiting factor for further weaning is secretions Severe sepsis with shock no change we will continue to monitor closely. Aspiration pneumonia remains at risk for further aspiration Ischemic bowel no change continue with present management Tracheostomy remains in place   I have personally seen and evaluated the patient, evaluated laboratory and imaging results, formulated the assessment and plan and placed orders. The Patient requires high complexity decision making with multiple systems involvement.  Rounds were done with the Respiratory Therapy Director and Staff therapists and discussed with nursing staff also.  Yevonne Pax, MD [facility] Pulmonary Critical Care Medicine Sleep  Medicine

## 2021-07-18 DIAGNOSIS — J9621 Acute and chronic respiratory failure with hypoxia: Secondary | ICD-10-CM | POA: Diagnosis not present

## 2021-07-18 DIAGNOSIS — N179 Acute kidney failure, unspecified: Secondary | ICD-10-CM | POA: Diagnosis not present

## 2021-07-18 DIAGNOSIS — E119 Type 2 diabetes mellitus without complications: Secondary | ICD-10-CM | POA: Diagnosis not present

## 2021-07-18 DIAGNOSIS — J9600 Acute respiratory failure, unspecified whether with hypoxia or hypercapnia: Secondary | ICD-10-CM | POA: Diagnosis not present

## 2021-07-18 DIAGNOSIS — K559 Vascular disorder of intestine, unspecified: Secondary | ICD-10-CM | POA: Diagnosis not present

## 2021-07-18 DIAGNOSIS — Z930 Tracheostomy status: Secondary | ICD-10-CM | POA: Diagnosis not present

## 2021-07-18 DIAGNOSIS — A419 Sepsis, unspecified organism: Secondary | ICD-10-CM | POA: Diagnosis not present

## 2021-07-18 DIAGNOSIS — R6521 Severe sepsis with septic shock: Secondary | ICD-10-CM | POA: Diagnosis not present

## 2021-07-18 DIAGNOSIS — J690 Pneumonitis due to inhalation of food and vomit: Secondary | ICD-10-CM | POA: Diagnosis not present

## 2021-07-18 DIAGNOSIS — G9340 Encephalopathy, unspecified: Secondary | ICD-10-CM | POA: Diagnosis not present

## 2021-07-18 NOTE — Progress Notes (Signed)
Pulmonary Critical Care Medicine [facility] [place]   PULMONARY CRITICAL CARE SERVICE  PROGRESS NOTE     Leslie Blevins  [id number]  DOB: 12/30/1950   DOA: 07/13/2021  Referring Physician: Carron Curie, MD  HPI: Leslie Blevins is a 70 y.o. female being followed for ventilator/airway/oxygen weaning Acute on Chronic Respiratory Failure.  Patient is comfortable right now without distress has been on 28% FiO2 good saturations  Medications: Reviewed on Rounds  Physical Exam:  Vitals: Temperature is 96.8 pulse 81 respiratory 25 blood pressure is 147/68 saturations 97%  Ventilator Settings on T collar FiO2 is 28%  General: Comfortable at this time Neck: supple Cardiovascular: no malignant arrhythmias Respiratory: No rhonchi very coarse breath sounds Skin: no rash seen on limited exam Musculoskeletal: No gross abnormality Psychiatric:unable to assess Neurologic:no involuntary movements         Lab Data:   Basic Metabolic Panel: Recent Labs  Lab 07/14/21 0344 07/15/21 0416 07/15/21 1952 07/16/21 0342  NA 144 143  --   --   K 3.7 3.4* 3.9 3.5  CL 104 104  --   --   CO2 33* 31  --   --   GLUCOSE 151* 166*  --   --   BUN 12 10  --   --   CREATININE 0.34* 0.33*  --   --   CALCIUM 8.8* 8.4*  --   --   MG 1.5* 1.7 1.9 1.8  PHOS 3.6 3.7  --   --     ABG: Recent Labs  Lab 07/13/21 1430  PHART 7.410  PCO2ART 49.2*  PO2ART 74.8*  HCO3 30.6*  O2SAT 95.0    Liver Function Tests: Recent Labs  Lab 07/14/21 0344  AST 38  ALT 53*  ALKPHOS 164*  BILITOT 0.4  PROT 5.3*  ALBUMIN 1.6*   No results for input(s): LIPASE, AMYLASE in the last 168 hours. No results for input(s): AMMONIA in the last 168 hours.  CBC: Recent Labs  Lab 07/14/21 0344 07/15/21 0416 07/16/21 0342  WBC 17.5* 15.9* 15.4*  NEUTROABS 16.1*  --   --   HGB 9.5* 9.1* 9.4*  HCT 31.2* 29.2* 30.9*  MCV 107.6* 107.4* 108.4*  PLT 472* 439* 458*    Cardiac Enzymes: No results for  input(s): CKTOTAL, CKMB, CKMBINDEX, TROPONINI in the last 168 hours.  BNP (last 3 results) No results for input(s): BNP in the last 8760 hours.  ProBNP (last 3 results) No results for input(s): PROBNP in the last 8760 hours.  Radiological Exams: No results found.  Assessment/Plan Active Problems:   Acute on chronic respiratory failure with hypoxia (HCC)   Severe sepsis with septic shock (HCC)   Aspiration pneumonia of both lower lobes due to gastric secretions (HCC)   Ischemic bowel syndrome (HCC)   Tracheostomy status (HCC)   Acute on chronic respiratory failure with hypoxia plan is going to continue with the T collar secretions are limiting factor as far as advancing the weaning Severe sepsis with shock hemodynamics are stable in resolution Aspiration pneumonia treated Ischemic bowel syndrome no change supportive care status post resection Tracheostomy remains in place   I have personally seen and evaluated the patient, evaluated laboratory and imaging results, formulated the assessment and plan and placed orders. The Patient requires high complexity decision making with multiple systems involvement.  Rounds were done with the Respiratory Therapy Director and Staff therapists and discussed with nursing staff also.  Leslie Pax, MD [facility] Pulmonary Critical Care Medicine  Sleep Medicine

## 2021-07-19 DIAGNOSIS — R6521 Severe sepsis with septic shock: Secondary | ICD-10-CM | POA: Diagnosis not present

## 2021-07-19 DIAGNOSIS — G9340 Encephalopathy, unspecified: Secondary | ICD-10-CM | POA: Diagnosis not present

## 2021-07-19 DIAGNOSIS — E119 Type 2 diabetes mellitus without complications: Secondary | ICD-10-CM | POA: Diagnosis not present

## 2021-07-19 DIAGNOSIS — K559 Vascular disorder of intestine, unspecified: Secondary | ICD-10-CM | POA: Diagnosis not present

## 2021-07-19 DIAGNOSIS — Z930 Tracheostomy status: Secondary | ICD-10-CM | POA: Diagnosis not present

## 2021-07-19 DIAGNOSIS — J690 Pneumonitis due to inhalation of food and vomit: Secondary | ICD-10-CM | POA: Diagnosis not present

## 2021-07-19 DIAGNOSIS — N179 Acute kidney failure, unspecified: Secondary | ICD-10-CM | POA: Diagnosis not present

## 2021-07-19 DIAGNOSIS — J9621 Acute and chronic respiratory failure with hypoxia: Secondary | ICD-10-CM | POA: Diagnosis not present

## 2021-07-19 DIAGNOSIS — J9600 Acute respiratory failure, unspecified whether with hypoxia or hypercapnia: Secondary | ICD-10-CM | POA: Diagnosis not present

## 2021-07-19 DIAGNOSIS — A419 Sepsis, unspecified organism: Secondary | ICD-10-CM | POA: Diagnosis not present

## 2021-07-19 LAB — CBC
HCT: 34.3 % — ABNORMAL LOW (ref 36.0–46.0)
Hemoglobin: 10.9 g/dL — ABNORMAL LOW (ref 12.0–15.0)
MCH: 33.5 pg (ref 26.0–34.0)
MCHC: 31.8 g/dL (ref 30.0–36.0)
MCV: 105.5 fL — ABNORMAL HIGH (ref 80.0–100.0)
Platelets: 473 10*3/uL — ABNORMAL HIGH (ref 150–400)
RBC: 3.25 MIL/uL — ABNORMAL LOW (ref 3.87–5.11)
RDW: 21.3 % — ABNORMAL HIGH (ref 11.5–15.5)
WBC: 20 10*3/uL — ABNORMAL HIGH (ref 4.0–10.5)
nRBC: 0.4 % — ABNORMAL HIGH (ref 0.0–0.2)

## 2021-07-19 NOTE — Progress Notes (Signed)
Pulmonary Critical Care Medicine [facility] [place]   PULMONARY CRITICAL CARE SERVICE  PROGRESS NOTE     Leslie Blevins  [id number]  DOB: [birth year]   DOA: 07/13/2021  Referring Physician: Carron Curie, MD  HPI: Leslie Blevins is a 70 y.o. female being followed for ventilator/airway/oxygen weaning Acute on Chronic Respiratory Failure.  At this time patient is resting comfortably without any distress has been on T collar currently on 28% FiO2  Medications: Reviewed on Rounds  Physical Exam:  Vitals: Temperature is 98.8 pulse 82 respiratory is 20 blood pressure is 138/67 saturations 98%  Ventilator Settings on T collar with an FiO2 of 28%  General: Comfortable at this time Neck: supple Cardiovascular: no malignant arrhythmias Respiratory: Scattered rhonchi expansion is equal Skin: no rash seen on limited exam Musculoskeletal: No gross abnormality Psychiatric:unable to assess Neurologic:no involuntary movements         Lab Data:   Basic Metabolic Panel: Recent Labs  Lab 07/14/21 0344 07/15/21 0416 07/15/21 1952 07/16/21 0342  NA 144 143  --   --   K 3.7 3.4* 3.9 3.5  CL 104 104  --   --   CO2 33* 31  --   --   GLUCOSE 151* 166*  --   --   BUN 12 10  --   --   CREATININE 0.34* 0.33*  --   --   CALCIUM 8.8* 8.4*  --   --   MG 1.5* 1.7 1.9 1.8  PHOS 3.6 3.7  --   --     ABG: Recent Labs  Lab 07/13/21 1430  PHART 7.410  PCO2ART 49.2*  PO2ART 74.8*  HCO3 30.6*  O2SAT 95.0    Liver Function Tests: Recent Labs  Lab 07/14/21 0344  AST 38  ALT 53*  ALKPHOS 164*  BILITOT 0.4  PROT 5.3*  ALBUMIN 1.6*   No results for input(s): LIPASE, AMYLASE in the last 168 hours. No results for input(s): AMMONIA in the last 168 hours.  CBC: Recent Labs  Lab 07/14/21 0344 07/15/21 0416 07/16/21 0342 07/19/21 0802  WBC 17.5* 15.9* 15.4* 20.0*  NEUTROABS 16.1*  --   --   --   HGB 9.5* 9.1* 9.4* 10.9*  HCT 31.2* 29.2* 30.9* 34.3*  MCV 107.6* 107.4*  108.4* 105.5*  PLT 472* 439* 458* 473*    Cardiac Enzymes: No results for input(s): CKTOTAL, CKMB, CKMBINDEX, TROPONINI in the last 168 hours.  BNP (last 3 results) No results for input(s): BNP in the last 8760 hours.  ProBNP (last 3 results) No results for input(s): PROBNP in the last 8760 hours.  Radiological Exams: No results found.  Assessment/Plan Active Problems:   Acute on chronic respiratory failure with hypoxia (HCC)   Severe sepsis with septic shock (HCC)   Aspiration pneumonia of both lower lobes due to gastric secretions (HCC)   Ischemic bowel syndrome (HCC)   Tracheostomy status (HCC)   Acute on chronic respiratory failure with hypoxia we will continue with T collar trials currently on 28% FiO2 continue to follow along because of the secretions is not a candidate for decannulation at this time Severe sepsis treated resolved Aspiration pneumonia getting treated remains at risk for aspiration events Ischemic bowel disease no change supportive care status post resection Tracheostomy will remain in place   I have personally seen and evaluated the patient, evaluated laboratory and imaging results, formulated the assessment and plan and placed orders. The Patient requires high complexity decision making with multiple systems  involvement.  Rounds were done with the Respiratory Therapy Director and Staff therapists and discussed with nursing staff also.  Allyne Gee, MD [facility] Pulmonary Critical Care Medicine Sleep Medicine

## 2021-07-20 ENCOUNTER — Other Ambulatory Visit (HOSPITAL_COMMUNITY): Payer: PPO

## 2021-07-20 DIAGNOSIS — G9340 Encephalopathy, unspecified: Secondary | ICD-10-CM | POA: Diagnosis not present

## 2021-07-20 DIAGNOSIS — N179 Acute kidney failure, unspecified: Secondary | ICD-10-CM | POA: Diagnosis not present

## 2021-07-20 DIAGNOSIS — R0602 Shortness of breath: Secondary | ICD-10-CM | POA: Diagnosis not present

## 2021-07-20 DIAGNOSIS — E119 Type 2 diabetes mellitus without complications: Secondary | ICD-10-CM | POA: Diagnosis not present

## 2021-07-20 DIAGNOSIS — J9600 Acute respiratory failure, unspecified whether with hypoxia or hypercapnia: Secondary | ICD-10-CM | POA: Diagnosis not present

## 2021-07-20 DIAGNOSIS — J90 Pleural effusion, not elsewhere classified: Secondary | ICD-10-CM | POA: Diagnosis not present

## 2021-07-20 LAB — BASIC METABOLIC PANEL
Anion gap: 7 (ref 5–15)
BUN: 21 mg/dL (ref 8–23)
CO2: 34 mmol/L — ABNORMAL HIGH (ref 22–32)
Calcium: 8.4 mg/dL — ABNORMAL LOW (ref 8.9–10.3)
Chloride: 101 mmol/L (ref 98–111)
Creatinine, Ser: 0.49 mg/dL (ref 0.44–1.00)
GFR, Estimated: >60 mL/min (ref >60)
Glucose, Bld: 164 mg/dL — ABNORMAL HIGH (ref 70–99)
Potassium: 3 mmol/L — ABNORMAL LOW (ref 3.5–5.1)
Sodium: 142 mmol/L (ref 135–145)

## 2021-07-20 LAB — CBC
HCT: 32.1 % — ABNORMAL LOW (ref 36.0–46.0)
Hemoglobin: 9.6 g/dL — ABNORMAL LOW (ref 12.0–15.0)
MCH: 32.8 pg (ref 26.0–34.0)
MCHC: 29.9 g/dL — ABNORMAL LOW (ref 30.0–36.0)
MCV: 109.6 fL — ABNORMAL HIGH (ref 80.0–100.0)
Platelets: 540 10*3/uL — ABNORMAL HIGH (ref 150–400)
RBC: 2.93 MIL/uL — ABNORMAL LOW (ref 3.87–5.11)
RDW: 21.6 % — ABNORMAL HIGH (ref 11.5–15.5)
WBC: 25.2 10*3/uL — ABNORMAL HIGH (ref 4.0–10.5)
nRBC: 0.8 % — ABNORMAL HIGH (ref 0.0–0.2)

## 2021-07-20 LAB — MAGNESIUM: Magnesium: 1.6 mg/dL — ABNORMAL LOW (ref 1.7–2.4)

## 2021-07-21 DIAGNOSIS — G9340 Encephalopathy, unspecified: Secondary | ICD-10-CM | POA: Diagnosis not present

## 2021-07-21 DIAGNOSIS — A419 Sepsis, unspecified organism: Secondary | ICD-10-CM | POA: Diagnosis not present

## 2021-07-21 DIAGNOSIS — J9621 Acute and chronic respiratory failure with hypoxia: Secondary | ICD-10-CM | POA: Diagnosis not present

## 2021-07-21 DIAGNOSIS — Z930 Tracheostomy status: Secondary | ICD-10-CM | POA: Diagnosis not present

## 2021-07-21 DIAGNOSIS — J9600 Acute respiratory failure, unspecified whether with hypoxia or hypercapnia: Secondary | ICD-10-CM | POA: Diagnosis not present

## 2021-07-21 DIAGNOSIS — N179 Acute kidney failure, unspecified: Secondary | ICD-10-CM | POA: Diagnosis not present

## 2021-07-21 DIAGNOSIS — E119 Type 2 diabetes mellitus without complications: Secondary | ICD-10-CM | POA: Diagnosis not present

## 2021-07-21 DIAGNOSIS — J690 Pneumonitis due to inhalation of food and vomit: Secondary | ICD-10-CM | POA: Diagnosis not present

## 2021-07-21 DIAGNOSIS — R6521 Severe sepsis with septic shock: Secondary | ICD-10-CM | POA: Diagnosis not present

## 2021-07-21 DIAGNOSIS — K559 Vascular disorder of intestine, unspecified: Secondary | ICD-10-CM | POA: Diagnosis not present

## 2021-07-21 NOTE — Progress Notes (Signed)
Pulmonary Critical Care Medicine [facility] [place]   PULMONARY CRITICAL CARE SERVICE  PROGRESS NOTE     Leslie Blevins  [id number]  DOB: Oct 01, 1951   DOA: 07/13/2021  Referring Physician: Carron Curie, MD  HPI: Leslie Blevins is a 70 y.o. female being followed for ventilator/airway/oxygen weaning Acute on Chronic Respiratory Failure.  Patient at this time is on T collar reportedly secretions are somewhat better  Medications: Reviewed on Rounds  Physical Exam:  Vitals: Temperature is 97.6 pulse 80 respiratory 24 blood pressure is 122/56 saturations 96%  Ventilator Settings on T collar with an FiO2 28%  General: Comfortable at this time Neck: supple Cardiovascular: no malignant arrhythmias Respiratory: No rhonchi very coarse breath sounds Skin: no rash seen on limited exam Musculoskeletal: No gross abnormality Psychiatric:unable to assess Neurologic:no involuntary movements         Lab Data:   Basic Metabolic Panel: Recent Labs  Lab 07/15/21 0416 07/15/21 1952 07/16/21 0342 07/20/21 0730  NA 143  --   --  142  K 3.4* 3.9 3.5 3.0*  CL 104  --   --  101  CO2 31  --   --  34*  GLUCOSE 166*  --   --  164*  BUN 10  --   --  21  CREATININE 0.33*  --   --  0.49  CALCIUM 8.4*  --   --  8.4*  MG 1.7 1.9 1.8 1.6*  PHOS 3.7  --   --   --     ABG: No results for input(s): PHART, PCO2ART, PO2ART, HCO3, O2SAT in the last 168 hours.  Liver Function Tests: No results for input(s): AST, ALT, ALKPHOS, BILITOT, PROT, ALBUMIN in the last 168 hours. No results for input(s): LIPASE, AMYLASE in the last 168 hours. No results for input(s): AMMONIA in the last 168 hours.  CBC: Recent Labs  Lab 07/15/21 0416 07/16/21 0342 07/19/21 0802 07/20/21 0730  WBC 15.9* 15.4* 20.0* 25.2*  HGB 9.1* 9.4* 10.9* 9.6*  HCT 29.2* 30.9* 34.3* 32.1*  MCV 107.4* 108.4* 105.5* 109.6*  PLT 439* 458* 473* 540*    Cardiac Enzymes: No results for input(s): CKTOTAL, CKMB, CKMBINDEX,  TROPONINI in the last 168 hours.  BNP (last 3 results) No results for input(s): BNP in the last 8760 hours.  ProBNP (last 3 results) No results for input(s): PROBNP in the last 8760 hours.  Radiological Exams: DG CHEST PORT 1 VIEW  Result Date: 07/20/2021 CLINICAL DATA:  70 year old female with shortness of breath. Query pleural effusions. EXAM: PORTABLE CHEST 1 VIEW COMPARISON:  Portable chest 07/13/2021. FINDINGS: Portable AP semi upright view at 0653 hours. Stable tracheostomy, somewhat eccentric to the left at the thoracic inlet. Stable right PICC line. Stable lung volumes and mediastinal contours. No cardiomegaly. No pneumothorax or pulmonary edema. Streaky and confluent bilateral infrahilar opacity resembles segmental lower lobe collapse or consolidation, more extensive on the left. Difficult to exclude small superimposed pleural effusions, but stable from earlier this month. No areas of worsening ventilation. No acute osseous abnormality identified. IMPRESSION: 1. Stable lines and tubes. 2. Bilateral lung base opacity most resembling segmental lower lobe collapse or consolidation, more extensive on the left. Small if any superimposed pleural effusions have not increased since 07/13/2021. Electronically Signed   By: Odessa Fleming M.D.   On: 07/20/2021 07:37    Assessment/Plan Active Problems:   Acute on chronic respiratory failure with hypoxia (HCC)   Severe sepsis with septic shock (HCC)   Aspiration  pneumonia of both lower lobes due to gastric secretions (HCC)   Ischemic bowel syndrome (HCC)   Tracheostomy status (HCC)   Acute on chronic respiratory failure with hypoxia plan is going to be to continue with the T-piece.  Secretions are minimal now so it is hopeful that he might be able to advance weaning.  However last chest x-ray showing basilar opacities possibility of lower lobe collapse Aspiration pneumonia patient remains at risk need to continue to monitor closely. Severe sepsis with  shock treated improved Ischemic bowel status post resection Tracheostomy will consider PMV and capping in the coming week   I have personally seen and evaluated the patient, evaluated laboratory and imaging results, formulated the assessment and plan and placed orders. The Patient requires high complexity decision making with multiple systems involvement.  Rounds were done with the Respiratory Therapy Director and Staff therapists and discussed with nursing staff also.  Yevonne Pax, MD [facility] Pulmonary Critical Care Medicine Sleep Medicine

## 2021-07-22 DIAGNOSIS — Z930 Tracheostomy status: Secondary | ICD-10-CM | POA: Diagnosis not present

## 2021-07-22 DIAGNOSIS — J690 Pneumonitis due to inhalation of food and vomit: Secondary | ICD-10-CM | POA: Diagnosis not present

## 2021-07-22 DIAGNOSIS — J9621 Acute and chronic respiratory failure with hypoxia: Secondary | ICD-10-CM | POA: Diagnosis not present

## 2021-07-22 DIAGNOSIS — R6521 Severe sepsis with septic shock: Secondary | ICD-10-CM | POA: Diagnosis not present

## 2021-07-22 DIAGNOSIS — A419 Sepsis, unspecified organism: Secondary | ICD-10-CM | POA: Diagnosis not present

## 2021-07-22 DIAGNOSIS — K559 Vascular disorder of intestine, unspecified: Secondary | ICD-10-CM | POA: Diagnosis not present

## 2021-07-22 DIAGNOSIS — G9340 Encephalopathy, unspecified: Secondary | ICD-10-CM | POA: Diagnosis not present

## 2021-07-22 DIAGNOSIS — J9600 Acute respiratory failure, unspecified whether with hypoxia or hypercapnia: Secondary | ICD-10-CM | POA: Diagnosis not present

## 2021-07-22 DIAGNOSIS — E119 Type 2 diabetes mellitus without complications: Secondary | ICD-10-CM | POA: Diagnosis not present

## 2021-07-22 DIAGNOSIS — N179 Acute kidney failure, unspecified: Secondary | ICD-10-CM | POA: Diagnosis not present

## 2021-07-22 LAB — BASIC METABOLIC PANEL
Anion gap: 7 (ref 5–15)
BUN: 17 mg/dL (ref 8–23)
CO2: 33 mmol/L — ABNORMAL HIGH (ref 22–32)
Calcium: 8.4 mg/dL — ABNORMAL LOW (ref 8.9–10.3)
Chloride: 102 mmol/L (ref 98–111)
Creatinine, Ser: 0.38 mg/dL — ABNORMAL LOW (ref 0.44–1.00)
GFR, Estimated: >60 mL/min (ref >60)
Glucose, Bld: 154 mg/dL — ABNORMAL HIGH (ref 70–99)
Potassium: 3.5 mmol/L (ref 3.5–5.1)
Sodium: 142 mmol/L (ref 135–145)

## 2021-07-22 LAB — CBC
HCT: 30.5 % — ABNORMAL LOW (ref 36.0–46.0)
Hemoglobin: 9.3 g/dL — ABNORMAL LOW (ref 12.0–15.0)
MCH: 32.7 pg (ref 26.0–34.0)
MCHC: 30.5 g/dL (ref 30.0–36.0)
MCV: 107.4 fL — ABNORMAL HIGH (ref 80.0–100.0)
Platelets: 454 10*3/uL — ABNORMAL HIGH (ref 150–400)
RBC: 2.84 MIL/uL — ABNORMAL LOW (ref 3.87–5.11)
RDW: 21.6 % — ABNORMAL HIGH (ref 11.5–15.5)
WBC: 24.7 10*3/uL — ABNORMAL HIGH (ref 4.0–10.5)
nRBC: 0.6 % — ABNORMAL HIGH (ref 0.0–0.2)

## 2021-07-22 LAB — MAGNESIUM: Magnesium: 1.5 mg/dL — ABNORMAL LOW (ref 1.7–2.4)

## 2021-07-22 NOTE — Progress Notes (Signed)
Pulmonary Critical Care Medicine [facility] [place]   PULMONARY CRITICAL CARE SERVICE  PROGRESS NOTE     Leslie Blevins  [id number]  DOB: 12-10-1950   DOA: 07/13/2021  Referring Physician: Carron Curie, MD  HPI: Leslie Blevins is a 70 y.o. female being followed for ventilator/airway/oxygen weaning Acute on Chronic Respiratory Failure.  Patient is afebrile right now resting comfortably without distress remains off ventilator on T collar secretions are improving  Medications: Reviewed on Rounds  Physical Exam:  Vitals: Temperature is 96.4 pulse 81 respiratory rate is 21 blood pressure is 137/73 saturations 95%  Ventilator Settings on T collar FiO2 28%  General: Comfortable at this time Neck: supple Cardiovascular: no malignant arrhythmias Respiratory: No rhonchi very coarse breath sound Skin: no rash seen on limited exam Musculoskeletal: No gross abnormality Psychiatric:unable to assess Neurologic:no involuntary movements         Lab Data:   Basic Metabolic Panel: Recent Labs  Lab 07/15/21 1952 07/16/21 0342 07/20/21 0730  NA  --   --  142  K 3.9 3.5 3.0*  CL  --   --  101  CO2  --   --  34*  GLUCOSE  --   --  164*  BUN  --   --  21  CREATININE  --   --  0.49  CALCIUM  --   --  8.4*  MG 1.9 1.8 1.6*    ABG: No results for input(s): PHART, PCO2ART, PO2ART, HCO3, O2SAT in the last 168 hours.  Liver Function Tests: No results for input(s): AST, ALT, ALKPHOS, BILITOT, PROT, ALBUMIN in the last 168 hours. No results for input(s): LIPASE, AMYLASE in the last 168 hours. No results for input(s): AMMONIA in the last 168 hours.  CBC: Recent Labs  Lab 07/16/21 0342 07/19/21 0802 07/20/21 0730 07/22/21 0807  WBC 15.4* 20.0* 25.2* 24.7*  HGB 9.4* 10.9* 9.6* 9.3*  HCT 30.9* 34.3* 32.1* 30.5*  MCV 108.4* 105.5* 109.6* 107.4*  PLT 458* 473* 540* 454*    Cardiac Enzymes: No results for input(s): CKTOTAL, CKMB, CKMBINDEX, TROPONINI in the last 168  hours.  BNP (last 3 results) No results for input(s): BNP in the last 8760 hours.  ProBNP (last 3 results) No results for input(s): PROBNP in the last 8760 hours.  Radiological Exams: No results found.  Assessment/Plan Active Problems:   Acute on chronic respiratory failure with hypoxia (HCC)   Severe sepsis with septic shock (HCC)   Aspiration pneumonia of both lower lobes due to gastric secretions (HCC)   Ischemic bowel syndrome (HCC)   Tracheostomy status (HCC)   Acute on chronic respiratory failure hypoxia we will continue with T collar patient is also been using the PMV we will continue to follow along closely. Severe sepsis with shock hemodynamics are stable at this time. Aspiration pneumonia has been treated we will continue to follow along closely. Ischemic bowel disease no change supportive care Tracheostomy remains in place   I have personally seen and evaluated the patient, evaluated laboratory and imaging results, formulated the assessment and plan and placed orders. The Patient requires high complexity decision making with multiple systems involvement.  Rounds were done with the Respiratory Therapy Director and Staff therapists and discussed with nursing staff also.  Yevonne Pax, MD [facility] Pulmonary Critical Care Medicine Sleep Medicine

## 2021-07-23 ENCOUNTER — Other Ambulatory Visit (HOSPITAL_COMMUNITY): Payer: PPO

## 2021-07-23 DIAGNOSIS — R6521 Severe sepsis with septic shock: Secondary | ICD-10-CM | POA: Diagnosis not present

## 2021-07-23 DIAGNOSIS — A419 Sepsis, unspecified organism: Secondary | ICD-10-CM | POA: Diagnosis not present

## 2021-07-23 DIAGNOSIS — E119 Type 2 diabetes mellitus without complications: Secondary | ICD-10-CM | POA: Diagnosis not present

## 2021-07-23 DIAGNOSIS — G9340 Encephalopathy, unspecified: Secondary | ICD-10-CM | POA: Diagnosis not present

## 2021-07-23 DIAGNOSIS — N179 Acute kidney failure, unspecified: Secondary | ICD-10-CM | POA: Diagnosis not present

## 2021-07-23 DIAGNOSIS — J9621 Acute and chronic respiratory failure with hypoxia: Secondary | ICD-10-CM | POA: Diagnosis not present

## 2021-07-23 DIAGNOSIS — J9600 Acute respiratory failure, unspecified whether with hypoxia or hypercapnia: Secondary | ICD-10-CM | POA: Diagnosis not present

## 2021-07-23 DIAGNOSIS — K559 Vascular disorder of intestine, unspecified: Secondary | ICD-10-CM | POA: Diagnosis not present

## 2021-07-23 DIAGNOSIS — J690 Pneumonitis due to inhalation of food and vomit: Secondary | ICD-10-CM | POA: Diagnosis not present

## 2021-07-23 DIAGNOSIS — D72829 Elevated white blood cell count, unspecified: Secondary | ICD-10-CM | POA: Diagnosis not present

## 2021-07-23 DIAGNOSIS — Z930 Tracheostomy status: Secondary | ICD-10-CM | POA: Diagnosis not present

## 2021-07-23 DIAGNOSIS — M47816 Spondylosis without myelopathy or radiculopathy, lumbar region: Secondary | ICD-10-CM | POA: Diagnosis not present

## 2021-07-23 LAB — MAGNESIUM: Magnesium: 1.9 mg/dL (ref 1.7–2.4)

## 2021-07-23 LAB — POTASSIUM: Potassium: 3.9 mmol/L (ref 3.5–5.1)

## 2021-07-23 NOTE — Progress Notes (Signed)
Pulmonary Critical Care Medicine [facility] [place]   PULMONARY CRITICAL CARE SERVICE  PROGRESS NOTE     Leslie Blevins  [id number]  DOB: 1951/11/20   DOA: 07/13/2021  Referring Physician: Carron Curie, MD  HPI: Leslie Blevins is a 70 y.o. female being followed for ventilator/airway/oxygen weaning Acute on Chronic Respiratory Failure.  Patient currently is on T collar secretions are minimal.  We will supposed to have done PMV yesterday  Medications: Reviewed on Rounds  Physical Exam:  Vitals: Temperature is 97.3 pulse 88 respiratory is 26 blood pressure is 125/71 saturations 99%  Ventilator Settings on T collar FiO2 28%  General: Comfortable at this time Neck: supple Cardiovascular: no malignant arrhythmias Respiratory: No rhonchi very coarse breath sounds Skin: no rash seen on limited exam Musculoskeletal: No gross abnormality Psychiatric:unable to assess Neurologic:no involuntary movements         Lab Data:   Basic Metabolic Panel: Recent Labs  Lab 07/20/21 0730 07/22/21 0807 07/23/21 0555  NA 142 142  --   K 3.0* 3.5 3.9  CL 101 102  --   CO2 34* 33*  --   GLUCOSE 164* 154*  --   BUN 21 17  --   CREATININE 0.49 0.38*  --   CALCIUM 8.4* 8.4*  --   MG 1.6* 1.5* 1.9    ABG: No results for input(s): PHART, PCO2ART, PO2ART, HCO3, O2SAT in the last 168 hours.  Liver Function Tests: No results for input(s): AST, ALT, ALKPHOS, BILITOT, PROT, ALBUMIN in the last 168 hours. No results for input(s): LIPASE, AMYLASE in the last 168 hours. No results for input(s): AMMONIA in the last 168 hours.  CBC: Recent Labs  Lab 07/19/21 0802 07/20/21 0730 07/22/21 0807  WBC 20.0* 25.2* 24.7*  HGB 10.9* 9.6* 9.3*  HCT 34.3* 32.1* 30.5*  MCV 105.5* 109.6* 107.4*  PLT 473* 540* 454*    Cardiac Enzymes: No results for input(s): CKTOTAL, CKMB, CKMBINDEX, TROPONINI in the last 168 hours.  BNP (last 3 results) No results for input(s): BNP in the last 8760  hours.  ProBNP (last 3 results) No results for input(s): PROBNP in the last 8760 hours.  Radiological Exams: CT CHEST ABDOMEN PELVIS WO CONTRAST  Result Date: 07/23/2021 CLINICAL DATA:  Leukocytosis EXAM: CT CHEST, ABDOMEN AND PELVIS WITHOUT CONTRAST TECHNIQUE: Multidetector CT imaging of the chest, abdomen and pelvis was performed following the standard protocol without IV contrast. COMPARISON:  Chest radiograph dated 07/20/2021. Abdominal radiograph dated 07/13/2021. FINDINGS: CT CHEST FINDINGS Cardiovascular: The heart is normal in size. No pericardial effusion. No evidence of thoracic aortic aneurysm. Atherosclerotic calcifications of the aortic arch. Coronary atherosclerosis of the LAD and left circumflex. Mediastinum/Nodes: No suspicious mediastinal lymphadenopathy. Visualized thyroid is unremarkable. Lungs/Pleura: Tracheostomy in satisfactory position. Mild biapical pleural-parenchymal scarring. Patchy bilateral lower lobe opacities, possibly reflecting atelectasis, although superimposed tree-in-bud opacities in the superior segment right lower lobe (series 4/image 81) raise the possibility of pneumonia. Faint ground-glass opacities in the bilateral upper lobes (for example, series 4/image 50) may reflect mild infection/aspiration. No pleural effusion or pneumothorax. Musculoskeletal: Visualized osseous structures are within normal limits. CT ABDOMEN PELVIS FINDINGS Hepatobiliary: 8 mm cyst inferiorly in the right hepatic lobe (series 3/image 35). Gallbladder is unremarkable. No intrahepatic or extrahepatic ductal dilatation. Pancreas: Within normal limits. Spleen: Within normal limits. Adrenals/Urinary Tract: Adrenal glands are within normal limits. Kidneys are within normal limits.  No hydronephrosis. Bladder is within normal limits. Stomach/Bowel: Stomach is notable for a percutaneous gastrostomy in satisfactory  position. No evidence of bowel obstruction. Status post right hemicolectomy with  appendectomy. Mild stranding in the mesenteric fat in the right mid abdomen (series 3/image 84), reflecting postsurgical change. No colonic wall thickening or inflammatory changes.  Rectal tube. Vascular/Lymphatic: No evidence of abdominal aortic aneurysm. Atherosclerotic calcifications of the abdominal aorta and branch vessels. No suspicious abdominopelvic lymphadenopathy. Reproductive: Status post hysterectomy. Bilateral ovaries are within normal limits. Other: No abdominopelvic ascites. Musculoskeletal: Old right 2nd and 3rd transverse process fractures (series 3/images 76 and 81), chronic. Mild degenerative changes of the lumbar spine. IMPRESSION: Patchy bilateral lower lobe opacities with faint ground-glass opacities in the upper lobes. While some of this appearance may reflect atelectasis, superimposed pneumonia is suspected. Tracheostomy and percutaneous gastrostomy in satisfactory position. Status post right hemicolectomy. Postsurgical change involving the mesenteric fat in the right mid abdomen. Additional ancillary findings as above. Electronically Signed   By: Charline Bills M.D.   On: 07/23/2021 02:20    Assessment/Plan Active Problems:   Acute on chronic respiratory failure with hypoxia (HCC)   Severe sepsis with septic shock (HCC)   Aspiration pneumonia of both lower lobes due to gastric secretions (HCC)   Ischemic bowel syndrome (HCC)   Tracheostomy status (HCC)   Acute on chronic respiratory failure hypoxia on T collar FiO2 28% try PMV again today Severe sepsis with shock resolved hemodynamics are stable at this time Aspiration pneumonia treated we will continue to follow along closely. Ischemic bowel no change supportive care Tracheostomy remains in place   I have personally seen and evaluated the patient, evaluated laboratory and imaging results, formulated the assessment and plan and placed orders. The Patient requires high complexity decision making with multiple systems  involvement.  Rounds were done with the Respiratory Therapy Director and Staff therapists and discussed with nursing staff also.  Yevonne Pax, MD [facility] Pulmonary Critical Care Medicine Sleep Medicine

## 2021-07-24 DIAGNOSIS — J690 Pneumonitis due to inhalation of food and vomit: Secondary | ICD-10-CM | POA: Diagnosis not present

## 2021-07-24 DIAGNOSIS — N179 Acute kidney failure, unspecified: Secondary | ICD-10-CM | POA: Diagnosis not present

## 2021-07-24 DIAGNOSIS — Z930 Tracheostomy status: Secondary | ICD-10-CM | POA: Diagnosis not present

## 2021-07-24 DIAGNOSIS — E119 Type 2 diabetes mellitus without complications: Secondary | ICD-10-CM | POA: Diagnosis not present

## 2021-07-24 DIAGNOSIS — G9340 Encephalopathy, unspecified: Secondary | ICD-10-CM | POA: Diagnosis not present

## 2021-07-24 DIAGNOSIS — J9600 Acute respiratory failure, unspecified whether with hypoxia or hypercapnia: Secondary | ICD-10-CM | POA: Diagnosis not present

## 2021-07-24 DIAGNOSIS — A419 Sepsis, unspecified organism: Secondary | ICD-10-CM | POA: Diagnosis not present

## 2021-07-24 DIAGNOSIS — K559 Vascular disorder of intestine, unspecified: Secondary | ICD-10-CM | POA: Diagnosis not present

## 2021-07-24 DIAGNOSIS — R6521 Severe sepsis with septic shock: Secondary | ICD-10-CM | POA: Diagnosis not present

## 2021-07-24 DIAGNOSIS — J9621 Acute and chronic respiratory failure with hypoxia: Secondary | ICD-10-CM | POA: Diagnosis not present

## 2021-07-24 LAB — CBC
HCT: 33.7 % — ABNORMAL LOW (ref 36.0–46.0)
Hemoglobin: 10.2 g/dL — ABNORMAL LOW (ref 12.0–15.0)
MCH: 32.6 pg (ref 26.0–34.0)
MCHC: 30.3 g/dL (ref 30.0–36.0)
MCV: 107.7 fL — ABNORMAL HIGH (ref 80.0–100.0)
Platelets: 409 10*3/uL — ABNORMAL HIGH (ref 150–400)
RBC: 3.13 MIL/uL — ABNORMAL LOW (ref 3.87–5.11)
RDW: 21 % — ABNORMAL HIGH (ref 11.5–15.5)
WBC: 25.7 10*3/uL — ABNORMAL HIGH (ref 4.0–10.5)
nRBC: 0.3 % — ABNORMAL HIGH (ref 0.0–0.2)

## 2021-07-24 NOTE — Progress Notes (Signed)
Pulmonary Critical Care Medicine [facility] [place]   PULMONARY CRITICAL CARE SERVICE  PROGRESS NOTE     Shamicka Inga  NWG:[id number]  DOB: 1951-01-01   DOA: 07/13/2021  Referring Physician: Carron Curie, MD  HPI: Leslie Blevins is a 70 y.o. female being followed for ventilator/airway/oxygen weaning Acute on Chronic Respiratory Failure.  Patient is resting comfortably right now without distress has been afebrile  Medications: Reviewed on Rounds  Physical Exam:  Vitals: Temperature is 97.6 pulse 86 respiratory to is 18 blood pressure is 123/66 saturations 96%  Ventilator Settings on T collar FiO2 is 28%  General: Comfortable at this time Neck: supple Cardiovascular: no malignant arrhythmias Respiratory: No rhonchi no rales are noted at this time Skin: no rash seen on limited exam Musculoskeletal: No gross abnormality Psychiatric:unable to assess Neurologic:no involuntary movements         Lab Data:   Basic Metabolic Panel: Recent Labs  Lab 07/20/21 0730 07/22/21 0807 07/23/21 0555  NA 142 142  --   K 3.0* 3.5 3.9  CL 101 102  --   CO2 34* 33*  --   GLUCOSE 164* 154*  --   BUN 21 17  --   CREATININE 0.49 0.38*  --   CALCIUM 8.4* 8.4*  --   MG 1.6* 1.5* 1.9    ABG: No results for input(s): PHART, PCO2ART, PO2ART, HCO3, O2SAT in the last 168 hours.  Liver Function Tests: No results for input(s): AST, ALT, ALKPHOS, BILITOT, PROT, ALBUMIN in the last 168 hours. No results for input(s): LIPASE, AMYLASE in the last 168 hours. No results for input(s): AMMONIA in the last 168 hours.  CBC: Recent Labs  Lab 07/19/21 0802 07/20/21 0730 07/22/21 0807  WBC 20.0* 25.2* 24.7*  HGB 10.9* 9.6* 9.3*  HCT 34.3* 32.1* 30.5*  MCV 105.5* 109.6* 107.4*  PLT 473* 540* 454*    Cardiac Enzymes: No results for input(s): CKTOTAL, CKMB, CKMBINDEX, TROPONINI in the last 168 hours.  BNP (last 3 results) No results for input(s): BNP in the last 8760 hours.  ProBNP  (last 3 results) No results for input(s): PROBNP in the last 8760 hours.  Radiological Exams: CT CHEST ABDOMEN PELVIS WO CONTRAST  Result Date: 07/23/2021 CLINICAL DATA:  Leukocytosis EXAM: CT CHEST, ABDOMEN AND PELVIS WITHOUT CONTRAST TECHNIQUE: Multidetector CT imaging of the chest, abdomen and pelvis was performed following the standard protocol without IV contrast. COMPARISON:  Chest radiograph dated 07/20/2021. Abdominal radiograph dated 07/13/2021. FINDINGS: CT CHEST FINDINGS Cardiovascular: The heart is normal in size. No pericardial effusion. No evidence of thoracic aortic aneurysm. Atherosclerotic calcifications of the aortic arch. Coronary atherosclerosis of the LAD and left circumflex. Mediastinum/Nodes: No suspicious mediastinal lymphadenopathy. Visualized thyroid is unremarkable. Lungs/Pleura: Tracheostomy in satisfactory position. Mild biapical pleural-parenchymal scarring. Patchy bilateral lower lobe opacities, possibly reflecting atelectasis, although superimposed tree-in-bud opacities in the superior segment right lower lobe (series 4/image 81) raise the possibility of pneumonia. Faint ground-glass opacities in the bilateral upper lobes (for example, series 4/image 50) may reflect mild infection/aspiration. No pleural effusion or pneumothorax. Musculoskeletal: Visualized osseous structures are within normal limits. CT ABDOMEN PELVIS FINDINGS Hepatobiliary: 8 mm cyst inferiorly in the right hepatic lobe (series 3/image 35). Gallbladder is unremarkable. No intrahepatic or extrahepatic ductal dilatation. Pancreas: Within normal limits. Spleen: Within normal limits. Adrenals/Urinary Tract: Adrenal glands are within normal limits. Kidneys are within normal limits.  No hydronephrosis. Bladder is within normal limits. Stomach/Bowel: Stomach is notable for a percutaneous gastrostomy in satisfactory position. No  evidence of bowel obstruction. Status post right hemicolectomy with appendectomy. Mild  stranding in the mesenteric fat in the right mid abdomen (series 3/image 84), reflecting postsurgical change. No colonic wall thickening or inflammatory changes.  Rectal tube. Vascular/Lymphatic: No evidence of abdominal aortic aneurysm. Atherosclerotic calcifications of the abdominal aorta and branch vessels. No suspicious abdominopelvic lymphadenopathy. Reproductive: Status post hysterectomy. Bilateral ovaries are within normal limits. Other: No abdominopelvic ascites. Musculoskeletal: Old right 2nd and 3rd transverse process fractures (series 3/images 76 and 81), chronic. Mild degenerative changes of the lumbar spine. IMPRESSION: Patchy bilateral lower lobe opacities with faint ground-glass opacities in the upper lobes. While some of this appearance may reflect atelectasis, superimposed pneumonia is suspected. Tracheostomy and percutaneous gastrostomy in satisfactory position. Status post right hemicolectomy. Postsurgical change involving the mesenteric fat in the right mid abdomen. Additional ancillary findings as above. Electronically Signed   By: Charline Bills M.D.   On: 07/23/2021 02:20    Assessment/Plan Active Problems:   Acute on chronic respiratory failure with hypoxia (HCC)   Severe sepsis with septic shock (HCC)   Aspiration pneumonia of both lower lobes due to gastric secretions (HCC)   Ischemic bowel syndrome (HCC)   Tracheostomy status (HCC)   Acute on chronic respiratory failure with hypoxia we will continue with T collar patient is on 28% FiO2 use PMV as tolerated Severe sepsis resolved hemodynamics are stable Aspiration pneumonia treated slow improvement patchy infiltrates noted on the chest films and CT scan.  Mainly located in the lower lobes Ischemic bowel status post hemicolectomy Tracheostomy remains in place   I have personally seen and evaluated the patient, evaluated laboratory and imaging results, formulated the assessment and plan and placed orders. The Patient  requires high complexity decision making with multiple systems involvement.  Rounds were done with the Respiratory Therapy Director and Staff therapists and discussed with nursing staff also.  Yevonne Pax, MD [facility] Pulmonary Critical Care Medicine Sleep Medicine

## 2021-07-25 DIAGNOSIS — Z930 Tracheostomy status: Secondary | ICD-10-CM | POA: Diagnosis not present

## 2021-07-25 DIAGNOSIS — K559 Vascular disorder of intestine, unspecified: Secondary | ICD-10-CM | POA: Diagnosis not present

## 2021-07-25 DIAGNOSIS — E119 Type 2 diabetes mellitus without complications: Secondary | ICD-10-CM | POA: Diagnosis not present

## 2021-07-25 DIAGNOSIS — J9621 Acute and chronic respiratory failure with hypoxia: Secondary | ICD-10-CM | POA: Diagnosis not present

## 2021-07-25 DIAGNOSIS — A419 Sepsis, unspecified organism: Secondary | ICD-10-CM | POA: Diagnosis not present

## 2021-07-25 DIAGNOSIS — R6521 Severe sepsis with septic shock: Secondary | ICD-10-CM | POA: Diagnosis not present

## 2021-07-25 DIAGNOSIS — G9340 Encephalopathy, unspecified: Secondary | ICD-10-CM | POA: Diagnosis not present

## 2021-07-25 DIAGNOSIS — J690 Pneumonitis due to inhalation of food and vomit: Secondary | ICD-10-CM | POA: Diagnosis not present

## 2021-07-25 DIAGNOSIS — N179 Acute kidney failure, unspecified: Secondary | ICD-10-CM | POA: Diagnosis not present

## 2021-07-25 DIAGNOSIS — J9600 Acute respiratory failure, unspecified whether with hypoxia or hypercapnia: Secondary | ICD-10-CM | POA: Diagnosis not present

## 2021-07-25 LAB — RENAL FUNCTION PANEL
Albumin: 1.6 g/dL — ABNORMAL LOW (ref 3.5–5.0)
Anion gap: 12 (ref 5–15)
BUN: 16 mg/dL (ref 8–23)
CO2: 36 mmol/L — ABNORMAL HIGH (ref 22–32)
Calcium: 9.1 mg/dL (ref 8.9–10.3)
Chloride: 98 mmol/L (ref 98–111)
Creatinine, Ser: 0.39 mg/dL — ABNORMAL LOW (ref 0.44–1.00)
GFR, Estimated: >60 mL/min (ref >60)
Glucose, Bld: 100 mg/dL — ABNORMAL HIGH (ref 70–99)
Phosphorus: 3.6 mg/dL (ref 2.5–4.6)
Potassium: 4 mmol/L (ref 3.5–5.1)
Sodium: 146 mmol/L — ABNORMAL HIGH (ref 135–145)

## 2021-07-25 LAB — CBC
HCT: 32.1 % — ABNORMAL LOW (ref 36.0–46.0)
Hemoglobin: 10 g/dL — ABNORMAL LOW (ref 12.0–15.0)
MCH: 33.1 pg (ref 26.0–34.0)
MCHC: 31.2 g/dL (ref 30.0–36.0)
MCV: 106.3 fL — ABNORMAL HIGH (ref 80.0–100.0)
Platelets: 405 10*3/uL — ABNORMAL HIGH (ref 150–400)
RBC: 3.02 MIL/uL — ABNORMAL LOW (ref 3.87–5.11)
RDW: 21 % — ABNORMAL HIGH (ref 11.5–15.5)
WBC: 26.4 10*3/uL — ABNORMAL HIGH (ref 4.0–10.5)
nRBC: 0.3 % — ABNORMAL HIGH (ref 0.0–0.2)

## 2021-07-25 LAB — MAGNESIUM: Magnesium: 1.6 mg/dL — ABNORMAL LOW (ref 1.7–2.4)

## 2021-07-25 NOTE — Progress Notes (Signed)
Pulmonary Critical Care Medicine [facility] [place]   PULMONARY CRITICAL CARE SERVICE  PROGRESS NOTE     Leslie Blevins  JDY:[id number]  DOB: Dec 13, 1950   DOA: 07/13/2021  Referring Physician: Carron Curie, MD  HPI: Leslie Blevins is a 70 y.o. female being followed for ventilator/airway/oxygen weaning Acute on Chronic Respiratory Failure.  Patient is comfortable right now without distress has been afebrile  Medications: Reviewed on Rounds  Physical Exam:  Vitals: Temperature is 97.3 pulse 84 respiratory rate is 21 blood pressure 118/61 saturations 98%  Ventilator Settings she is on T collar FiO2 35%  General: Comfortable at this time Neck: supple Cardiovascular: no malignant arrhythmias Respiratory: No rhonchi very coarse breath sound Skin: no rash seen on limited exam Musculoskeletal: No gross abnormality Psychiatric:unable to assess Neurologic:no involuntary movements         Lab Data:   Basic Metabolic Panel: Recent Labs  Lab 07/20/21 0730 07/22/21 0807 07/23/21 0555 07/25/21 0437  NA 142 142  --  146*  K 3.0* 3.5 3.9 4.0  CL 101 102  --  98  CO2 34* 33*  --  36*  GLUCOSE 164* 154*  --  100*  BUN 21 17  --  16  CREATININE 0.49 0.38*  --  0.39*  CALCIUM 8.4* 8.4*  --  9.1  MG 1.6* 1.5* 1.9 1.6*  PHOS  --   --   --  3.6    ABG: No results for input(s): PHART, PCO2ART, PO2ART, HCO3, O2SAT in the last 168 hours.  Liver Function Tests: Recent Labs  Lab 07/25/21 0437  ALBUMIN 1.6*   No results for input(s): LIPASE, AMYLASE in the last 168 hours. No results for input(s): AMMONIA in the last 168 hours.  CBC: Recent Labs  Lab 07/19/21 0802 07/20/21 0730 07/22/21 0807 07/24/21 2120 07/25/21 0437  WBC 20.0* 25.2* 24.7* 25.7* 26.4*  HGB 10.9* 9.6* 9.3* 10.2* 10.0*  HCT 34.3* 32.1* 30.5* 33.7* 32.1*  MCV 105.5* 109.6* 107.4* 107.7* 106.3*  PLT 473* 540* 454* 409* 405*    Cardiac Enzymes: No results for input(s): CKTOTAL, CKMB, CKMBINDEX,  TROPONINI in the last 168 hours.  BNP (last 3 results) No results for input(s): BNP in the last 8760 hours.  ProBNP (last 3 results) No results for input(s): PROBNP in the last 8760 hours.  Radiological Exams: No results found.  Assessment/Plan Active Problems:   Acute on chronic respiratory failure with hypoxia (HCC)   Severe sepsis with septic shock (HCC)   Aspiration pneumonia of both lower lobes due to gastric secretions (HCC)   Ischemic bowel syndrome (HCC)   Tracheostomy status (HCC)   Acute on chronic respiratory failure hypoxia patient continues on wean protocol try to start with capping trials discussed on multidisciplinary rounds Severe sepsis with shock hemodynamics are stable continue to follow along closely. Aspiration pneumonia treated slowly improving Ischemic bowel disease supportive care Tracheostomy remains in place at this time   I have personally seen and evaluated the patient, evaluated laboratory and imaging results, formulated the assessment and plan and placed orders. The Patient requires high complexity decision making with multiple systems involvement.  Rounds were done with the Respiratory Therapy Director and Staff therapists and discussed with nursing staff also.  Yevonne Pax, MD [facility] Pulmonary Critical Care Medicine Sleep Medicine

## 2021-07-26 DIAGNOSIS — G9340 Encephalopathy, unspecified: Secondary | ICD-10-CM | POA: Diagnosis not present

## 2021-07-26 DIAGNOSIS — J9621 Acute and chronic respiratory failure with hypoxia: Secondary | ICD-10-CM | POA: Diagnosis not present

## 2021-07-26 DIAGNOSIS — J9600 Acute respiratory failure, unspecified whether with hypoxia or hypercapnia: Secondary | ICD-10-CM | POA: Diagnosis not present

## 2021-07-26 DIAGNOSIS — A419 Sepsis, unspecified organism: Secondary | ICD-10-CM | POA: Diagnosis not present

## 2021-07-26 DIAGNOSIS — K559 Vascular disorder of intestine, unspecified: Secondary | ICD-10-CM | POA: Diagnosis not present

## 2021-07-26 DIAGNOSIS — E119 Type 2 diabetes mellitus without complications: Secondary | ICD-10-CM | POA: Diagnosis not present

## 2021-07-26 DIAGNOSIS — N179 Acute kidney failure, unspecified: Secondary | ICD-10-CM | POA: Diagnosis not present

## 2021-07-26 DIAGNOSIS — R6521 Severe sepsis with septic shock: Secondary | ICD-10-CM | POA: Diagnosis not present

## 2021-07-26 DIAGNOSIS — J690 Pneumonitis due to inhalation of food and vomit: Secondary | ICD-10-CM | POA: Diagnosis not present

## 2021-07-26 DIAGNOSIS — Z930 Tracheostomy status: Secondary | ICD-10-CM | POA: Diagnosis not present

## 2021-07-26 LAB — CBC
HCT: 32.6 % — ABNORMAL LOW (ref 36.0–46.0)
Hemoglobin: 10 g/dL — ABNORMAL LOW (ref 12.0–15.0)
MCH: 32.8 pg (ref 26.0–34.0)
MCHC: 30.7 g/dL (ref 30.0–36.0)
MCV: 106.9 fL — ABNORMAL HIGH (ref 80.0–100.0)
Platelets: 421 10*3/uL — ABNORMAL HIGH (ref 150–400)
RBC: 3.05 MIL/uL — ABNORMAL LOW (ref 3.87–5.11)
RDW: 20.1 % — ABNORMAL HIGH (ref 11.5–15.5)
WBC: 28.7 10*3/uL — ABNORMAL HIGH (ref 4.0–10.5)
nRBC: 0.2 % (ref 0.0–0.2)

## 2021-07-26 LAB — MAGNESIUM: Magnesium: 2 mg/dL (ref 1.7–2.4)

## 2021-07-26 LAB — BASIC METABOLIC PANEL
Anion gap: 9 (ref 5–15)
BUN: 22 mg/dL (ref 8–23)
CO2: 32 mmol/L (ref 22–32)
Calcium: 8.7 mg/dL — ABNORMAL LOW (ref 8.9–10.3)
Chloride: 103 mmol/L (ref 98–111)
Creatinine, Ser: 0.37 mg/dL — ABNORMAL LOW (ref 0.44–1.00)
GFR, Estimated: >60 mL/min (ref >60)
Glucose, Bld: 73 mg/dL (ref 70–99)
Potassium: 3.8 mmol/L (ref 3.5–5.1)
Sodium: 144 mmol/L (ref 135–145)

## 2021-07-26 LAB — URINALYSIS, ROUTINE W REFLEX MICROSCOPIC
Bilirubin Urine: NEGATIVE
Glucose, UA: NEGATIVE mg/dL
Hgb urine dipstick: NEGATIVE
Ketones, ur: NEGATIVE mg/dL
Nitrite: NEGATIVE
Protein, ur: NEGATIVE mg/dL
Specific Gravity, Urine: 1.016 (ref 1.005–1.030)
pH: 7 (ref 5.0–8.0)

## 2021-07-26 LAB — C DIFFICILE (CDIFF) QUICK SCRN (NO PCR REFLEX)
C Diff antigen: NEGATIVE
C Diff interpretation: NOT DETECTED
C Diff toxin: NEGATIVE

## 2021-07-26 LAB — CULTURE, BLOOD (ROUTINE X 2)
Culture: NO GROWTH
Culture: NO GROWTH

## 2021-07-26 LAB — PHOSPHORUS: Phosphorus: 3.5 mg/dL (ref 2.5–4.6)

## 2021-07-26 NOTE — Progress Notes (Signed)
Pulmonary Critical Care Medicine [facility] [place]   PULMONARY CRITICAL CARE SERVICE  PROGRESS NOTE     Leslie Blevins  ZOX:[id number]  DOB: 1951/06/24   DOA: 07/13/2021  Referring Physician: Carron Curie, MD  HPI: Leslie Blevins is a 71 y.o. female being followed for ventilator/airway/oxygen weaning Acute on Chronic Respiratory Failure.  Patient is comfortable right now without distress no fevers are noted  Medications: Reviewed on Rounds  Physical Exam:  Vitals: Temperature is 98.9 pulse 107 respiratory 24 blood pressure is 100/60 saturations 97%  Ventilator Settings currently is capping doing well patient is requiring some suctioning but is improving  General: Comfortable at this time Neck: supple Cardiovascular: no malignant arrhythmias Respiratory: Scattered rhonchi expansion is equal Skin: no rash seen on limited exam Musculoskeletal: No gross abnormality Psychiatric:unable to assess Neurologic:no involuntary movements         Lab Data:   Basic Metabolic Panel: Recent Labs  Lab 07/20/21 0730 07/22/21 0807 07/23/21 0555 07/25/21 0437 07/26/21 0526  NA 142 142  --  146* 144  K 3.0* 3.5 3.9 4.0 3.8  CL 101 102  --  98 103  CO2 34* 33*  --  36* 32  GLUCOSE 164* 154*  --  100* 73  BUN 21 17  --  16 22  CREATININE 0.49 0.38*  --  0.39* 0.37*  CALCIUM 8.4* 8.4*  --  9.1 8.7*  MG 1.6* 1.5* 1.9 1.6* 2.0  PHOS  --   --   --  3.6 3.5    ABG: No results for input(s): PHART, PCO2ART, PO2ART, HCO3, O2SAT in the last 168 hours.  Liver Function Tests: Recent Labs  Lab 07/25/21 0437  ALBUMIN 1.6*   No results for input(s): LIPASE, AMYLASE in the last 168 hours. No results for input(s): AMMONIA in the last 168 hours.  CBC: Recent Labs  Lab 07/20/21 0730 07/22/21 0807 07/24/21 2120 07/25/21 0437 07/26/21 0526  WBC 25.2* 24.7* 25.7* 26.4* 28.7*  HGB 9.6* 9.3* 10.2* 10.0* 10.0*  HCT 32.1* 30.5* 33.7* 32.1* 32.6*  MCV 109.6* 107.4* 107.7* 106.3*  106.9*  PLT 540* 454* 409* 405* 421*    Cardiac Enzymes: No results for input(s): CKTOTAL, CKMB, CKMBINDEX, TROPONINI in the last 168 hours.  BNP (last 3 results) No results for input(s): BNP in the last 8760 hours.  ProBNP (last 3 results) No results for input(s): PROBNP in the last 8760 hours.  Radiological Exams: No results found.  Assessment/Plan Active Problems:   Acute on chronic respiratory failure with hypoxia (HCC)   Severe sepsis with septic shock (HCC)   Aspiration pneumonia of both lower lobes due to gastric secretions (HCC)   Ischemic bowel syndrome (HCC)   Tracheostomy status (HCC)   Acute on chronic respiratory failure with hypoxia plan is to continue to advance the weaning and capping trials so far looks good 2 L of O2 Severe sepsis treated resolved hemodynamics stable Aspiration pneumonia has been treated we will continue to follow along closely. Ischemic bowel disease no change Tracheostomy remains in place   I have personally seen and evaluated the patient, evaluated laboratory and imaging results, formulated the assessment and plan and placed orders. The Patient requires high complexity decision making with multiple systems involvement.  Rounds were done with the Respiratory Therapy Director and Staff therapists and discussed with nursing staff also.  Yevonne Pax, MD [facility] Pulmonary Critical Care Medicine Sleep Medicine

## 2021-07-27 DIAGNOSIS — E039 Hypothyroidism, unspecified: Secondary | ICD-10-CM | POA: Diagnosis not present

## 2021-07-27 DIAGNOSIS — R6521 Severe sepsis with septic shock: Secondary | ICD-10-CM | POA: Diagnosis not present

## 2021-07-27 DIAGNOSIS — J9600 Acute respiratory failure, unspecified whether with hypoxia or hypercapnia: Secondary | ICD-10-CM | POA: Diagnosis not present

## 2021-07-27 DIAGNOSIS — A419 Sepsis, unspecified organism: Secondary | ICD-10-CM | POA: Diagnosis not present

## 2021-07-27 DIAGNOSIS — E119 Type 2 diabetes mellitus without complications: Secondary | ICD-10-CM | POA: Diagnosis not present

## 2021-07-27 DIAGNOSIS — J9621 Acute and chronic respiratory failure with hypoxia: Secondary | ICD-10-CM | POA: Diagnosis not present

## 2021-07-27 DIAGNOSIS — G9340 Encephalopathy, unspecified: Secondary | ICD-10-CM | POA: Diagnosis not present

## 2021-07-27 DIAGNOSIS — Z930 Tracheostomy status: Secondary | ICD-10-CM | POA: Diagnosis not present

## 2021-07-27 DIAGNOSIS — J690 Pneumonitis due to inhalation of food and vomit: Secondary | ICD-10-CM | POA: Diagnosis not present

## 2021-07-27 DIAGNOSIS — K559 Vascular disorder of intestine, unspecified: Secondary | ICD-10-CM | POA: Diagnosis not present

## 2021-07-27 LAB — URINE CULTURE
Culture: 20000 — AB
Special Requests: NORMAL

## 2021-07-27 NOTE — Progress Notes (Signed)
Pulmonary Critical Care Medicine [facility] [place]   PULMONARY CRITICAL CARE SERVICE  PROGRESS NOTE     Leslie Blevins  NOM:[id number]  DOB: 01-21-1951   DOA: 07/13/2021  Referring Physician: Luna Kitchens, MD  HPI: Leslie Blevins is a 70 y.o. female being followed for ventilator/airway/oxygen weaning Acute on Chronic Respiratory Failure.  Patient is comfortable right now without distress at this time she has been capping on 1 L of oxygen  Medications: Reviewed on Rounds  Physical Exam:  Vitals: Temperature is 97.5 pulse 86 respiratory rate 16 blood pressure is 122/69 saturations 97%  Ventilator Settings capping off the ventilator  General: Comfortable at this time Neck: supple Cardiovascular: no malignant arrhythmias Respiratory: No rhonchi very coarse breath sounds Skin: no rash seen on limited exam Musculoskeletal: No gross abnormality Psychiatric:unable to assess Neurologic:no involuntary movements         Lab Data:   Basic Metabolic Panel: Recent Labs  Lab 07/22/21 0807 07/23/21 0555 07/25/21 0437 07/26/21 0526  NA 142  --  146* 144  K 3.5 3.9 4.0 3.8  CL 102  --  98 103  CO2 33*  --  36* 32  GLUCOSE 154*  --  100* 73  BUN 17  --  16 22  CREATININE 0.38*  --  0.39* 0.37*  CALCIUM 8.4*  --  9.1 8.7*  MG 1.5* 1.9 1.6* 2.0  PHOS  --   --  3.6 3.5    ABG: No results for input(s): PHART, PCO2ART, PO2ART, HCO3, O2SAT in the last 168 hours.  Liver Function Tests: Recent Labs  Lab 07/25/21 0437  ALBUMIN 1.6*   No results for input(s): LIPASE, AMYLASE in the last 168 hours. No results for input(s): AMMONIA in the last 168 hours.  CBC: Recent Labs  Lab 07/22/21 0807 07/24/21 2120 07/25/21 0437 07/26/21 0526  WBC 24.7* 25.7* 26.4* 28.7*  HGB 9.3* 10.2* 10.0* 10.0*  HCT 30.5* 33.7* 32.1* 32.6*  MCV 107.4* 107.7* 106.3* 106.9*  PLT 454* 409* 405* 421*    Cardiac Enzymes: No results for input(s): CKTOTAL, CKMB, CKMBINDEX, TROPONINI in  the last 168 hours.  BNP (last 3 results) No results for input(s): BNP in the last 8760 hours.  ProBNP (last 3 results) No results for input(s): PROBNP in the last 8760 hours.  Radiological Exams: No results found.  Assessment/Plan Active Problems:   Acute on chronic respiratory failure with hypoxia (HCC)   Severe sepsis with septic shock (HCC)   Aspiration pneumonia of both lower lobes due to gastric secretions (HCC)   Ischemic bowel syndrome (HCC)   Tracheostomy status (HCC)   Acute on chronic respiratory failure hypoxia plan is to continue with capping trials patient does have a good strong cough she is doing well overall. Severe sepsis with shock hemodynamics are stable Aspiration pneumonia treated we will continue to follow along Tracheostomy remains in place Ischemic bowel disease no change   I have personally seen and evaluated the patient, evaluated laboratory and imaging results, formulated the assessment and plan and placed orders. The Patient requires high complexity decision making with multiple systems involvement.  Rounds were done with the Respiratory Therapy Director and Staff therapists and discussed with nursing staff also.  Leslie Pax, MD [facility] Pulmonary Critical Care Medicine Sleep Medicine

## 2021-07-28 DIAGNOSIS — J9600 Acute respiratory failure, unspecified whether with hypoxia or hypercapnia: Secondary | ICD-10-CM | POA: Diagnosis not present

## 2021-07-28 DIAGNOSIS — A419 Sepsis, unspecified organism: Secondary | ICD-10-CM | POA: Diagnosis not present

## 2021-07-28 DIAGNOSIS — G9341 Metabolic encephalopathy: Secondary | ICD-10-CM | POA: Diagnosis not present

## 2021-07-28 DIAGNOSIS — N179 Acute kidney failure, unspecified: Secondary | ICD-10-CM | POA: Diagnosis not present

## 2021-07-28 DIAGNOSIS — R6521 Severe sepsis with septic shock: Secondary | ICD-10-CM | POA: Diagnosis not present

## 2021-07-28 DIAGNOSIS — E119 Type 2 diabetes mellitus without complications: Secondary | ICD-10-CM | POA: Diagnosis not present

## 2021-07-28 DIAGNOSIS — G9340 Encephalopathy, unspecified: Secondary | ICD-10-CM | POA: Diagnosis not present

## 2021-07-28 DIAGNOSIS — J9621 Acute and chronic respiratory failure with hypoxia: Secondary | ICD-10-CM | POA: Diagnosis not present

## 2021-07-28 LAB — RENAL FUNCTION PANEL
Albumin: 1.7 g/dL — ABNORMAL LOW (ref 3.5–5.0)
Anion gap: 9 (ref 5–15)
BUN: 16 mg/dL (ref 8–23)
CO2: 35 mmol/L — ABNORMAL HIGH (ref 22–32)
Calcium: 8.9 mg/dL (ref 8.9–10.3)
Chloride: 102 mmol/L (ref 98–111)
Creatinine, Ser: 0.4 mg/dL — ABNORMAL LOW (ref 0.44–1.00)
GFR, Estimated: >60 mL/min (ref >60)
Glucose, Bld: 165 mg/dL — ABNORMAL HIGH (ref 70–99)
Phosphorus: 2.9 mg/dL (ref 2.5–4.6)
Potassium: 4 mmol/L (ref 3.5–5.1)
Sodium: 146 mmol/L — ABNORMAL HIGH (ref 135–145)

## 2021-07-28 LAB — CBC
HCT: 32.8 % — ABNORMAL LOW (ref 36.0–46.0)
Hemoglobin: 9.9 g/dL — ABNORMAL LOW (ref 12.0–15.0)
MCH: 32.7 pg (ref 26.0–34.0)
MCHC: 30.2 g/dL (ref 30.0–36.0)
MCV: 108.3 fL — ABNORMAL HIGH (ref 80.0–100.0)
Platelets: 494 10*3/uL — ABNORMAL HIGH (ref 150–400)
RBC: 3.03 MIL/uL — ABNORMAL LOW (ref 3.87–5.11)
RDW: 18.8 % — ABNORMAL HIGH (ref 11.5–15.5)
WBC: 24.2 10*3/uL — ABNORMAL HIGH (ref 4.0–10.5)
nRBC: 0.1 % (ref 0.0–0.2)

## 2021-07-28 LAB — MAGNESIUM: Magnesium: 1.7 mg/dL (ref 1.7–2.4)
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
## 2021-07-29 ENCOUNTER — Other Ambulatory Visit (HOSPITAL_COMMUNITY): Payer: PPO

## 2021-07-29 DIAGNOSIS — J9600 Acute respiratory failure, unspecified whether with hypoxia or hypercapnia: Secondary | ICD-10-CM | POA: Diagnosis not present

## 2021-07-29 DIAGNOSIS — K559 Vascular disorder of intestine, unspecified: Secondary | ICD-10-CM | POA: Diagnosis not present

## 2021-07-29 DIAGNOSIS — A419 Sepsis, unspecified organism: Secondary | ICD-10-CM | POA: Diagnosis not present

## 2021-07-29 DIAGNOSIS — Z930 Tracheostomy status: Secondary | ICD-10-CM | POA: Diagnosis not present

## 2021-07-29 DIAGNOSIS — J690 Pneumonitis due to inhalation of food and vomit: Secondary | ICD-10-CM | POA: Diagnosis not present

## 2021-07-29 DIAGNOSIS — E119 Type 2 diabetes mellitus without complications: Secondary | ICD-10-CM | POA: Diagnosis not present

## 2021-07-29 DIAGNOSIS — G9340 Encephalopathy, unspecified: Secondary | ICD-10-CM | POA: Diagnosis not present

## 2021-07-29 DIAGNOSIS — R6521 Severe sepsis with septic shock: Secondary | ICD-10-CM | POA: Diagnosis not present

## 2021-07-29 DIAGNOSIS — N179 Acute kidney failure, unspecified: Secondary | ICD-10-CM | POA: Diagnosis not present

## 2021-07-29 DIAGNOSIS — J9621 Acute and chronic respiratory failure with hypoxia: Secondary | ICD-10-CM | POA: Diagnosis not present

## 2021-07-29 LAB — BASIC METABOLIC PANEL
Anion gap: 8 (ref 5–15)
BUN: 14 mg/dL (ref 8–23)
CO2: 32 mmol/L (ref 22–32)
Calcium: 8.9 mg/dL (ref 8.9–10.3)
Chloride: 104 mmol/L (ref 98–111)
Creatinine, Ser: 0.4 mg/dL — ABNORMAL LOW (ref 0.44–1.00)
GFR, Estimated: >60 mL/min (ref >60)
Glucose, Bld: 209 mg/dL — ABNORMAL HIGH (ref 70–99)
Potassium: 4.2 mmol/L (ref 3.5–5.1)
Sodium: 144 mmol/L (ref 135–145)

## 2021-07-29 LAB — CBC
HCT: 34.1 % — ABNORMAL LOW (ref 36.0–46.0)
Hemoglobin: 10.2 g/dL — ABNORMAL LOW (ref 12.0–15.0)
MCH: 32.5 pg (ref 26.0–34.0)
MCHC: 29.9 g/dL — ABNORMAL LOW (ref 30.0–36.0)
MCV: 108.6 fL — ABNORMAL HIGH (ref 80.0–100.0)
Platelets: 467 10*3/uL — ABNORMAL HIGH (ref 150–400)
RBC: 3.14 MIL/uL — ABNORMAL LOW (ref 3.87–5.11)
RDW: 18.7 % — ABNORMAL HIGH (ref 11.5–15.5)
WBC: 20 10*3/uL — ABNORMAL HIGH (ref 4.0–10.5)
nRBC: 0.2 % (ref 0.0–0.2)

## 2021-07-29 LAB — CULTURE, RESPIRATORY W GRAM STAIN: Special Requests: NORMAL

## 2021-07-29 LAB — MAGNESIUM: Magnesium: 1.9 mg/dL (ref 1.7–2.4)

## 2021-07-29 LAB — PHOSPHORUS: Phosphorus: 3 mg/dL (ref 2.5–4.6)

## 2021-07-29 NOTE — Progress Notes (Signed)
Pulmonary Critical Care Medicine [facility] [place]   PULMONARY CRITICAL CARE SERVICE  PROGRESS NOTE     Leslie Blevins  ZOX:[id number]  DOB: March 20, 1951   DOA: 07/13/2021  Referring Physician: Luna Kitchens, MD  HPI: Leslie Blevins is a 70 y.o. female being followed for ventilator/airway/oxygen weaning Acute on Chronic Respiratory Failure.  Patient at this time is on T collar doing fairly well.  Overnight make attempts at capping her can she was capping over the weekend had some issues with secretions.  Medications: Reviewed on Rounds  Physical Exam:  Vitals: Temperature 97.9 pulse 88 respiratory is 19 blood pressure is 126/62 saturations 99%  Ventilator Settings on T collar  General: Comfortable at this time Neck: supple Cardiovascular: no malignant arrhythmias Respiratory: No rhonchi no rales are noted at this time Skin: no rash seen on limited exam Musculoskeletal: No gross abnormality Psychiatric:unable to assess Neurologic:no involuntary movements         Lab Data:   Basic Metabolic Panel: Recent Labs  Lab 07/23/21 0555 07/25/21 0437 07/26/21 0526 07/28/21 1141  NA  --  146* 144 146*  K 3.9 4.0 3.8 4.0  CL  --  98 103 102  CO2  --  36* 32 35*  GLUCOSE  --  100* 73 165*  BUN  --  16 22 16   CREATININE  --  0.39* 0.37* 0.40*  CALCIUM  --  9.1 8.7* 8.9  MG 1.9 1.6* 2.0 1.7  PHOS  --  3.6 3.5 2.9    ABG: No results for input(s): PHART, PCO2ART, PO2ART, HCO3, O2SAT in the last 168 hours.  Liver Function Tests: Recent Labs  Lab 07/25/21 0437 07/28/21 1141  ALBUMIN 1.6* 1.7*   No results for input(s): LIPASE, AMYLASE in the last 168 hours. No results for input(s): AMMONIA in the last 168 hours.  CBC: Recent Labs  Lab 07/24/21 2120 07/25/21 0437 07/26/21 0526 07/28/21 1141  WBC 25.7* 26.4* 28.7* 24.2*  HGB 10.2* 10.0* 10.0* 9.9*  HCT 33.7* 32.1* 32.6* 32.8*  MCV 107.7* 106.3* 106.9* 108.3*  PLT 409* 405* 421* 494*    Cardiac  Enzymes: No results for input(s): CKTOTAL, CKMB, CKMBINDEX, TROPONINI in the last 168 hours.  BNP (last 3 results) No results for input(s): BNP in the last 8760 hours.  ProBNP (last 3 results) No results for input(s): PROBNP in the last 8760 hours.  Radiological Exams: No results found.  Assessment/Plan Active Problems:   Acute on chronic respiratory failure with hypoxia (HCC)   Severe sepsis with septic shock (HCC)   Aspiration pneumonia of both lower lobes due to gastric secretions (HCC)   Ischemic bowel syndrome (HCC)   Tracheostomy status (HCC)   Acute on chronic respiratory failure hypoxia continue with T collar continue secretion management supportive care. Severe sepsis with shock resolved Aspiration pneumonia treated we will continue to follow along closely. Ischemic bowel no change supportive care Tracheostomy remains in place   I have personally seen and evaluated the patient, evaluated laboratory and imaging results, formulated the assessment and plan and placed orders. The Patient requires high complexity decision making with multiple systems involvement.  Rounds were done with the Respiratory Therapy Director and Staff therapists and discussed with nursing staff also.  07/30/21, MD [facility] Pulmonary Critical Care Medicine Sleep Medicine

## 2021-07-30 ENCOUNTER — Other Ambulatory Visit (HOSPITAL_COMMUNITY): Payer: PPO

## 2021-07-30 DIAGNOSIS — A419 Sepsis, unspecified organism: Secondary | ICD-10-CM | POA: Diagnosis not present

## 2021-07-30 DIAGNOSIS — J90 Pleural effusion, not elsewhere classified: Secondary | ICD-10-CM | POA: Diagnosis not present

## 2021-07-30 DIAGNOSIS — Z930 Tracheostomy status: Secondary | ICD-10-CM | POA: Diagnosis not present

## 2021-07-30 DIAGNOSIS — J690 Pneumonitis due to inhalation of food and vomit: Secondary | ICD-10-CM | POA: Diagnosis not present

## 2021-07-30 DIAGNOSIS — J9621 Acute and chronic respiratory failure with hypoxia: Secondary | ICD-10-CM | POA: Diagnosis not present

## 2021-07-30 DIAGNOSIS — R6521 Severe sepsis with septic shock: Secondary | ICD-10-CM | POA: Diagnosis not present

## 2021-07-30 DIAGNOSIS — J9600 Acute respiratory failure, unspecified whether with hypoxia or hypercapnia: Secondary | ICD-10-CM | POA: Diagnosis not present

## 2021-07-30 DIAGNOSIS — K559 Vascular disorder of intestine, unspecified: Secondary | ICD-10-CM | POA: Diagnosis not present

## 2021-07-30 DIAGNOSIS — N179 Acute kidney failure, unspecified: Secondary | ICD-10-CM | POA: Diagnosis not present

## 2021-07-30 DIAGNOSIS — E119 Type 2 diabetes mellitus without complications: Secondary | ICD-10-CM | POA: Diagnosis not present

## 2021-07-30 DIAGNOSIS — G9340 Encephalopathy, unspecified: Secondary | ICD-10-CM | POA: Diagnosis not present

## 2021-07-30 NOTE — Progress Notes (Signed)
Pulmonary Critical Care Medicine [facility] [place]   PULMONARY CRITICAL CARE SERVICE  PROGRESS NOTE     Leslie Blevins  CHE:[id number]  DOB: Nov 07, 1951   DOA: 07/13/2021  Referring Physician: Luna Kitchens, MD  HPI: Leslie Blevins is a 70 y.o. female being followed for ventilator/airway/oxygen weaning Acute on Chronic Respiratory Failure.  Patient is comfortable right now without distress at this time has been capping on room air  Medications: Reviewed on Rounds  Physical Exam:  Vitals: Temperature is 97.5 pulse 90 respiratory 20 blood pressure is 129/69 saturations 98%  Ventilator Settings capping room air  General: Comfortable at this time Neck: supple Cardiovascular: no malignant arrhythmias Respiratory: No rhonchi very coarse percent Skin: no rash seen on limited exam Musculoskeletal: No gross abnormality Psychiatric:unable to assess Neurologic:no involuntary movements         Lab Data:   Basic Metabolic Panel: Recent Labs  Lab 07/25/21 0437 07/26/21 0526 07/28/21 1141 07/29/21 1104  NA 146* 144 146* 144  K 4.0 3.8 4.0 4.2  CL 98 103 102 104  CO2 36* 32 35* 32  GLUCOSE 100* 73 165* 209*  BUN 16 22 16 14   CREATININE 0.39* 0.37* 0.40* 0.40*  CALCIUM 9.1 8.7* 8.9 8.9  MG 1.6* 2.0 1.7 1.9  PHOS 3.6 3.5 2.9 3.0    ABG: No results for input(s): PHART, PCO2ART, PO2ART, HCO3, O2SAT in the last 168 hours.  Liver Function Tests: Recent Labs  Lab 07/25/21 0437 07/28/21 1141  ALBUMIN 1.6* 1.7*   No results for input(s): LIPASE, AMYLASE in the last 168 hours. No results for input(s): AMMONIA in the last 168 hours.  CBC: Recent Labs  Lab 07/24/21 2120 07/25/21 0437 07/26/21 0526 07/28/21 1141 07/29/21 1104  WBC 25.7* 26.4* 28.7* 24.2* 20.0*  HGB 10.2* 10.0* 10.0* 9.9* 10.2*  HCT 33.7* 32.1* 32.6* 32.8* 34.1*  MCV 107.7* 106.3* 106.9* 108.3* 108.6*  PLT 409* 405* 421* 494* 467*    Cardiac Enzymes: No results for input(s): CKTOTAL, CKMB,  CKMBINDEX, TROPONINI in the last 168 hours.  BNP (last 3 results) No results for input(s): BNP in the last 8760 hours.  ProBNP (last 3 results) No results for input(s): PROBNP in the last 8760 hours.  Radiological Exams: DG Chest Port 1 View  Result Date: 07/30/2021 CLINICAL DATA:  70 year old female with history of pneumonia. EXAM: PORTABLE CHEST 1 VIEW COMPARISON:  Chest x-ray 07/20/2021. FINDINGS: A tracheostomy tube is in place with tip 4.8 cm above the carina. There is a right upper extremity PICC with tip terminating in the mid superior vena cava. Lung volumes are low. There are some ill-defined bibasilar opacities which may reflect areas of atelectasis and/or consolidation, overall slightly improved compared to the prior study. Small left pleural effusion. No definite right pleural effusion. No pneumothorax. No definite suspicious appearing pulmonary nodules or masses are noted. No evidence of pulmonary edema. Heart size is normal. Upper mediastinal contours are within normal limits. Atherosclerotic calcifications are noted in the thoracic aorta. IMPRESSION: 1. Support apparatus, as above. 2. Persistent areas of atelectasis and/or consolidation in the lung bases bilaterally, slightly improved compared to the prior study. 3. Persistent small left pleural effusion. 4. Aortic atherosclerosis. Electronically Signed   By: 07/22/2021 M.D.   On: 07/30/2021 06:01    Assessment/Plan Active Problems:   Acute on chronic respiratory failure with hypoxia (HCC)   Severe sepsis with septic shock (HCC)   Aspiration pneumonia of both lower lobes due to gastric secretions (HCC)  Ischemic bowel syndrome (HCC)   Tracheostomy status (HCC)   Acute on chronic respiratory failure hypoxia we should be able to change the patient's trach out to a cuffless trach.  Patient's husband was present in the room he was updated on the plan Severe sepsis resolved hemodynamics are stable Aspiration pneumonia treated  slowly improving we will continue to follow along Ischemic bowel disease no change continue with supportive care Tracheostomy remains in place we will change out to a cuffless trach   I have personally seen and evaluated the patient, evaluated laboratory and imaging results, formulated the assessment and plan and placed orders. The Patient requires high complexity decision making with multiple systems involvement.  Rounds were done with the Respiratory Therapy Director and Staff therapists and discussed with nursing staff also.  Yevonne Pax, MD [facility] Pulmonary Critical Care Medicine Sleep Medicine

## 2021-07-31 DIAGNOSIS — R6521 Severe sepsis with septic shock: Secondary | ICD-10-CM | POA: Diagnosis not present

## 2021-07-31 DIAGNOSIS — N179 Acute kidney failure, unspecified: Secondary | ICD-10-CM | POA: Diagnosis not present

## 2021-07-31 DIAGNOSIS — K559 Vascular disorder of intestine, unspecified: Secondary | ICD-10-CM | POA: Diagnosis not present

## 2021-07-31 DIAGNOSIS — E119 Type 2 diabetes mellitus without complications: Secondary | ICD-10-CM | POA: Diagnosis not present

## 2021-07-31 DIAGNOSIS — J690 Pneumonitis due to inhalation of food and vomit: Secondary | ICD-10-CM | POA: Diagnosis not present

## 2021-07-31 DIAGNOSIS — Z930 Tracheostomy status: Secondary | ICD-10-CM | POA: Diagnosis not present

## 2021-07-31 DIAGNOSIS — G9340 Encephalopathy, unspecified: Secondary | ICD-10-CM | POA: Diagnosis not present

## 2021-07-31 DIAGNOSIS — J9600 Acute respiratory failure, unspecified whether with hypoxia or hypercapnia: Secondary | ICD-10-CM | POA: Diagnosis not present

## 2021-07-31 DIAGNOSIS — J9621 Acute and chronic respiratory failure with hypoxia: Secondary | ICD-10-CM | POA: Diagnosis not present

## 2021-07-31 DIAGNOSIS — A419 Sepsis, unspecified organism: Secondary | ICD-10-CM | POA: Diagnosis not present

## 2021-07-31 NOTE — Progress Notes (Signed)
Pulmonary Critical Care Medicine [facility] [place]   PULMONARY CRITICAL CARE SERVICE  PROGRESS NOTE     Leslie Blevins  ZOX:[id number]  DOB: [birth year]   DOA: 07/13/2021  Referring Physician: Luna Kitchens, MD  HPI: Leslie Blevins is a 70 y.o. female being followed for ventilator/airway/oxygen weaning Acute on Chronic Respiratory Failure.  She is doing well has been capping on her own no fevers are noted requiring 2 L will be going for 72 hours on capping  Medications: Reviewed on Rounds  Physical Exam:  Vitals: Temperature is 98.8 pulse 83 respiratory 20 blood pressure is 138/71 saturations 96%  Ventilator Settings capping on 2 L of oxygen  General: Comfortable at this time Neck: supple Cardiovascular: no malignant arrhythmias Respiratory: No rhonchi no rales are noted at this time Skin: no rash seen on limited exam Musculoskeletal: No gross abnormality Psychiatric:unable to assess Neurologic:no involuntary movements         Lab Data:   Basic Metabolic Panel: Recent Labs  Lab 07/25/21 0437 07/26/21 0526 07/28/21 1141 07/29/21 1104  NA 146* 144 146* 144  K 4.0 3.8 4.0 4.2  CL 98 103 102 104  CO2 36* 32 35* 32  GLUCOSE 100* 73 165* 209*  BUN 16 22 16 14   CREATININE 0.39* 0.37* 0.40* 0.40*  CALCIUM 9.1 8.7* 8.9 8.9  MG 1.6* 2.0 1.7 1.9  PHOS 3.6 3.5 2.9 3.0    ABG: No results for input(s): PHART, PCO2ART, PO2ART, HCO3, O2SAT in the last 168 hours.  Liver Function Tests: Recent Labs  Lab 07/25/21 0437 07/28/21 1141  ALBUMIN 1.6* 1.7*   No results for input(s): LIPASE, AMYLASE in the last 168 hours. No results for input(s): AMMONIA in the last 168 hours.  CBC: Recent Labs  Lab 07/24/21 2120 07/25/21 0437 07/26/21 0526 07/28/21 1141 07/29/21 1104  WBC 25.7* 26.4* 28.7* 24.2* 20.0*  HGB 10.2* 10.0* 10.0* 9.9* 10.2*  HCT 33.7* 32.1* 32.6* 32.8* 34.1*  MCV 107.7* 106.3* 106.9* 108.3* 108.6*  PLT 409* 405* 421* 494* 467*    Cardiac  Enzymes: No results for input(s): CKTOTAL, CKMB, CKMBINDEX, TROPONINI in the last 168 hours.  BNP (last 3 results) No results for input(s): BNP in the last 8760 hours.  ProBNP (last 3 results) No results for input(s): PROBNP in the last 8760 hours.  Radiological Exams: DG Chest Port 1 View  Result Date: 07/30/2021 CLINICAL DATA:  70 year old female with history of pneumonia. EXAM: PORTABLE CHEST 1 VIEW COMPARISON:  Chest x-ray 07/20/2021. FINDINGS: A tracheostomy tube is in place with tip 4.8 cm above the carina. There is a right upper extremity PICC with tip terminating in the mid superior vena cava. Lung volumes are low. There are some ill-defined bibasilar opacities which may reflect areas of atelectasis and/or consolidation, overall slightly improved compared to the prior study. Small left pleural effusion. No definite right pleural effusion. No pneumothorax. No definite suspicious appearing pulmonary nodules or masses are noted. No evidence of pulmonary edema. Heart size is normal. Upper mediastinal contours are within normal limits. Atherosclerotic calcifications are noted in the thoracic aorta. IMPRESSION: 1. Support apparatus, as above. 2. Persistent areas of atelectasis and/or consolidation in the lung bases bilaterally, slightly improved compared to the prior study. 3. Persistent small left pleural effusion. 4. Aortic atherosclerosis. Electronically Signed   By: 07/22/2021 M.D.   On: 07/30/2021 06:01    Assessment/Plan Active Problems:   Acute on chronic respiratory failure with hypoxia (HCC)   Severe sepsis with septic  shock (HCC)   Aspiration pneumonia of both lower lobes due to gastric secretions (HCC)   Ischemic bowel syndrome (HCC)   Tracheostomy status (HCC)   Acute on chronic respiratory failure hypoxia plan is to continue with capping trials patient is on 2 L of O2 at this time Severe sepsis treated in resolution we will continue to follow along closely. Aspiration  pneumonia has been treated with antibiotics Ischemic bowel disease status post resection Tracheostomy doing well with capping and continue to wean   I have personally seen and evaluated the patient, evaluated laboratory and imaging results, formulated the assessment and plan and placed orders. The Patient requires high complexity decision making with multiple systems involvement.  Rounds were done with the Respiratory Therapy Director and Staff therapists and discussed with nursing staff also.  Yevonne Pax, MD [facility] Pulmonary Critical Care Medicine Sleep Medicine

## 2021-08-01 DIAGNOSIS — J9621 Acute and chronic respiratory failure with hypoxia: Secondary | ICD-10-CM | POA: Diagnosis not present

## 2021-08-01 DIAGNOSIS — Z930 Tracheostomy status: Secondary | ICD-10-CM | POA: Diagnosis not present

## 2021-08-01 DIAGNOSIS — J690 Pneumonitis due to inhalation of food and vomit: Secondary | ICD-10-CM | POA: Diagnosis not present

## 2021-08-01 DIAGNOSIS — A419 Sepsis, unspecified organism: Secondary | ICD-10-CM | POA: Diagnosis not present

## 2021-08-01 DIAGNOSIS — E039 Hypothyroidism, unspecified: Secondary | ICD-10-CM | POA: Diagnosis not present

## 2021-08-01 DIAGNOSIS — K559 Vascular disorder of intestine, unspecified: Secondary | ICD-10-CM | POA: Diagnosis not present

## 2021-08-01 DIAGNOSIS — R6521 Severe sepsis with septic shock: Secondary | ICD-10-CM | POA: Diagnosis not present

## 2021-08-01 DIAGNOSIS — G9340 Encephalopathy, unspecified: Secondary | ICD-10-CM | POA: Diagnosis not present

## 2021-08-01 DIAGNOSIS — E119 Type 2 diabetes mellitus without complications: Secondary | ICD-10-CM | POA: Diagnosis not present

## 2021-08-01 DIAGNOSIS — J9600 Acute respiratory failure, unspecified whether with hypoxia or hypercapnia: Secondary | ICD-10-CM | POA: Diagnosis not present

## 2021-08-01 LAB — CBC
HCT: 32.1 % — ABNORMAL LOW (ref 36.0–46.0)
Hemoglobin: 9.4 g/dL — ABNORMAL LOW (ref 12.0–15.0)
MCH: 32.1 pg (ref 26.0–34.0)
MCHC: 29.3 g/dL — ABNORMAL LOW (ref 30.0–36.0)
MCV: 109.6 fL — ABNORMAL HIGH (ref 80.0–100.0)
Platelets: 454 10*3/uL — ABNORMAL HIGH (ref 150–400)
RBC: 2.93 MIL/uL — ABNORMAL LOW (ref 3.87–5.11)
RDW: 17.1 % — ABNORMAL HIGH (ref 11.5–15.5)
WBC: 16.6 10*3/uL — ABNORMAL HIGH (ref 4.0–10.5)
nRBC: 0 % (ref 0.0–0.2)

## 2021-08-01 LAB — BASIC METABOLIC PANEL
Anion gap: 6 (ref 5–15)
BUN: 10 mg/dL (ref 8–23)
CO2: 33 mmol/L — ABNORMAL HIGH (ref 22–32)
Calcium: 8.8 mg/dL — ABNORMAL LOW (ref 8.9–10.3)
Chloride: 103 mmol/L (ref 98–111)
Creatinine, Ser: 0.43 mg/dL — ABNORMAL LOW (ref 0.44–1.00)
GFR, Estimated: >60 mL/min (ref >60)
Glucose, Bld: 109 mg/dL — ABNORMAL HIGH (ref 70–99)
Potassium: 4 mmol/L (ref 3.5–5.1)
Sodium: 142 mmol/L (ref 135–145)

## 2021-08-01 LAB — MAGNESIUM: Magnesium: 1.6 mg/dL — ABNORMAL LOW (ref 1.7–2.4)

## 2021-08-01 NOTE — Progress Notes (Signed)
Pulmonary Critical Care Medicine [facility] [place]   PULMONARY CRITICAL CARE SERVICE  PROGRESS NOTE     [name]  JQB:[id number]  DOB: 05-13-1951   DOA: 07/13/2021  Referring Physician: Luna Kitchens, MD  HPI: Leslie Blevins is a 70 y.o. female being followed for ventilator/airway/oxygen weaning Acute on Chronic Respiratory Failure.  Patient is afebrile right now resting comfortably without distress has been doing well with capping has been doing well with capping now for 4 days without any issues.  She has a good strong cough is able to clear secretions  Medications: Reviewed on Rounds  Physical Exam:  Vitals: Temperature is 97.5 pulse 78 respiratory 23 blood pressure is 105/61 saturations 97%  Ventilator Settings capping off the ventilator  General: Comfortable at this time Neck: supple Cardiovascular: no malignant arrhythmias Respiratory: No rhonchi no rales Skin: no rash seen on limited exam Musculoskeletal: No gross abnormality Psychiatric:unable to assess Neurologic:no involuntary movements         Lab Data:   Basic Metabolic Panel: Recent Labs  Lab 07/26/21 0526 07/28/21 1141 07/29/21 1104 08/01/21 0720  NA 144 146* 144 142  K 3.8 4.0 4.2 4.0  CL 103 102 104 103  CO2 32 35* 32 33*  GLUCOSE 73 165* 209* 109*  BUN 22 16 14 10   CREATININE 0.37* 0.40* 0.40* 0.43*  CALCIUM 8.7* 8.9 8.9 8.8*  MG 2.0 1.7 1.9 1.6*  PHOS 3.5 2.9 3.0  --     ABG: No results for input(s): PHART, PCO2ART, PO2ART, HCO3, O2SAT in the last 168 hours.  Liver Function Tests: Recent Labs  Lab 07/28/21 1141  ALBUMIN 1.7*   No results for input(s): LIPASE, AMYLASE in the last 168 hours. No results for input(s): AMMONIA in the last 168 hours.  CBC: Recent Labs  Lab 07/26/21 0526 07/28/21 1141 07/29/21 1104 08/01/21 0720  WBC 28.7* 24.2* 20.0* 16.6*  HGB 10.0* 9.9* 10.2* 9.4*  HCT 32.6* 32.8* 34.1* 32.1*  MCV 106.9* 108.3* 108.6* 109.6*  PLT 421* 494* 467*  454*    Cardiac Enzymes: No results for input(s): CKTOTAL, CKMB, CKMBINDEX, TROPONINI in the last 168 hours.  BNP (last 3 results) No results for input(s): BNP in the last 8760 hours.  ProBNP (last 3 results) No results for input(s): PROBNP in the last 8760 hours.  Radiological Exams: No results found.  Assessment/Plan Active Problems:   Acute on chronic respiratory failure with hypoxia (HCC)   Severe sepsis with septic shock (HCC)   Aspiration pneumonia of both lower lobes due to gastric secretions (HCC)   Ischemic bowel syndrome (HCC)   Tracheostomy status (HCC)   Acute on chronic respiratory failure with hypoxia we will proceed to decannulation Severe sepsis with shock treated resolved Aspiration pneumonia treated we will continue to follow along closely Ischemic bowel status post resection Tracheostomy remains in place   I have personally seen and evaluated the patient, evaluated laboratory and imaging results, formulated the assessment and plan and placed orders. The Patient requires high complexity decision making with multiple systems involvement.  Rounds were done with the Respiratory Therapy Director and Staff therapists and discussed with nursing staff also.  08/03/21, MD [facility] Pulmonary Critical Care Medicine Sleep Medicine

## 2021-08-02 DIAGNOSIS — J9600 Acute respiratory failure, unspecified whether with hypoxia or hypercapnia: Secondary | ICD-10-CM | POA: Diagnosis not present

## 2021-08-02 DIAGNOSIS — G9340 Encephalopathy, unspecified: Secondary | ICD-10-CM | POA: Diagnosis not present

## 2021-08-02 DIAGNOSIS — E039 Hypothyroidism, unspecified: Secondary | ICD-10-CM | POA: Diagnosis not present

## 2021-08-02 DIAGNOSIS — E119 Type 2 diabetes mellitus without complications: Secondary | ICD-10-CM | POA: Diagnosis not present

## 2021-08-02 LAB — MAGNESIUM: Magnesium: 1.9 mg/dL (ref 1.7–2.4)

## 2021-08-03 DIAGNOSIS — E119 Type 2 diabetes mellitus without complications: Secondary | ICD-10-CM | POA: Diagnosis not present

## 2021-08-03 DIAGNOSIS — G9340 Encephalopathy, unspecified: Secondary | ICD-10-CM | POA: Diagnosis not present

## 2021-08-03 DIAGNOSIS — N390 Urinary tract infection, site not specified: Secondary | ICD-10-CM | POA: Diagnosis not present

## 2021-08-03 DIAGNOSIS — J9600 Acute respiratory failure, unspecified whether with hypoxia or hypercapnia: Secondary | ICD-10-CM | POA: Diagnosis not present

## 2021-08-04 DIAGNOSIS — E119 Type 2 diabetes mellitus without complications: Secondary | ICD-10-CM | POA: Diagnosis not present

## 2021-08-04 DIAGNOSIS — J9600 Acute respiratory failure, unspecified whether with hypoxia or hypercapnia: Secondary | ICD-10-CM | POA: Diagnosis not present

## 2021-08-04 DIAGNOSIS — G9340 Encephalopathy, unspecified: Secondary | ICD-10-CM | POA: Diagnosis not present

## 2021-08-04 DIAGNOSIS — E039 Hypothyroidism, unspecified: Secondary | ICD-10-CM | POA: Diagnosis not present

## 2021-08-04 LAB — CBC
HCT: 33.7 % — ABNORMAL LOW (ref 36.0–46.0)
Hemoglobin: 10.2 g/dL — ABNORMAL LOW (ref 12.0–15.0)
MCH: 32.2 pg (ref 26.0–34.0)
MCHC: 30.3 g/dL (ref 30.0–36.0)
MCV: 106.3 fL — ABNORMAL HIGH (ref 80.0–100.0)
Platelets: 489 10*3/uL — ABNORMAL HIGH (ref 150–400)
RBC: 3.17 MIL/uL — ABNORMAL LOW (ref 3.87–5.11)
RDW: 16.3 % — ABNORMAL HIGH (ref 11.5–15.5)
WBC: 17.3 10*3/uL — ABNORMAL HIGH (ref 4.0–10.5)
nRBC: 0 % (ref 0.0–0.2)

## 2021-08-04 LAB — BLOOD GAS, ARTERIAL
Acid-Base Excess: 10.4 mmol/L — ABNORMAL HIGH (ref 0.0–2.0)
Bicarbonate: 35.2 mmol/L — ABNORMAL HIGH (ref 20.0–28.0)
FIO2: 44
O2 Saturation: 99.4 %
Patient temperature: 36.4
pCO2 arterial: 52.6 mmHg — ABNORMAL HIGH (ref 32.0–48.0)
pH, Arterial: 7.437 (ref 7.350–7.450)
pO2, Arterial: 117 mmHg — ABNORMAL HIGH (ref 83.0–108.0)

## 2021-08-04 LAB — BASIC METABOLIC PANEL
Anion gap: 9 (ref 5–15)
BUN: 8 mg/dL (ref 8–23)
CO2: 28 mmol/L (ref 22–32)
Calcium: 9.2 mg/dL (ref 8.9–10.3)
Chloride: 103 mmol/L (ref 98–111)
Creatinine, Ser: 0.33 mg/dL — ABNORMAL LOW (ref 0.44–1.00)
GFR, Estimated: >60 mL/min (ref >60)
Glucose, Bld: 154 mg/dL — ABNORMAL HIGH (ref 70–99)
Potassium: 4.2 mmol/L (ref 3.5–5.1)
Sodium: 140 mmol/L (ref 135–145)

## 2021-08-04 LAB — MAGNESIUM: Magnesium: 1.8 mg/dL (ref 1.7–2.4)

## 2021-08-05 ENCOUNTER — Other Ambulatory Visit (HOSPITAL_COMMUNITY): Payer: PPO

## 2021-08-05 DIAGNOSIS — E119 Type 2 diabetes mellitus without complications: Secondary | ICD-10-CM | POA: Diagnosis not present

## 2021-08-05 DIAGNOSIS — J9600 Acute respiratory failure, unspecified whether with hypoxia or hypercapnia: Secondary | ICD-10-CM | POA: Diagnosis not present

## 2021-08-05 DIAGNOSIS — G9340 Encephalopathy, unspecified: Secondary | ICD-10-CM | POA: Diagnosis not present

## 2021-08-05 DIAGNOSIS — J189 Pneumonia, unspecified organism: Secondary | ICD-10-CM | POA: Diagnosis not present

## 2021-08-05 DIAGNOSIS — N179 Acute kidney failure, unspecified: Secondary | ICD-10-CM | POA: Diagnosis not present

## 2021-08-05 DIAGNOSIS — J449 Chronic obstructive pulmonary disease, unspecified: Secondary | ICD-10-CM | POA: Diagnosis not present

## 2021-08-06 DIAGNOSIS — G9340 Encephalopathy, unspecified: Secondary | ICD-10-CM | POA: Diagnosis not present

## 2021-08-06 DIAGNOSIS — N179 Acute kidney failure, unspecified: Secondary | ICD-10-CM | POA: Diagnosis not present

## 2021-08-06 DIAGNOSIS — J9600 Acute respiratory failure, unspecified whether with hypoxia or hypercapnia: Secondary | ICD-10-CM | POA: Diagnosis not present

## 2021-08-06 DIAGNOSIS — E119 Type 2 diabetes mellitus without complications: Secondary | ICD-10-CM | POA: Diagnosis not present

## 2021-08-06 LAB — C DIFFICILE (CDIFF) QUICK SCRN (NO PCR REFLEX)
C Diff antigen: NEGATIVE
C Diff interpretation: NOT DETECTED
C Diff toxin: NEGATIVE

## 2021-08-06 LAB — URINALYSIS, ROUTINE W REFLEX MICROSCOPIC
Bilirubin Urine: NEGATIVE
Glucose, UA: NEGATIVE mg/dL
Hgb urine dipstick: NEGATIVE
Ketones, ur: NEGATIVE mg/dL
Nitrite: NEGATIVE
Protein, ur: NEGATIVE mg/dL
Specific Gravity, Urine: 1.028 (ref 1.005–1.030)
pH: 5 (ref 5.0–8.0)

## 2021-08-06 LAB — BASIC METABOLIC PANEL
Anion gap: 7 (ref 5–15)
BUN: 16 mg/dL (ref 8–23)
CO2: 32 mmol/L (ref 22–32)
Calcium: 9.3 mg/dL (ref 8.9–10.3)
Chloride: 102 mmol/L (ref 98–111)
Creatinine, Ser: 0.4 mg/dL — ABNORMAL LOW (ref 0.44–1.00)
GFR, Estimated: >60 mL/min (ref >60)
Glucose, Bld: 129 mg/dL — ABNORMAL HIGH (ref 70–99)
Potassium: 3.5 mmol/L (ref 3.5–5.1)
Sodium: 141 mmol/L (ref 135–145)

## 2021-08-06 LAB — CBC
HCT: 33.5 % — ABNORMAL LOW (ref 36.0–46.0)
Hemoglobin: 10.6 g/dL — ABNORMAL LOW (ref 12.0–15.0)
MCH: 32.6 pg (ref 26.0–34.0)
MCHC: 31.6 g/dL (ref 30.0–36.0)
MCV: 103.1 fL — ABNORMAL HIGH (ref 80.0–100.0)
Platelets: 513 10*3/uL — ABNORMAL HIGH (ref 150–400)
RBC: 3.25 MIL/uL — ABNORMAL LOW (ref 3.87–5.11)
RDW: 16 % — ABNORMAL HIGH (ref 11.5–15.5)
WBC: 17.6 10*3/uL — ABNORMAL HIGH (ref 4.0–10.5)
nRBC: 0 % (ref 0.0–0.2)

## 2021-08-06 LAB — MAGNESIUM: Magnesium: 1.8 mg/dL (ref 1.7–2.4)

## 2021-08-06 LAB — PHOSPHORUS: Phosphorus: 3.4 mg/dL (ref 2.5–4.6)

## 2021-08-07 ENCOUNTER — Other Ambulatory Visit (HOSPITAL_COMMUNITY): Payer: PPO

## 2021-08-07 DIAGNOSIS — J9690 Respiratory failure, unspecified, unspecified whether with hypoxia or hypercapnia: Secondary | ICD-10-CM | POA: Diagnosis not present

## 2021-08-07 DIAGNOSIS — G9340 Encephalopathy, unspecified: Secondary | ICD-10-CM | POA: Diagnosis not present

## 2021-08-07 DIAGNOSIS — N179 Acute kidney failure, unspecified: Secondary | ICD-10-CM | POA: Diagnosis not present

## 2021-08-07 DIAGNOSIS — E119 Type 2 diabetes mellitus without complications: Secondary | ICD-10-CM | POA: Diagnosis not present

## 2021-08-07 DIAGNOSIS — I639 Cerebral infarction, unspecified: Secondary | ICD-10-CM | POA: Diagnosis not present

## 2021-08-07 LAB — URINE CULTURE: Culture: NO GROWTH

## 2021-08-08 DIAGNOSIS — J9600 Acute respiratory failure, unspecified whether with hypoxia or hypercapnia: Secondary | ICD-10-CM | POA: Diagnosis not present

## 2021-08-08 DIAGNOSIS — G9340 Encephalopathy, unspecified: Secondary | ICD-10-CM | POA: Diagnosis not present

## 2021-08-08 DIAGNOSIS — E119 Type 2 diabetes mellitus without complications: Secondary | ICD-10-CM | POA: Diagnosis not present

## 2021-08-08 DIAGNOSIS — N179 Acute kidney failure, unspecified: Secondary | ICD-10-CM | POA: Diagnosis not present

## 2021-08-08 LAB — BASIC METABOLIC PANEL
Anion gap: 11 (ref 5–15)
BUN: 26 mg/dL — ABNORMAL HIGH (ref 8–23)
CO2: 32 mmol/L (ref 22–32)
Calcium: 10.1 mg/dL (ref 8.9–10.3)
Chloride: 99 mmol/L (ref 98–111)
Creatinine, Ser: 0.56 mg/dL (ref 0.44–1.00)
GFR, Estimated: >60 mL/min (ref >60)
Glucose, Bld: 163 mg/dL — ABNORMAL HIGH (ref 70–99)
Potassium: 4.3 mmol/L (ref 3.5–5.1)
Sodium: 142 mmol/L (ref 135–145)

## 2021-08-08 LAB — EXPECTORATED SPUTUM ASSESSMENT W GRAM STAIN, RFLX TO RESP C

## 2021-08-08 LAB — CBC
HCT: 42.6 % (ref 36.0–46.0)
Hemoglobin: 12.9 g/dL (ref 12.0–15.0)
MCH: 31.5 pg (ref 26.0–34.0)
MCHC: 30.3 g/dL (ref 30.0–36.0)
MCV: 103.9 fL — ABNORMAL HIGH (ref 80.0–100.0)
Platelets: 614 10*3/uL — ABNORMAL HIGH (ref 150–400)
RBC: 4.1 MIL/uL (ref 3.87–5.11)
RDW: 15.8 % — ABNORMAL HIGH (ref 11.5–15.5)
WBC: 25.4 10*3/uL — ABNORMAL HIGH (ref 4.0–10.5)
nRBC: 0 % (ref 0.0–0.2)

## 2021-08-08 LAB — MAGNESIUM: Magnesium: 1.7 mg/dL (ref 1.7–2.4)

## 2021-08-08 LAB — PHOSPHORUS: Phosphorus: 2.5 mg/dL (ref 2.5–4.6)

## 2021-08-09 DIAGNOSIS — N179 Acute kidney failure, unspecified: Secondary | ICD-10-CM | POA: Diagnosis not present

## 2021-08-09 DIAGNOSIS — E119 Type 2 diabetes mellitus without complications: Secondary | ICD-10-CM | POA: Diagnosis not present

## 2021-08-09 DIAGNOSIS — J9600 Acute respiratory failure, unspecified whether with hypoxia or hypercapnia: Secondary | ICD-10-CM | POA: Diagnosis not present

## 2021-08-09 DIAGNOSIS — G9340 Encephalopathy, unspecified: Secondary | ICD-10-CM | POA: Diagnosis not present

## 2021-08-09 LAB — MAGNESIUM: Magnesium: 2.1 mg/dL (ref 1.7–2.4)

## 2021-08-10 DIAGNOSIS — E119 Type 2 diabetes mellitus without complications: Secondary | ICD-10-CM | POA: Diagnosis not present

## 2021-08-10 DIAGNOSIS — N179 Acute kidney failure, unspecified: Secondary | ICD-10-CM | POA: Diagnosis not present

## 2021-08-10 DIAGNOSIS — J9600 Acute respiratory failure, unspecified whether with hypoxia or hypercapnia: Secondary | ICD-10-CM | POA: Diagnosis not present

## 2021-08-10 DIAGNOSIS — G9340 Encephalopathy, unspecified: Secondary | ICD-10-CM | POA: Diagnosis not present

## 2021-08-10 LAB — BASIC METABOLIC PANEL
Anion gap: 12 (ref 5–15)
BUN: 26 mg/dL — ABNORMAL HIGH (ref 8–23)
CO2: 30 mmol/L (ref 22–32)
Calcium: 9.4 mg/dL (ref 8.9–10.3)
Chloride: 99 mmol/L (ref 98–111)
Creatinine, Ser: 0.47 mg/dL (ref 0.44–1.00)
GFR, Estimated: >60 mL/min (ref >60)
Glucose, Bld: 154 mg/dL — ABNORMAL HIGH (ref 70–99)
Potassium: 3.6 mmol/L (ref 3.5–5.1)
Sodium: 141 mmol/L (ref 135–145)

## 2021-08-10 LAB — CBC
HCT: 37.9 % (ref 36.0–46.0)
Hemoglobin: 11.8 g/dL — ABNORMAL LOW (ref 12.0–15.0)
MCH: 31.9 pg (ref 26.0–34.0)
MCHC: 31.1 g/dL (ref 30.0–36.0)
MCV: 102.4 fL — ABNORMAL HIGH (ref 80.0–100.0)
Platelets: 446 10*3/uL — ABNORMAL HIGH (ref 150–400)
RBC: 3.7 MIL/uL — ABNORMAL LOW (ref 3.87–5.11)
RDW: 15.5 % (ref 11.5–15.5)
WBC: 22.1 10*3/uL — ABNORMAL HIGH (ref 4.0–10.5)
nRBC: 0.1 % (ref 0.0–0.2)

## 2021-08-10 LAB — CULTURE, RESPIRATORY W GRAM STAIN

## 2021-08-10 LAB — PHOSPHORUS: Phosphorus: 2.9 mg/dL (ref 2.5–4.6)

## 2021-08-10 LAB — MAGNESIUM: Magnesium: 1.7 mg/dL (ref 1.7–2.4)

## 2021-08-11 ENCOUNTER — Other Ambulatory Visit (HOSPITAL_COMMUNITY): Payer: PPO

## 2021-08-11 DIAGNOSIS — J9600 Acute respiratory failure, unspecified whether with hypoxia or hypercapnia: Secondary | ICD-10-CM | POA: Diagnosis not present

## 2021-08-11 DIAGNOSIS — J439 Emphysema, unspecified: Secondary | ICD-10-CM | POA: Diagnosis not present

## 2021-08-11 DIAGNOSIS — E119 Type 2 diabetes mellitus without complications: Secondary | ICD-10-CM | POA: Diagnosis not present

## 2021-08-11 DIAGNOSIS — N179 Acute kidney failure, unspecified: Secondary | ICD-10-CM | POA: Diagnosis not present

## 2021-08-11 DIAGNOSIS — G9340 Encephalopathy, unspecified: Secondary | ICD-10-CM | POA: Diagnosis not present

## 2021-08-11 LAB — MAGNESIUM: Magnesium: 1.8 mg/dL (ref 1.7–2.4)

## 2021-08-12 DIAGNOSIS — J9600 Acute respiratory failure, unspecified whether with hypoxia or hypercapnia: Secondary | ICD-10-CM | POA: Diagnosis not present

## 2021-08-12 DIAGNOSIS — E119 Type 2 diabetes mellitus without complications: Secondary | ICD-10-CM | POA: Diagnosis not present

## 2021-08-12 DIAGNOSIS — N179 Acute kidney failure, unspecified: Secondary | ICD-10-CM | POA: Diagnosis not present

## 2021-08-12 DIAGNOSIS — G9340 Encephalopathy, unspecified: Secondary | ICD-10-CM | POA: Diagnosis not present

## 2021-08-12 LAB — CBC
HCT: 38.5 % (ref 36.0–46.0)
Hemoglobin: 12.1 g/dL (ref 12.0–15.0)
MCH: 31.6 pg (ref 26.0–34.0)
MCHC: 31.4 g/dL (ref 30.0–36.0)
MCV: 100.5 fL — ABNORMAL HIGH (ref 80.0–100.0)
Platelets: 505 10*3/uL — ABNORMAL HIGH (ref 150–400)
RBC: 3.83 MIL/uL — ABNORMAL LOW (ref 3.87–5.11)
RDW: 15.6 % — ABNORMAL HIGH (ref 11.5–15.5)
WBC: 28.6 10*3/uL — ABNORMAL HIGH (ref 4.0–10.5)
nRBC: 0.1 % (ref 0.0–0.2)

## 2021-08-12 LAB — RENAL FUNCTION PANEL
Albumin: 1.8 g/dL — ABNORMAL LOW (ref 3.5–5.0)
Anion gap: 10 (ref 5–15)
BUN: 14 mg/dL (ref 8–23)
CO2: 33 mmol/L — ABNORMAL HIGH (ref 22–32)
Calcium: 9.3 mg/dL (ref 8.9–10.3)
Chloride: 98 mmol/L (ref 98–111)
Creatinine, Ser: <0.3 mg/dL — ABNORMAL LOW (ref 0.44–1.00)
Glucose, Bld: 178 mg/dL — ABNORMAL HIGH (ref 70–99)
Phosphorus: 3.3 mg/dL (ref 2.5–4.6)
Potassium: 3.3 mmol/L — ABNORMAL LOW (ref 3.5–5.1)
Sodium: 141 mmol/L (ref 135–145)

## 2021-08-12 LAB — VANCOMYCIN, TROUGH: Vancomycin Tr: 33 ug/mL (ref 15–20)

## 2021-08-12 LAB — MAGNESIUM: Magnesium: 1.5 mg/dL — ABNORMAL LOW (ref 1.7–2.4)

## 2021-08-13 DIAGNOSIS — J9600 Acute respiratory failure, unspecified whether with hypoxia or hypercapnia: Secondary | ICD-10-CM | POA: Diagnosis not present

## 2021-08-13 DIAGNOSIS — N179 Acute kidney failure, unspecified: Secondary | ICD-10-CM | POA: Diagnosis not present

## 2021-08-13 DIAGNOSIS — E119 Type 2 diabetes mellitus without complications: Secondary | ICD-10-CM | POA: Diagnosis not present

## 2021-08-13 DIAGNOSIS — G9340 Encephalopathy, unspecified: Secondary | ICD-10-CM | POA: Diagnosis not present

## 2021-08-13 LAB — MAGNESIUM: Magnesium: 1.7 mg/dL (ref 1.7–2.4)

## 2021-08-13 LAB — VANCOMYCIN, TROUGH: Vancomycin Tr: 9 ug/mL — ABNORMAL LOW (ref 15–20)

## 2021-08-13 LAB — POTASSIUM: Potassium: 3.5 mmol/L (ref 3.5–5.1)

## 2021-08-14 DIAGNOSIS — G9340 Encephalopathy, unspecified: Secondary | ICD-10-CM | POA: Diagnosis not present

## 2021-08-14 DIAGNOSIS — E119 Type 2 diabetes mellitus without complications: Secondary | ICD-10-CM | POA: Diagnosis not present

## 2021-08-14 DIAGNOSIS — J9600 Acute respiratory failure, unspecified whether with hypoxia or hypercapnia: Secondary | ICD-10-CM | POA: Diagnosis not present

## 2021-08-14 DIAGNOSIS — N179 Acute kidney failure, unspecified: Secondary | ICD-10-CM | POA: Diagnosis not present

## 2021-08-14 LAB — RENAL FUNCTION PANEL
Albumin: 1.8 g/dL — ABNORMAL LOW (ref 3.5–5.0)
Anion gap: 11 (ref 5–15)
BUN: 17 mg/dL (ref 8–23)
CO2: 30 mmol/L (ref 22–32)
Calcium: 9.1 mg/dL (ref 8.9–10.3)
Chloride: 99 mmol/L (ref 98–111)
Creatinine, Ser: 0.43 mg/dL — ABNORMAL LOW (ref 0.44–1.00)
GFR, Estimated: >60 mL/min (ref >60)
Glucose, Bld: 215 mg/dL — ABNORMAL HIGH (ref 70–99)
Phosphorus: 3.3 mg/dL (ref 2.5–4.6)
Potassium: 3.4 mmol/L — ABNORMAL LOW (ref 3.5–5.1)
Sodium: 140 mmol/L (ref 135–145)

## 2021-08-14 LAB — URINALYSIS, ROUTINE W REFLEX MICROSCOPIC
Bilirubin Urine: NEGATIVE
Glucose, UA: NEGATIVE mg/dL
Hgb urine dipstick: NEGATIVE
Ketones, ur: NEGATIVE mg/dL
Leukocytes,Ua: NEGATIVE
Nitrite: NEGATIVE
Protein, ur: NEGATIVE mg/dL
Specific Gravity, Urine: 1.016 (ref 1.005–1.030)
pH: 7 (ref 5.0–8.0)

## 2021-08-14 LAB — CBC
HCT: 36.6 % (ref 36.0–46.0)
Hemoglobin: 11.2 g/dL — ABNORMAL LOW (ref 12.0–15.0)
MCH: 31.5 pg (ref 26.0–34.0)
MCHC: 30.6 g/dL (ref 30.0–36.0)
MCV: 102.8 fL — ABNORMAL HIGH (ref 80.0–100.0)
Platelets: 442 10*3/uL — ABNORMAL HIGH (ref 150–400)
RBC: 3.56 MIL/uL — ABNORMAL LOW (ref 3.87–5.11)
RDW: 15.7 % — ABNORMAL HIGH (ref 11.5–15.5)
WBC: 28.5 10*3/uL — ABNORMAL HIGH (ref 4.0–10.5)
nRBC: 0.4 % — ABNORMAL HIGH (ref 0.0–0.2)

## 2021-08-14 LAB — POTASSIUM
Potassium: 3.4 mmol/L — ABNORMAL LOW (ref 3.5–5.1)
Potassium: 3.7 mmol/L (ref 3.5–5.1)

## 2021-08-14 LAB — MAGNESIUM: Magnesium: 1.8 mg/dL (ref 1.7–2.4)

## 2021-08-15 DIAGNOSIS — N179 Acute kidney failure, unspecified: Secondary | ICD-10-CM | POA: Diagnosis not present

## 2021-08-15 DIAGNOSIS — E119 Type 2 diabetes mellitus without complications: Secondary | ICD-10-CM | POA: Diagnosis not present

## 2021-08-15 DIAGNOSIS — G9340 Encephalopathy, unspecified: Secondary | ICD-10-CM | POA: Diagnosis not present

## 2021-08-15 DIAGNOSIS — J9600 Acute respiratory failure, unspecified whether with hypoxia or hypercapnia: Secondary | ICD-10-CM | POA: Diagnosis not present

## 2021-08-15 LAB — CBC WITH DIFFERENTIAL/PLATELET
Abs Immature Granulocytes: 1.4 10*3/uL — ABNORMAL HIGH (ref 0.00–0.07)
Basophils Absolute: 0 10*3/uL (ref 0.0–0.1)
Basophils Relative: 0 %
Blasts: 2 %
Eosinophils Absolute: 1.2 10*3/uL — ABNORMAL HIGH (ref 0.0–0.5)
Eosinophils Relative: 5 %
HCT: 35 % — ABNORMAL LOW (ref 36.0–46.0)
Hemoglobin: 10.9 g/dL — ABNORMAL LOW (ref 12.0–15.0)
Lymphocytes Relative: 6 %
Lymphs Abs: 1.4 10*3/uL (ref 0.7–4.0)
MCH: 31.5 pg (ref 26.0–34.0)
MCHC: 31.1 g/dL (ref 30.0–36.0)
MCV: 101.2 fL — ABNORMAL HIGH (ref 80.0–100.0)
Monocytes Absolute: 1.7 10*3/uL — ABNORMAL HIGH (ref 0.1–1.0)
Monocytes Relative: 7 %
Myelocytes: 3 %
Neutro Abs: 17.8 10*3/uL — ABNORMAL HIGH (ref 1.7–7.7)
Neutrophils Relative %: 74 %
Platelets: 414 10*3/uL — ABNORMAL HIGH (ref 150–400)
Promyelocytes Relative: 3 %
RBC: 3.46 MIL/uL — ABNORMAL LOW (ref 3.87–5.11)
RDW: 15.9 % — ABNORMAL HIGH (ref 11.5–15.5)
WBC: 24.1 10*3/uL — ABNORMAL HIGH (ref 4.0–10.5)
nRBC: 0 /100 WBC
nRBC: 0.2 % (ref 0.0–0.2)

## 2021-08-15 LAB — URINE CULTURE: Culture: NO GROWTH

## 2021-08-15 LAB — PROCALCITONIN: Procalcitonin: <0.1 ng/mL

## 2021-08-15 LAB — POTASSIUM: Potassium: 3.6 mmol/L (ref 3.5–5.1)

## 2021-08-15 LAB — SEDIMENTATION RATE: Sed Rate: 63 mm/hr — ABNORMAL HIGH (ref 0–22)

## 2021-08-16 ENCOUNTER — Other Ambulatory Visit (HOSPITAL_COMMUNITY): Payer: PPO

## 2021-08-16 DIAGNOSIS — J9600 Acute respiratory failure, unspecified whether with hypoxia or hypercapnia: Secondary | ICD-10-CM | POA: Diagnosis not present

## 2021-08-16 DIAGNOSIS — N179 Acute kidney failure, unspecified: Secondary | ICD-10-CM | POA: Diagnosis not present

## 2021-08-16 DIAGNOSIS — G9340 Encephalopathy, unspecified: Secondary | ICD-10-CM | POA: Diagnosis not present

## 2021-08-16 DIAGNOSIS — E119 Type 2 diabetes mellitus without complications: Secondary | ICD-10-CM | POA: Diagnosis not present

## 2021-08-16 LAB — BASIC METABOLIC PANEL
Anion gap: 7 (ref 5–15)
BUN: 9 mg/dL (ref 8–23)
CO2: 32 mmol/L (ref 22–32)
Calcium: 8.6 mg/dL — ABNORMAL LOW (ref 8.9–10.3)
Chloride: 100 mmol/L (ref 98–111)
Creatinine, Ser: 0.36 mg/dL — ABNORMAL LOW (ref 0.44–1.00)
GFR, Estimated: >60 mL/min (ref >60)
Glucose, Bld: 206 mg/dL — ABNORMAL HIGH (ref 70–99)
Potassium: 4.4 mmol/L (ref 3.5–5.1)
Sodium: 139 mmol/L (ref 135–145)

## 2021-08-16 LAB — VANCOMYCIN, TROUGH: Vancomycin Tr: 8 ug/mL — ABNORMAL LOW (ref 15–20)

## 2021-08-16 LAB — C-REACTIVE PROTEIN: CRP: 7.2 mg/dL — ABNORMAL HIGH (ref <1.0)

## 2021-08-16 LAB — PATHOLOGIST SMEAR REVIEW

## 2021-08-17 DIAGNOSIS — G9340 Encephalopathy, unspecified: Secondary | ICD-10-CM | POA: Diagnosis not present

## 2021-08-17 DIAGNOSIS — N179 Acute kidney failure, unspecified: Secondary | ICD-10-CM | POA: Diagnosis not present

## 2021-08-17 DIAGNOSIS — E119 Type 2 diabetes mellitus without complications: Secondary | ICD-10-CM | POA: Diagnosis not present

## 2021-08-17 DIAGNOSIS — J9600 Acute respiratory failure, unspecified whether with hypoxia or hypercapnia: Secondary | ICD-10-CM | POA: Diagnosis not present

## 2021-08-17 LAB — VANCOMYCIN, TROUGH: Vancomycin Tr: 16 ug/mL (ref 15–20)

## 2021-08-18 DIAGNOSIS — N179 Acute kidney failure, unspecified: Secondary | ICD-10-CM | POA: Diagnosis not present

## 2021-08-18 DIAGNOSIS — E119 Type 2 diabetes mellitus without complications: Secondary | ICD-10-CM | POA: Diagnosis not present

## 2021-08-18 DIAGNOSIS — G9340 Encephalopathy, unspecified: Secondary | ICD-10-CM | POA: Diagnosis not present

## 2021-08-18 DIAGNOSIS — J9600 Acute respiratory failure, unspecified whether with hypoxia or hypercapnia: Secondary | ICD-10-CM | POA: Diagnosis not present

## 2021-08-18 LAB — CBC
HCT: 35.5 % — ABNORMAL LOW (ref 36.0–46.0)
Hemoglobin: 11.2 g/dL — ABNORMAL LOW (ref 12.0–15.0)
MCH: 31.5 pg (ref 26.0–34.0)
MCHC: 31.5 g/dL (ref 30.0–36.0)
MCV: 100 fL (ref 80.0–100.0)
Platelets: 165 10*3/uL (ref 150–400)
RBC: 3.55 MIL/uL — ABNORMAL LOW (ref 3.87–5.11)
RDW: 15.9 % — ABNORMAL HIGH (ref 11.5–15.5)
WBC: 23.8 10*3/uL — ABNORMAL HIGH (ref 4.0–10.5)
nRBC: 0.2 % (ref 0.0–0.2)

## 2021-08-18 LAB — BASIC METABOLIC PANEL WITH GFR
Anion gap: 7 (ref 5–15)
BUN: 10 mg/dL (ref 8–23)
CO2: 29 mmol/L (ref 22–32)
Calcium: 8.7 mg/dL — ABNORMAL LOW (ref 8.9–10.3)
Chloride: 104 mmol/L (ref 98–111)
Creatinine, Ser: <0.3 mg/dL — ABNORMAL LOW (ref 0.44–1.00)
Glucose, Bld: 164 mg/dL — ABNORMAL HIGH (ref 70–99)
Potassium: 4.6 mmol/L (ref 3.5–5.1)
Sodium: 140 mmol/L (ref 135–145)

## 2021-08-18 LAB — MAGNESIUM: Magnesium: 1.5 mg/dL — ABNORMAL LOW (ref 1.7–2.4)

## 2021-08-19 DIAGNOSIS — N179 Acute kidney failure, unspecified: Secondary | ICD-10-CM | POA: Diagnosis not present

## 2021-08-19 DIAGNOSIS — J9600 Acute respiratory failure, unspecified whether with hypoxia or hypercapnia: Secondary | ICD-10-CM | POA: Diagnosis not present

## 2021-08-19 DIAGNOSIS — E119 Type 2 diabetes mellitus without complications: Secondary | ICD-10-CM | POA: Diagnosis not present

## 2021-08-19 DIAGNOSIS — G9340 Encephalopathy, unspecified: Secondary | ICD-10-CM | POA: Diagnosis not present

## 2021-08-19 LAB — CULTURE, BLOOD (ROUTINE X 2)
Culture: NO GROWTH
Culture: NO GROWTH
Special Requests: ADEQUATE
Special Requests: ADEQUATE

## 2021-08-19 LAB — MAGNESIUM: Magnesium: 1.8 mg/dL (ref 1.7–2.4)

## 2021-08-20 DIAGNOSIS — N179 Acute kidney failure, unspecified: Secondary | ICD-10-CM | POA: Diagnosis not present

## 2021-08-20 DIAGNOSIS — G9340 Encephalopathy, unspecified: Secondary | ICD-10-CM | POA: Diagnosis not present

## 2021-08-20 DIAGNOSIS — E119 Type 2 diabetes mellitus without complications: Secondary | ICD-10-CM | POA: Diagnosis not present

## 2021-08-20 DIAGNOSIS — J9600 Acute respiratory failure, unspecified whether with hypoxia or hypercapnia: Secondary | ICD-10-CM | POA: Diagnosis not present

## 2021-08-20 LAB — CBC
HCT: 35.1 % — ABNORMAL LOW (ref 36.0–46.0)
Hemoglobin: 10.9 g/dL — ABNORMAL LOW (ref 12.0–15.0)
MCH: 31.3 pg (ref 26.0–34.0)
MCHC: 31.1 g/dL (ref 30.0–36.0)
MCV: 100.9 fL — ABNORMAL HIGH (ref 80.0–100.0)
Platelets: 357 10*3/uL (ref 150–400)
RBC: 3.48 MIL/uL — ABNORMAL LOW (ref 3.87–5.11)
RDW: 16 % — ABNORMAL HIGH (ref 11.5–15.5)
WBC: 30.8 10*3/uL — ABNORMAL HIGH (ref 4.0–10.5)
nRBC: 0 % (ref 0.0–0.2)

## 2021-08-20 LAB — RENAL FUNCTION PANEL
Albumin: 1.9 g/dL — ABNORMAL LOW (ref 3.5–5.0)
Anion gap: 11 (ref 5–15)
BUN: 8 mg/dL (ref 8–23)
CO2: 27 mmol/L (ref 22–32)
Calcium: 8.8 mg/dL — ABNORMAL LOW (ref 8.9–10.3)
Chloride: 100 mmol/L (ref 98–111)
Creatinine, Ser: <0.3 mg/dL — ABNORMAL LOW (ref 0.44–1.00)
Glucose, Bld: 170 mg/dL — ABNORMAL HIGH (ref 70–99)
Phosphorus: 3.8 mg/dL (ref 2.5–4.6)
Potassium: 4.3 mmol/L (ref 3.5–5.1)
Sodium: 138 mmol/L (ref 135–145)

## 2021-08-20 LAB — C-REACTIVE PROTEIN: CRP: 5.1 mg/dL — ABNORMAL HIGH (ref <1.0)

## 2021-08-20 LAB — MAGNESIUM: Magnesium: 1.6 mg/dL — ABNORMAL LOW (ref 1.7–2.4)

## 2021-08-20 LAB — SEDIMENTATION RATE: Sed Rate: 59 mm/hr — ABNORMAL HIGH (ref 0–22)

## 2021-08-21 ENCOUNTER — Other Ambulatory Visit (HOSPITAL_COMMUNITY): Payer: PPO

## 2021-08-21 DIAGNOSIS — N281 Cyst of kidney, acquired: Secondary | ICD-10-CM | POA: Diagnosis not present

## 2021-08-21 DIAGNOSIS — G9340 Encephalopathy, unspecified: Secondary | ICD-10-CM | POA: Diagnosis not present

## 2021-08-21 DIAGNOSIS — I2510 Atherosclerotic heart disease of native coronary artery without angina pectoris: Secondary | ICD-10-CM | POA: Diagnosis not present

## 2021-08-21 DIAGNOSIS — J984 Other disorders of lung: Secondary | ICD-10-CM | POA: Diagnosis not present

## 2021-08-21 DIAGNOSIS — N179 Acute kidney failure, unspecified: Secondary | ICD-10-CM | POA: Diagnosis not present

## 2021-08-21 DIAGNOSIS — A419 Sepsis, unspecified organism: Secondary | ICD-10-CM | POA: Diagnosis not present

## 2021-08-21 DIAGNOSIS — K820 Obstruction of gallbladder: Secondary | ICD-10-CM | POA: Diagnosis not present

## 2021-08-21 DIAGNOSIS — J9600 Acute respiratory failure, unspecified whether with hypoxia or hypercapnia: Secondary | ICD-10-CM | POA: Diagnosis not present

## 2021-08-21 DIAGNOSIS — E119 Type 2 diabetes mellitus without complications: Secondary | ICD-10-CM | POA: Diagnosis not present

## 2021-08-21 DIAGNOSIS — D72829 Elevated white blood cell count, unspecified: Secondary | ICD-10-CM | POA: Diagnosis not present

## 2021-08-21 LAB — CBC
HCT: 33.8 % — ABNORMAL LOW (ref 36.0–46.0)
Hemoglobin: 10.3 g/dL — ABNORMAL LOW (ref 12.0–15.0)
MCH: 31.2 pg (ref 26.0–34.0)
MCHC: 30.5 g/dL (ref 30.0–36.0)
MCV: 102.4 fL — ABNORMAL HIGH (ref 80.0–100.0)
Platelets: 330 10*3/uL (ref 150–400)
RBC: 3.3 MIL/uL — ABNORMAL LOW (ref 3.87–5.11)
RDW: 16.2 % — ABNORMAL HIGH (ref 11.5–15.5)
WBC: 26 10*3/uL — ABNORMAL HIGH (ref 4.0–10.5)
nRBC: 0 % (ref 0.0–0.2)

## 2021-08-21 LAB — RENAL FUNCTION PANEL
Albumin: 1.8 g/dL — ABNORMAL LOW (ref 3.5–5.0)
Anion gap: 10 (ref 5–15)
BUN: 6 mg/dL — ABNORMAL LOW (ref 8–23)
CO2: 28 mmol/L (ref 22–32)
Calcium: 8.9 mg/dL (ref 8.9–10.3)
Chloride: 103 mmol/L (ref 98–111)
Creatinine, Ser: <0.3 mg/dL — ABNORMAL LOW (ref 0.44–1.00)
Glucose, Bld: 176 mg/dL — ABNORMAL HIGH (ref 70–99)
Phosphorus: 3.3 mg/dL (ref 2.5–4.6)
Potassium: 4 mmol/L (ref 3.5–5.1)
Sodium: 141 mmol/L (ref 135–145)

## 2021-08-21 LAB — SARS CORONAVIRUS 2 (TAT 6-24 HRS): SARS Coronavirus 2: NEGATIVE

## 2021-08-21 LAB — MAGNESIUM: Magnesium: 1.7 mg/dL (ref 1.7–2.4)

## 2021-08-21 MED ORDER — IOHEXOL 350 MG/ML SOLN
65.0000 mL | Freq: Once | INTRAVENOUS | Status: AC | PRN
  Administered 2021-08-21: 65 mL via INTRAVENOUS

## 2021-08-22 DIAGNOSIS — E119 Type 2 diabetes mellitus without complications: Secondary | ICD-10-CM | POA: Diagnosis not present

## 2021-08-22 DIAGNOSIS — J9600 Acute respiratory failure, unspecified whether with hypoxia or hypercapnia: Secondary | ICD-10-CM | POA: Diagnosis not present

## 2021-08-22 DIAGNOSIS — N179 Acute kidney failure, unspecified: Secondary | ICD-10-CM | POA: Diagnosis not present

## 2021-08-22 DIAGNOSIS — G9340 Encephalopathy, unspecified: Secondary | ICD-10-CM | POA: Diagnosis not present

## 2021-08-22 LAB — MAGNESIUM: Magnesium: 1.4 mg/dL — ABNORMAL LOW (ref 1.7–2.4)

## 2021-08-22 LAB — CBC
HCT: 30.6 % — ABNORMAL LOW (ref 36.0–46.0)
Hemoglobin: 9.4 g/dL — ABNORMAL LOW (ref 12.0–15.0)
MCH: 31 pg (ref 26.0–34.0)
MCHC: 30.7 g/dL (ref 30.0–36.0)
MCV: 101 fL — ABNORMAL HIGH (ref 80.0–100.0)
Platelets: 308 10*3/uL (ref 150–400)
RBC: 3.03 MIL/uL — ABNORMAL LOW (ref 3.87–5.11)
RDW: 15.9 % — ABNORMAL HIGH (ref 11.5–15.5)
WBC: 21.1 10*3/uL — ABNORMAL HIGH (ref 4.0–10.5)
nRBC: 0 % (ref 0.0–0.2)

## 2021-08-23 DIAGNOSIS — J9600 Acute respiratory failure, unspecified whether with hypoxia or hypercapnia: Secondary | ICD-10-CM | POA: Diagnosis not present

## 2021-08-23 DIAGNOSIS — G9340 Encephalopathy, unspecified: Secondary | ICD-10-CM | POA: Diagnosis not present

## 2021-08-23 DIAGNOSIS — E119 Type 2 diabetes mellitus without complications: Secondary | ICD-10-CM | POA: Diagnosis not present

## 2021-08-23 DIAGNOSIS — N179 Acute kidney failure, unspecified: Secondary | ICD-10-CM | POA: Diagnosis not present

## 2021-08-23 LAB — CBC
HCT: 32.1 % — ABNORMAL LOW (ref 36.0–46.0)
Hemoglobin: 10.1 g/dL — ABNORMAL LOW (ref 12.0–15.0)
MCH: 31.3 pg (ref 26.0–34.0)
MCHC: 31.5 g/dL (ref 30.0–36.0)
MCV: 99.4 fL (ref 80.0–100.0)
Platelets: 363 10*3/uL (ref 150–400)
RBC: 3.23 MIL/uL — ABNORMAL LOW (ref 3.87–5.11)
RDW: 15.6 % — ABNORMAL HIGH (ref 11.5–15.5)
WBC: 20.4 10*3/uL — ABNORMAL HIGH (ref 4.0–10.5)
nRBC: 0 % (ref 0.0–0.2)

## 2021-08-23 LAB — RENAL FUNCTION PANEL
Albumin: 1.8 g/dL — ABNORMAL LOW (ref 3.5–5.0)
Anion gap: 13 (ref 5–15)
BUN: 9 mg/dL (ref 8–23)
CO2: 30 mmol/L (ref 22–32)
Calcium: 9.1 mg/dL (ref 8.9–10.3)
Chloride: 98 mmol/L (ref 98–111)
Creatinine, Ser: <0.3 mg/dL — ABNORMAL LOW (ref 0.44–1.00)
Glucose, Bld: 120 mg/dL — ABNORMAL HIGH (ref 70–99)
Phosphorus: 3.1 mg/dL (ref 2.5–4.6)
Potassium: 3.8 mmol/L (ref 3.5–5.1)
Sodium: 141 mmol/L (ref 135–145)

## 2021-08-23 LAB — MAGNESIUM: Magnesium: 2.1 mg/dL (ref 1.7–2.4)

## 2021-08-24 DIAGNOSIS — J9600 Acute respiratory failure, unspecified whether with hypoxia or hypercapnia: Secondary | ICD-10-CM | POA: Diagnosis not present

## 2021-08-24 DIAGNOSIS — G9340 Encephalopathy, unspecified: Secondary | ICD-10-CM | POA: Diagnosis not present

## 2021-08-24 DIAGNOSIS — N179 Acute kidney failure, unspecified: Secondary | ICD-10-CM | POA: Diagnosis not present

## 2021-08-24 DIAGNOSIS — E119 Type 2 diabetes mellitus without complications: Secondary | ICD-10-CM | POA: Diagnosis not present

## 2021-08-25 DIAGNOSIS — J9600 Acute respiratory failure, unspecified whether with hypoxia or hypercapnia: Secondary | ICD-10-CM | POA: Diagnosis not present

## 2021-08-25 DIAGNOSIS — E119 Type 2 diabetes mellitus without complications: Secondary | ICD-10-CM | POA: Diagnosis not present

## 2021-08-25 DIAGNOSIS — G9340 Encephalopathy, unspecified: Secondary | ICD-10-CM | POA: Diagnosis not present

## 2021-08-25 DIAGNOSIS — N179 Acute kidney failure, unspecified: Secondary | ICD-10-CM | POA: Diagnosis not present

## 2021-08-25 LAB — GASTROINTESTINAL PANEL BY PCR, STOOL (REPLACES STOOL CULTURE)

## 2021-08-26 DIAGNOSIS — J9600 Acute respiratory failure, unspecified whether with hypoxia or hypercapnia: Secondary | ICD-10-CM | POA: Diagnosis not present

## 2021-08-26 DIAGNOSIS — N179 Acute kidney failure, unspecified: Secondary | ICD-10-CM | POA: Diagnosis not present

## 2021-08-26 DIAGNOSIS — G9340 Encephalopathy, unspecified: Secondary | ICD-10-CM | POA: Diagnosis not present

## 2021-08-26 DIAGNOSIS — E119 Type 2 diabetes mellitus without complications: Secondary | ICD-10-CM | POA: Diagnosis not present

## 2021-08-26 LAB — BASIC METABOLIC PANEL
Anion gap: 15 (ref 5–15)
BUN: 10 mg/dL (ref 8–23)
CO2: 29 mmol/L (ref 22–32)
Calcium: 9.3 mg/dL (ref 8.9–10.3)
Chloride: 94 mmol/L — ABNORMAL LOW (ref 98–111)
Creatinine, Ser: 0.34 mg/dL — ABNORMAL LOW (ref 0.44–1.00)
GFR, Estimated: >60 mL/min (ref >60)
Glucose, Bld: 139 mg/dL — ABNORMAL HIGH (ref 70–99)
Potassium: 6 mmol/L — ABNORMAL HIGH (ref 3.5–5.1)
Sodium: 138 mmol/L (ref 135–145)

## 2021-08-26 LAB — PHOSPHORUS: Phosphorus: 3.7 mg/dL (ref 2.5–4.6)

## 2021-08-26 LAB — CBC
HCT: 32.9 % — ABNORMAL LOW (ref 36.0–46.0)
Hemoglobin: 10.5 g/dL — ABNORMAL LOW (ref 12.0–15.0)
MCH: 31.3 pg (ref 26.0–34.0)
MCHC: 31.9 g/dL (ref 30.0–36.0)
MCV: 98.2 fL (ref 80.0–100.0)
Platelets: 452 10*3/uL — ABNORMAL HIGH (ref 150–400)
RBC: 3.35 MIL/uL — ABNORMAL LOW (ref 3.87–5.11)
RDW: 15.4 % (ref 11.5–15.5)
WBC: 18.2 10*3/uL — ABNORMAL HIGH (ref 4.0–10.5)
nRBC: 0 % (ref 0.0–0.2)

## 2021-08-26 LAB — MAGNESIUM: Magnesium: 1.5 mg/dL — ABNORMAL LOW (ref 1.7–2.4)

## 2021-08-27 DIAGNOSIS — N179 Acute kidney failure, unspecified: Secondary | ICD-10-CM | POA: Diagnosis not present

## 2021-08-27 DIAGNOSIS — J9600 Acute respiratory failure, unspecified whether with hypoxia or hypercapnia: Secondary | ICD-10-CM | POA: Diagnosis not present

## 2021-08-27 DIAGNOSIS — G9340 Encephalopathy, unspecified: Secondary | ICD-10-CM | POA: Diagnosis not present

## 2021-08-27 DIAGNOSIS — E119 Type 2 diabetes mellitus without complications: Secondary | ICD-10-CM | POA: Diagnosis not present

## 2021-08-27 LAB — RENAL FUNCTION PANEL
Albumin: 1.9 g/dL — ABNORMAL LOW (ref 3.5–5.0)
Anion gap: 10 (ref 5–15)
BUN: 10 mg/dL (ref 8–23)
CO2: 34 mmol/L — ABNORMAL HIGH (ref 22–32)
Calcium: 9.1 mg/dL (ref 8.9–10.3)
Chloride: 93 mmol/L — ABNORMAL LOW (ref 98–111)
Creatinine, Ser: 0.31 mg/dL — ABNORMAL LOW (ref 0.44–1.00)
GFR, Estimated: >60 mL/min (ref >60)
Glucose, Bld: 142 mg/dL — ABNORMAL HIGH (ref 70–99)
Phosphorus: 4 mg/dL (ref 2.5–4.6)
Potassium: 3.9 mmol/L (ref 3.5–5.1)
Sodium: 137 mmol/L (ref 135–145)

## 2021-08-27 LAB — CBC
HCT: 30.9 % — ABNORMAL LOW (ref 36.0–46.0)
Hemoglobin: 9.6 g/dL — ABNORMAL LOW (ref 12.0–15.0)
MCH: 30.7 pg (ref 26.0–34.0)
MCHC: 31.1 g/dL (ref 30.0–36.0)
MCV: 98.7 fL (ref 80.0–100.0)
Platelets: 389 10*3/uL (ref 150–400)
RBC: 3.13 MIL/uL — ABNORMAL LOW (ref 3.87–5.11)
RDW: 15.4 % (ref 11.5–15.5)
WBC: 12.7 10*3/uL — ABNORMAL HIGH (ref 4.0–10.5)
nRBC: 0 % (ref 0.0–0.2)

## 2021-08-27 LAB — MAGNESIUM
Magnesium: 1.5 mg/dL — ABNORMAL LOW (ref 1.7–2.4)
Magnesium: 2.1 mg/dL (ref 1.7–2.4)

## 2021-08-28 DIAGNOSIS — G9340 Encephalopathy, unspecified: Secondary | ICD-10-CM | POA: Diagnosis not present

## 2021-08-28 DIAGNOSIS — J9600 Acute respiratory failure, unspecified whether with hypoxia or hypercapnia: Secondary | ICD-10-CM | POA: Diagnosis not present

## 2021-08-28 DIAGNOSIS — N179 Acute kidney failure, unspecified: Secondary | ICD-10-CM | POA: Diagnosis not present

## 2021-08-28 DIAGNOSIS — E119 Type 2 diabetes mellitus without complications: Secondary | ICD-10-CM | POA: Diagnosis not present

## 2021-08-28 LAB — SARS CORONAVIRUS 2 (TAT 6-24 HRS): SARS Coronavirus 2: NEGATIVE

## 2021-08-29 DIAGNOSIS — N179 Acute kidney failure, unspecified: Secondary | ICD-10-CM | POA: Diagnosis not present

## 2021-08-29 DIAGNOSIS — J9600 Acute respiratory failure, unspecified whether with hypoxia or hypercapnia: Secondary | ICD-10-CM | POA: Diagnosis not present

## 2021-08-29 DIAGNOSIS — G9340 Encephalopathy, unspecified: Secondary | ICD-10-CM | POA: Diagnosis not present

## 2021-08-29 DIAGNOSIS — E119 Type 2 diabetes mellitus without complications: Secondary | ICD-10-CM | POA: Diagnosis not present

## 2021-08-30 DIAGNOSIS — Z48815 Encounter for surgical aftercare following surgery on the digestive system: Secondary | ICD-10-CM | POA: Diagnosis not present

## 2021-08-30 DIAGNOSIS — K219 Gastro-esophageal reflux disease without esophagitis: Secondary | ICD-10-CM | POA: Diagnosis not present

## 2021-08-30 DIAGNOSIS — M6259 Muscle wasting and atrophy, not elsewhere classified, multiple sites: Secondary | ICD-10-CM | POA: Diagnosis not present

## 2021-08-30 DIAGNOSIS — R0689 Other abnormalities of breathing: Secondary | ICD-10-CM | POA: Diagnosis not present

## 2021-08-30 DIAGNOSIS — J189 Pneumonia, unspecified organism: Secondary | ICD-10-CM | POA: Diagnosis not present

## 2021-08-30 DIAGNOSIS — F3340 Major depressive disorder, recurrent, in remission, unspecified: Secondary | ICD-10-CM | POA: Diagnosis not present

## 2021-08-30 DIAGNOSIS — R278 Other lack of coordination: Secondary | ICD-10-CM | POA: Diagnosis not present

## 2021-08-30 DIAGNOSIS — N179 Acute kidney failure, unspecified: Secondary | ICD-10-CM | POA: Diagnosis not present

## 2021-08-30 DIAGNOSIS — I509 Heart failure, unspecified: Secondary | ICD-10-CM | POA: Diagnosis not present

## 2021-08-30 DIAGNOSIS — E785 Hyperlipidemia, unspecified: Secondary | ICD-10-CM | POA: Diagnosis not present

## 2021-08-30 DIAGNOSIS — E039 Hypothyroidism, unspecified: Secondary | ICD-10-CM | POA: Diagnosis not present

## 2021-08-30 DIAGNOSIS — F339 Major depressive disorder, recurrent, unspecified: Secondary | ICD-10-CM | POA: Diagnosis not present

## 2021-08-30 DIAGNOSIS — K5900 Constipation, unspecified: Secondary | ICD-10-CM | POA: Diagnosis not present

## 2021-08-30 DIAGNOSIS — R197 Diarrhea, unspecified: Secondary | ICD-10-CM | POA: Diagnosis not present

## 2021-08-30 DIAGNOSIS — E119 Type 2 diabetes mellitus without complications: Secondary | ICD-10-CM | POA: Diagnosis not present

## 2021-08-30 DIAGNOSIS — I10 Essential (primary) hypertension: Secondary | ICD-10-CM | POA: Diagnosis not present

## 2021-08-30 DIAGNOSIS — R531 Weakness: Secondary | ICD-10-CM | POA: Diagnosis not present

## 2021-08-30 DIAGNOSIS — E43 Unspecified severe protein-calorie malnutrition: Secondary | ICD-10-CM | POA: Diagnosis not present

## 2021-08-30 DIAGNOSIS — R918 Other nonspecific abnormal finding of lung field: Secondary | ICD-10-CM | POA: Diagnosis not present

## 2021-08-30 DIAGNOSIS — R229 Localized swelling, mass and lump, unspecified: Secondary | ICD-10-CM | POA: Diagnosis not present

## 2021-08-30 DIAGNOSIS — J9600 Acute respiratory failure, unspecified whether with hypoxia or hypercapnia: Secondary | ICD-10-CM | POA: Diagnosis not present

## 2021-08-30 DIAGNOSIS — R0602 Shortness of breath: Secondary | ICD-10-CM | POA: Diagnosis not present

## 2021-08-30 DIAGNOSIS — E559 Vitamin D deficiency, unspecified: Secondary | ICD-10-CM | POA: Diagnosis not present

## 2021-08-30 DIAGNOSIS — R1312 Dysphagia, oropharyngeal phase: Secondary | ICD-10-CM | POA: Diagnosis not present

## 2021-08-30 DIAGNOSIS — G9340 Encephalopathy, unspecified: Secondary | ICD-10-CM | POA: Diagnosis not present

## 2021-08-30 DIAGNOSIS — R300 Dysuria: Secondary | ICD-10-CM | POA: Diagnosis not present

## 2021-08-30 DIAGNOSIS — G894 Chronic pain syndrome: Secondary | ICD-10-CM | POA: Diagnosis not present

## 2021-08-30 DIAGNOSIS — Z743 Need for continuous supervision: Secondary | ICD-10-CM | POA: Diagnosis not present

## 2021-08-30 DIAGNOSIS — Z931 Gastrostomy status: Secondary | ICD-10-CM | POA: Diagnosis not present

## 2021-08-30 DIAGNOSIS — Z930 Tracheostomy status: Secondary | ICD-10-CM | POA: Diagnosis not present

## 2021-08-30 DIAGNOSIS — J15212 Pneumonia due to Methicillin resistant Staphylococcus aureus: Secondary | ICD-10-CM | POA: Diagnosis not present

## 2021-08-30 DIAGNOSIS — J0100 Acute maxillary sinusitis, unspecified: Secondary | ICD-10-CM | POA: Diagnosis not present

## 2021-08-30 DIAGNOSIS — J019 Acute sinusitis, unspecified: Secondary | ICD-10-CM | POA: Diagnosis not present

## 2021-08-30 DIAGNOSIS — R6510 Systemic inflammatory response syndrome (SIRS) of non-infectious origin without acute organ dysfunction: Secondary | ICD-10-CM | POA: Diagnosis not present

## 2021-08-30 DIAGNOSIS — R1310 Dysphagia, unspecified: Secondary | ICD-10-CM | POA: Diagnosis not present

## 2021-08-30 DIAGNOSIS — E1140 Type 2 diabetes mellitus with diabetic neuropathy, unspecified: Secondary | ICD-10-CM | POA: Diagnosis not present

## 2021-08-30 DIAGNOSIS — J9601 Acute respiratory failure with hypoxia: Secondary | ICD-10-CM | POA: Diagnosis not present

## 2021-08-30 DIAGNOSIS — R233 Spontaneous ecchymoses: Secondary | ICD-10-CM | POA: Diagnosis not present

## 2021-08-30 DIAGNOSIS — E46 Unspecified protein-calorie malnutrition: Secondary | ICD-10-CM | POA: Diagnosis not present

## 2021-09-02 DIAGNOSIS — I10 Essential (primary) hypertension: Secondary | ICD-10-CM | POA: Diagnosis not present

## 2021-09-02 DIAGNOSIS — Z48815 Encounter for surgical aftercare following surgery on the digestive system: Secondary | ICD-10-CM | POA: Diagnosis not present

## 2021-09-02 DIAGNOSIS — Z930 Tracheostomy status: Secondary | ICD-10-CM | POA: Diagnosis not present

## 2021-09-02 DIAGNOSIS — Z931 Gastrostomy status: Secondary | ICD-10-CM | POA: Diagnosis not present

## 2021-09-02 DIAGNOSIS — E46 Unspecified protein-calorie malnutrition: Secondary | ICD-10-CM | POA: Diagnosis not present

## 2021-09-02 DIAGNOSIS — G894 Chronic pain syndrome: Secondary | ICD-10-CM | POA: Diagnosis not present

## 2021-09-02 DIAGNOSIS — R197 Diarrhea, unspecified: Secondary | ICD-10-CM | POA: Diagnosis not present

## 2021-09-02 DIAGNOSIS — E785 Hyperlipidemia, unspecified: Secondary | ICD-10-CM | POA: Diagnosis not present

## 2021-09-02 DIAGNOSIS — J019 Acute sinusitis, unspecified: Secondary | ICD-10-CM | POA: Diagnosis not present

## 2021-09-02 DIAGNOSIS — E039 Hypothyroidism, unspecified: Secondary | ICD-10-CM | POA: Diagnosis not present

## 2021-09-02 DIAGNOSIS — J15212 Pneumonia due to Methicillin resistant Staphylococcus aureus: Secondary | ICD-10-CM | POA: Diagnosis not present

## 2021-09-02 DIAGNOSIS — J9601 Acute respiratory failure with hypoxia: Secondary | ICD-10-CM | POA: Diagnosis not present

## 2021-09-03 DIAGNOSIS — E785 Hyperlipidemia, unspecified: Secondary | ICD-10-CM | POA: Diagnosis not present

## 2021-09-03 DIAGNOSIS — E039 Hypothyroidism, unspecified: Secondary | ICD-10-CM | POA: Diagnosis not present

## 2021-09-03 DIAGNOSIS — R1310 Dysphagia, unspecified: Secondary | ICD-10-CM | POA: Diagnosis not present

## 2021-09-03 DIAGNOSIS — E43 Unspecified severe protein-calorie malnutrition: Secondary | ICD-10-CM | POA: Diagnosis not present

## 2021-09-03 DIAGNOSIS — J9600 Acute respiratory failure, unspecified whether with hypoxia or hypercapnia: Secondary | ICD-10-CM | POA: Diagnosis not present

## 2021-09-03 DIAGNOSIS — R197 Diarrhea, unspecified: Secondary | ICD-10-CM | POA: Diagnosis not present

## 2021-09-03 DIAGNOSIS — J9601 Acute respiratory failure with hypoxia: Secondary | ICD-10-CM | POA: Diagnosis not present

## 2021-09-03 DIAGNOSIS — E119 Type 2 diabetes mellitus without complications: Secondary | ICD-10-CM | POA: Diagnosis not present

## 2021-09-03 DIAGNOSIS — Z48815 Encounter for surgical aftercare following surgery on the digestive system: Secondary | ICD-10-CM | POA: Diagnosis not present

## 2021-09-03 DIAGNOSIS — K219 Gastro-esophageal reflux disease without esophagitis: Secondary | ICD-10-CM | POA: Diagnosis not present

## 2021-09-10 DIAGNOSIS — Z931 Gastrostomy status: Secondary | ICD-10-CM | POA: Diagnosis not present

## 2021-09-10 DIAGNOSIS — K219 Gastro-esophageal reflux disease without esophagitis: Secondary | ICD-10-CM | POA: Diagnosis not present

## 2021-09-10 DIAGNOSIS — J019 Acute sinusitis, unspecified: Secondary | ICD-10-CM | POA: Diagnosis not present

## 2021-09-10 DIAGNOSIS — G894 Chronic pain syndrome: Secondary | ICD-10-CM | POA: Diagnosis not present

## 2021-09-10 DIAGNOSIS — R197 Diarrhea, unspecified: Secondary | ICD-10-CM | POA: Diagnosis not present

## 2021-09-10 DIAGNOSIS — I10 Essential (primary) hypertension: Secondary | ICD-10-CM | POA: Diagnosis not present

## 2021-09-10 DIAGNOSIS — J15212 Pneumonia due to Methicillin resistant Staphylococcus aureus: Secondary | ICD-10-CM | POA: Diagnosis not present

## 2021-09-10 DIAGNOSIS — J9601 Acute respiratory failure with hypoxia: Secondary | ICD-10-CM | POA: Diagnosis not present

## 2021-09-10 DIAGNOSIS — R1310 Dysphagia, unspecified: Secondary | ICD-10-CM | POA: Diagnosis not present

## 2021-09-10 DIAGNOSIS — Z48815 Encounter for surgical aftercare following surgery on the digestive system: Secondary | ICD-10-CM | POA: Diagnosis not present

## 2021-09-12 DIAGNOSIS — J019 Acute sinusitis, unspecified: Secondary | ICD-10-CM | POA: Diagnosis not present

## 2021-09-12 DIAGNOSIS — G894 Chronic pain syndrome: Secondary | ICD-10-CM | POA: Diagnosis not present

## 2021-09-12 DIAGNOSIS — R1310 Dysphagia, unspecified: Secondary | ICD-10-CM | POA: Diagnosis not present

## 2021-09-12 DIAGNOSIS — Z931 Gastrostomy status: Secondary | ICD-10-CM | POA: Diagnosis not present

## 2021-09-12 DIAGNOSIS — Z48815 Encounter for surgical aftercare following surgery on the digestive system: Secondary | ICD-10-CM | POA: Diagnosis not present

## 2021-09-12 DIAGNOSIS — I10 Essential (primary) hypertension: Secondary | ICD-10-CM | POA: Diagnosis not present

## 2021-09-12 DIAGNOSIS — R197 Diarrhea, unspecified: Secondary | ICD-10-CM | POA: Diagnosis not present

## 2021-09-12 DIAGNOSIS — K219 Gastro-esophageal reflux disease without esophagitis: Secondary | ICD-10-CM | POA: Diagnosis not present

## 2021-09-12 DIAGNOSIS — J9601 Acute respiratory failure with hypoxia: Secondary | ICD-10-CM | POA: Diagnosis not present

## 2021-09-16 DIAGNOSIS — G894 Chronic pain syndrome: Secondary | ICD-10-CM | POA: Diagnosis not present

## 2021-09-16 DIAGNOSIS — E039 Hypothyroidism, unspecified: Secondary | ICD-10-CM | POA: Diagnosis not present

## 2021-09-16 DIAGNOSIS — Z48815 Encounter for surgical aftercare following surgery on the digestive system: Secondary | ICD-10-CM | POA: Diagnosis not present

## 2021-09-16 DIAGNOSIS — R1310 Dysphagia, unspecified: Secondary | ICD-10-CM | POA: Diagnosis not present

## 2021-09-16 DIAGNOSIS — J9601 Acute respiratory failure with hypoxia: Secondary | ICD-10-CM | POA: Diagnosis not present

## 2021-09-16 DIAGNOSIS — K219 Gastro-esophageal reflux disease without esophagitis: Secondary | ICD-10-CM | POA: Diagnosis not present

## 2021-09-16 DIAGNOSIS — R197 Diarrhea, unspecified: Secondary | ICD-10-CM | POA: Diagnosis not present

## 2021-09-16 DIAGNOSIS — I10 Essential (primary) hypertension: Secondary | ICD-10-CM | POA: Diagnosis not present

## 2021-09-16 DIAGNOSIS — E785 Hyperlipidemia, unspecified: Secondary | ICD-10-CM | POA: Diagnosis not present

## 2021-09-16 DIAGNOSIS — E119 Type 2 diabetes mellitus without complications: Secondary | ICD-10-CM | POA: Diagnosis not present

## 2021-09-16 DIAGNOSIS — Z931 Gastrostomy status: Secondary | ICD-10-CM | POA: Diagnosis not present

## 2021-09-20 DIAGNOSIS — E039 Hypothyroidism, unspecified: Secondary | ICD-10-CM | POA: Diagnosis not present

## 2021-09-20 DIAGNOSIS — R197 Diarrhea, unspecified: Secondary | ICD-10-CM | POA: Diagnosis not present

## 2021-09-20 DIAGNOSIS — G894 Chronic pain syndrome: Secondary | ICD-10-CM | POA: Diagnosis not present

## 2021-09-20 DIAGNOSIS — E785 Hyperlipidemia, unspecified: Secondary | ICD-10-CM | POA: Diagnosis not present

## 2021-09-20 DIAGNOSIS — J9601 Acute respiratory failure with hypoxia: Secondary | ICD-10-CM | POA: Diagnosis not present

## 2021-09-20 DIAGNOSIS — Z48815 Encounter for surgical aftercare following surgery on the digestive system: Secondary | ICD-10-CM | POA: Diagnosis not present

## 2021-09-20 DIAGNOSIS — Z931 Gastrostomy status: Secondary | ICD-10-CM | POA: Diagnosis not present

## 2021-09-20 DIAGNOSIS — K219 Gastro-esophageal reflux disease without esophagitis: Secondary | ICD-10-CM | POA: Diagnosis not present

## 2021-09-20 DIAGNOSIS — R1310 Dysphagia, unspecified: Secondary | ICD-10-CM | POA: Diagnosis not present

## 2021-09-20 DIAGNOSIS — E119 Type 2 diabetes mellitus without complications: Secondary | ICD-10-CM | POA: Diagnosis not present

## 2021-09-25 DIAGNOSIS — I10 Essential (primary) hypertension: Secondary | ICD-10-CM | POA: Diagnosis not present

## 2021-09-25 DIAGNOSIS — G894 Chronic pain syndrome: Secondary | ICD-10-CM | POA: Diagnosis not present

## 2021-09-25 DIAGNOSIS — E119 Type 2 diabetes mellitus without complications: Secondary | ICD-10-CM | POA: Diagnosis not present

## 2021-09-25 DIAGNOSIS — R1310 Dysphagia, unspecified: Secondary | ICD-10-CM | POA: Diagnosis not present

## 2021-09-25 DIAGNOSIS — R197 Diarrhea, unspecified: Secondary | ICD-10-CM | POA: Diagnosis not present

## 2021-09-25 DIAGNOSIS — R300 Dysuria: Secondary | ICD-10-CM | POA: Diagnosis not present

## 2021-09-25 DIAGNOSIS — M6259 Muscle wasting and atrophy, not elsewhere classified, multiple sites: Secondary | ICD-10-CM | POA: Diagnosis not present

## 2021-09-25 DIAGNOSIS — Z930 Tracheostomy status: Secondary | ICD-10-CM | POA: Diagnosis not present

## 2021-09-25 DIAGNOSIS — Z931 Gastrostomy status: Secondary | ICD-10-CM | POA: Diagnosis not present

## 2021-09-25 DIAGNOSIS — K219 Gastro-esophageal reflux disease without esophagitis: Secondary | ICD-10-CM | POA: Diagnosis not present

## 2021-09-25 DIAGNOSIS — Z48815 Encounter for surgical aftercare following surgery on the digestive system: Secondary | ICD-10-CM | POA: Diagnosis not present

## 2021-09-28 IMAGING — CT CT CHEST-ABD-PELV W/O CM
2 of 5 series · 14 of 46 positions shown, 16 images · non-contrast
Comparison: Chest radiograph dated 07/20/2021. Abdominal radiograph
dated 07/13/2021.

CLINICAL DATA: Leukocytosis

EXAM:
CT CHEST, ABDOMEN AND PELVIS WITHOUT CONTRAST
TECHNIQUE: Multidetector CT imaging of the chest, abdomen and pelvis was
performed following the standard protocol without IV contrast.

[Series 6: cap w/o 2.0 mm st · axial · non-contrast · 0.98mm/px · z∈[-522,+80]mm · 11 of 345 slices shown, 13 images]
[im 22/345  soft-tissue]
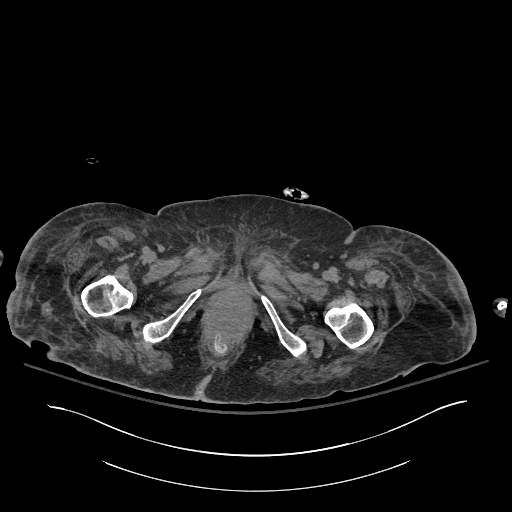
[im 22/345  bone]
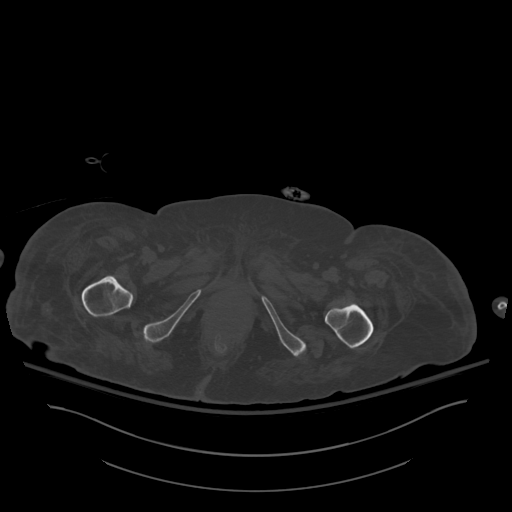
[im 65/345  soft-tissue]
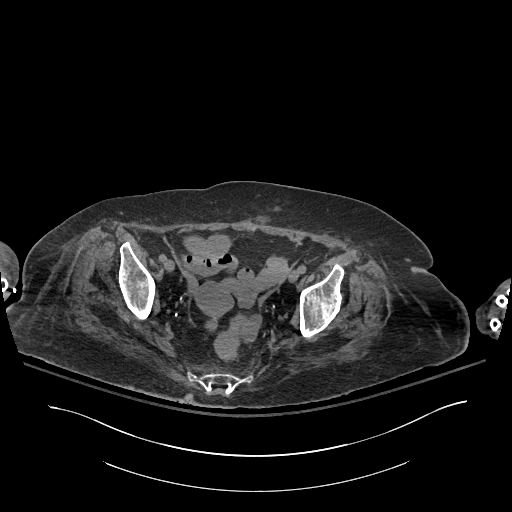
[im 87/345  soft-tissue]
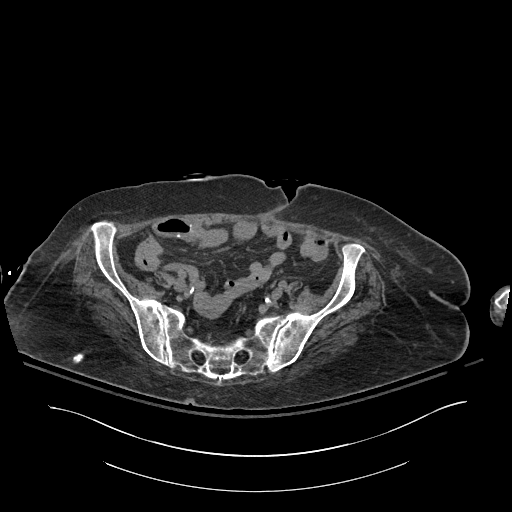
[im 108/345  soft-tissue]
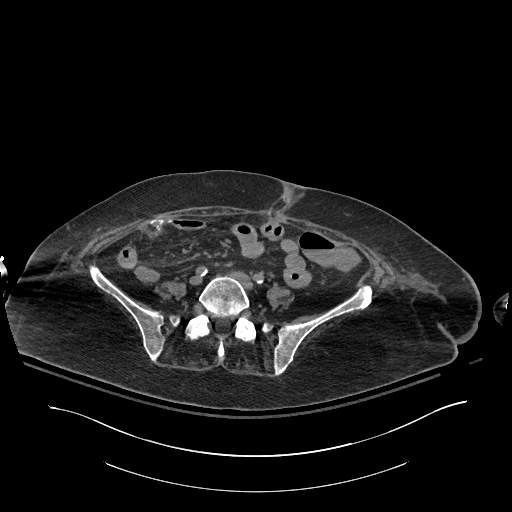
[im 151/345  soft-tissue]
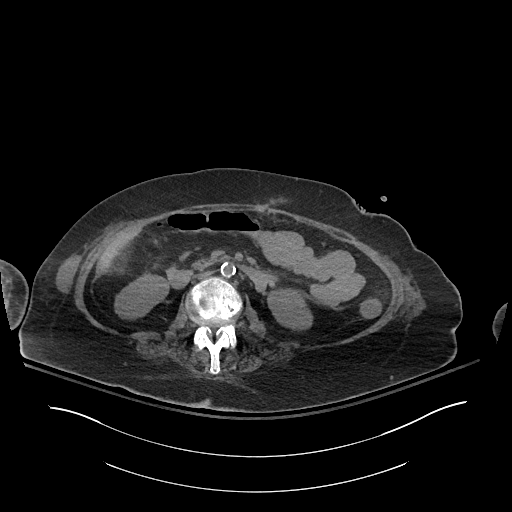
[im 173/345  soft-tissue]
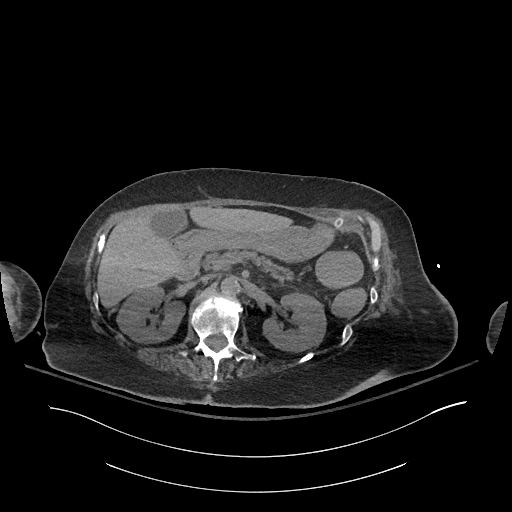
[im 194/345  soft-tissue]
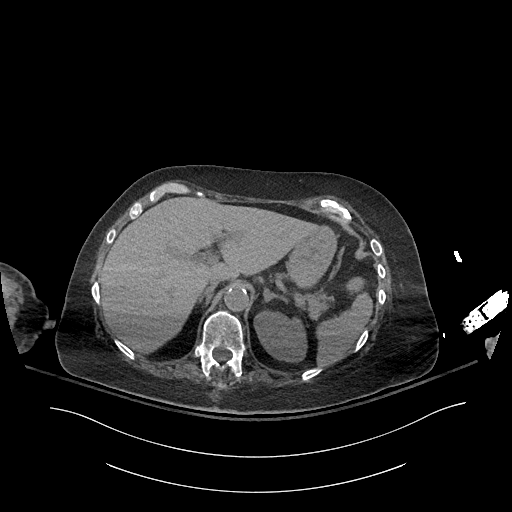
[im 237/345  soft-tissue]
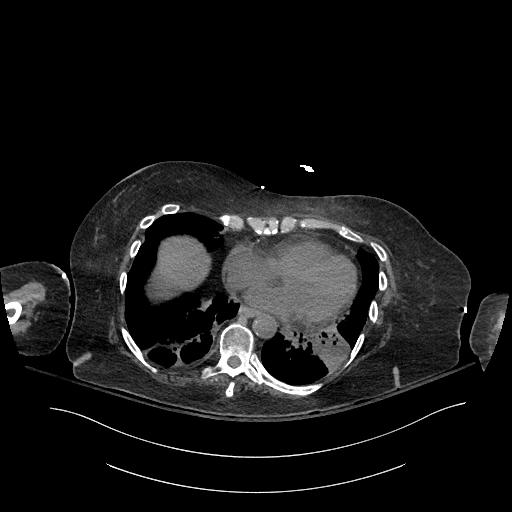
[im 259/345  soft-tissue]
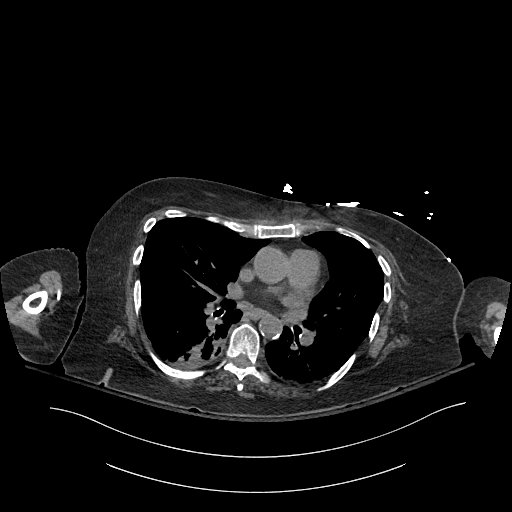
[im 259/345  bone]
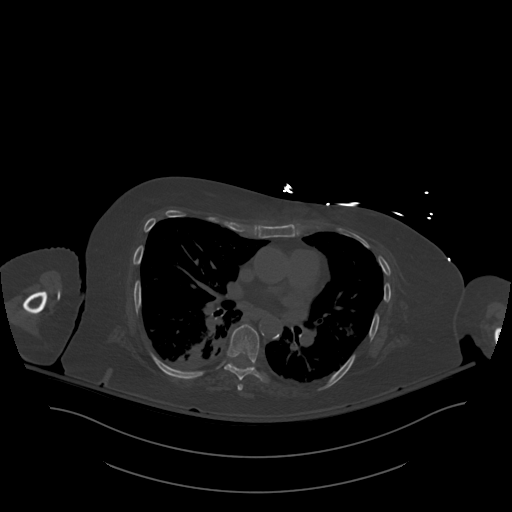
[im 280/345  soft-tissue]
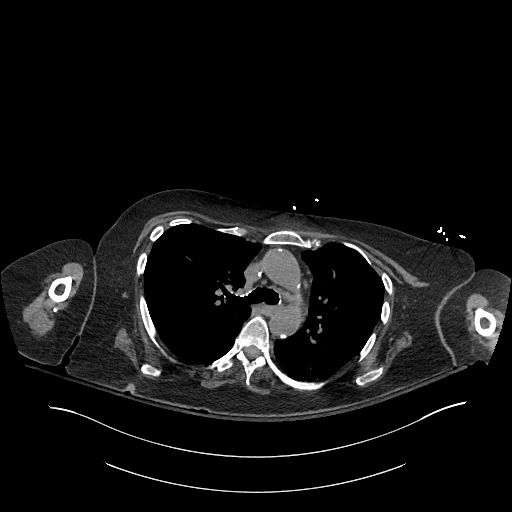
[im 323/345  soft-tissue]
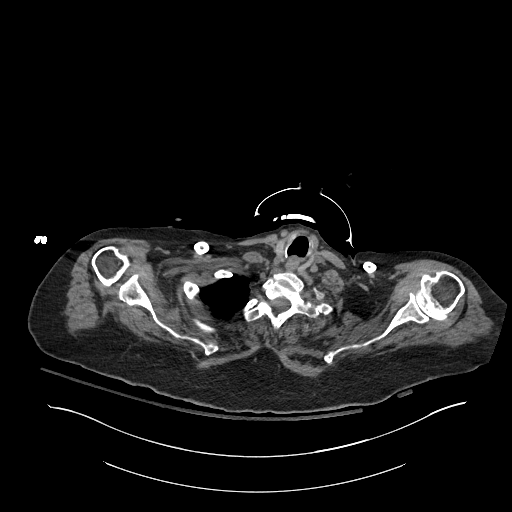

[Series 7: cap w/o 3.0 mm st cor · coronal · non-contrast · 0.93mm/px · 3 of 70 slices shown]
[im 24/70  soft-tissue]
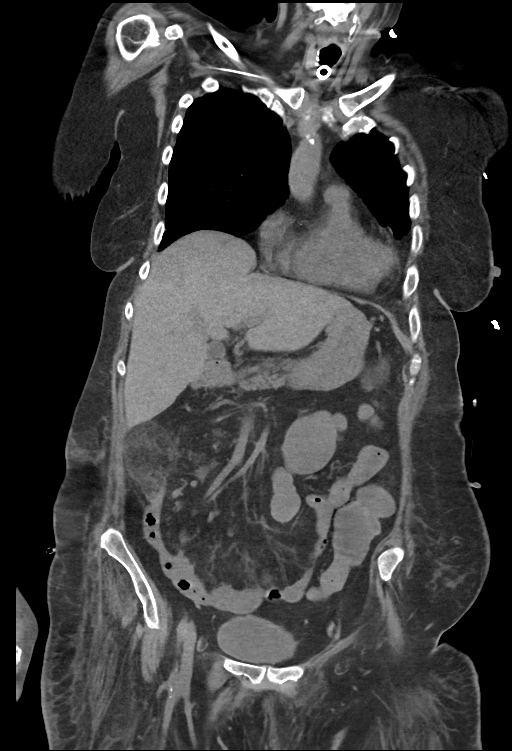
[im 31/70  soft-tissue]
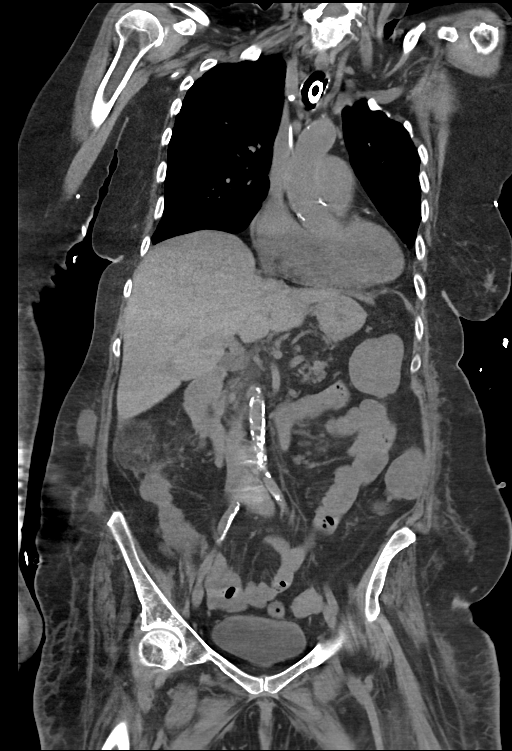
[im 39/70  soft-tissue]
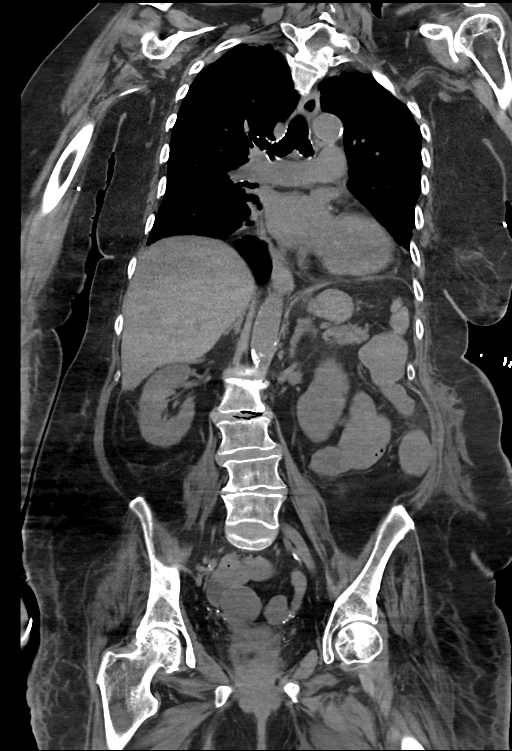

[14 of 46 positions shown; findings below may reference images not displayed]

FINDINGS: CT CHEST FINDINGS

Cardiovascular: The heart is normal in size. No pericardial
effusion.

No evidence of thoracic aortic aneurysm. Atherosclerotic
calcifications of the aortic arch.

Coronary atherosclerosis of the LAD and left circumflex.

Mediastinum/Nodes: No suspicious mediastinal lymphadenopathy.

Visualized thyroid is unremarkable.

Lungs/Pleura: Tracheostomy in satisfactory position.

Mild biapical pleural-parenchymal scarring.

Patchy bilateral lower lobe opacities, possibly reflecting
atelectasis, although superimposed tree-in-bud opacities in the
superior segment right lower lobe (series 4/image 81) raise the
possibility of pneumonia.

Faint ground-glass opacities in the bilateral upper lobes (for
example, series 4/image [DATE] reflect mild infection/aspiration.

No pleural effusion or pneumothorax.

Musculoskeletal: Visualized osseous structures are within normal
limits.

CT ABDOMEN PELVIS FINDINGS

Hepatobiliary: 8 mm cyst inferiorly in the right hepatic lobe
(series 3/image 35).

Gallbladder is unremarkable. No intrahepatic or extrahepatic ductal
dilatation.

Pancreas: Within normal limits.

Spleen: Within normal limits.

Adrenals/Urinary Tract: Adrenal glands are within normal limits.

Kidneys are within normal limits.  No hydronephrosis.

Bladder is within normal limits.

Stomach/Bowel: Stomach is notable for a percutaneous gastrostomy in
satisfactory position.

No evidence of bowel obstruction.

Status post right hemicolectomy with appendectomy.

Mild stranding in the mesenteric fat in the right mid abdomen
(series 3/image 84), reflecting postsurgical change.

No colonic wall thickening or inflammatory changes.  Rectal tube.

Vascular/Lymphatic: No evidence of abdominal aortic aneurysm.

Atherosclerotic calcifications of the abdominal aorta and branch
vessels.

No suspicious abdominopelvic lymphadenopathy.

Reproductive: Status post hysterectomy.

Bilateral ovaries are within normal limits.

Other: No abdominopelvic ascites.

Musculoskeletal: Old right 2nd and 3rd transverse process fractures
(series 3/images 76 and 81), chronic.

Mild degenerative changes of the lumbar spine.
IMPRESSION: Patchy bilateral lower lobe opacities with faint ground-glass
opacities in the upper lobes. While some of this appearance may
reflect atelectasis, superimposed pneumonia is suspected.

Tracheostomy and percutaneous gastrostomy in satisfactory position.

Status post right hemicolectomy. Postsurgical change involving the
mesenteric fat in the right mid abdomen.

Additional ancillary findings as above.

## 2021-09-30 DIAGNOSIS — Z48815 Encounter for surgical aftercare following surgery on the digestive system: Secondary | ICD-10-CM | POA: Diagnosis not present

## 2021-09-30 DIAGNOSIS — R233 Spontaneous ecchymoses: Secondary | ICD-10-CM | POA: Diagnosis not present

## 2021-09-30 DIAGNOSIS — R1310 Dysphagia, unspecified: Secondary | ICD-10-CM | POA: Diagnosis not present

## 2021-09-30 DIAGNOSIS — F3340 Major depressive disorder, recurrent, in remission, unspecified: Secondary | ICD-10-CM | POA: Diagnosis not present

## 2021-09-30 DIAGNOSIS — E119 Type 2 diabetes mellitus without complications: Secondary | ICD-10-CM | POA: Diagnosis not present

## 2021-09-30 DIAGNOSIS — E039 Hypothyroidism, unspecified: Secondary | ICD-10-CM | POA: Diagnosis not present

## 2021-09-30 DIAGNOSIS — E785 Hyperlipidemia, unspecified: Secondary | ICD-10-CM | POA: Diagnosis not present

## 2021-09-30 DIAGNOSIS — G894 Chronic pain syndrome: Secondary | ICD-10-CM | POA: Diagnosis not present

## 2021-10-05 IMAGING — DX DG CHEST 1V PORT
1 series · 1 of 1 positions shown · non-contrast
Comparison: Chest x-ray 07/20/2021.

CLINICAL DATA: 70-year-old female with history of pneumonia.

EXAM:
PORTABLE CHEST 1 VIEW

[chest]
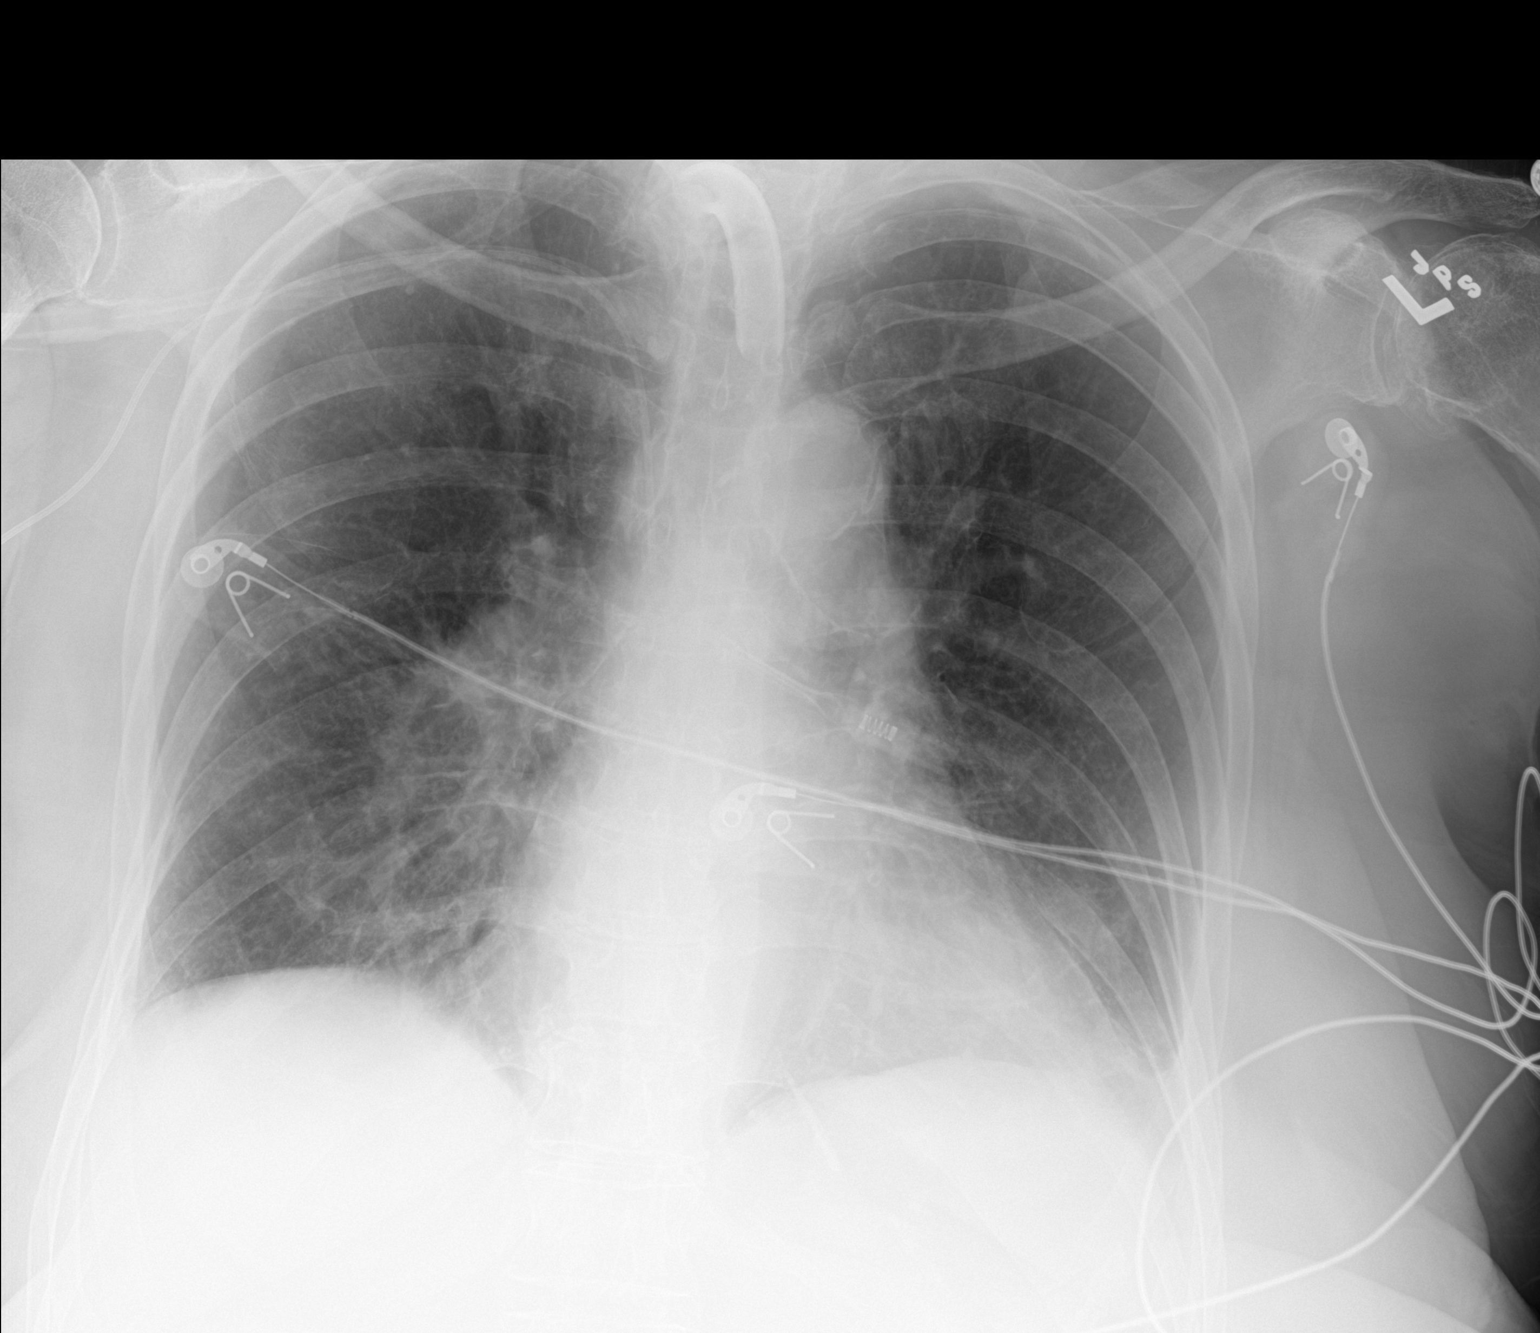

[1 of 1 positions shown; findings below may reference images not displayed]

FINDINGS: A tracheostomy tube is in place with tip 4.8 cm above the carina.
There is a right upper extremity PICC with tip terminating in the
mid superior vena cava. Lung volumes are low. There are some
ill-defined bibasilar opacities which may reflect areas of
atelectasis and/or consolidation, overall slightly improved compared
to the prior study. Small left pleural effusion. No definite right
pleural effusion. No pneumothorax. No definite suspicious appearing
pulmonary nodules or masses are noted. No evidence of pulmonary
edema. Heart size is normal. Upper mediastinal contours are within
normal limits. Atherosclerotic calcifications are noted in the
thoracic aorta.
IMPRESSION: 1. Support apparatus, as above.
2. Persistent areas of atelectasis and/or consolidation in the lung
bases bilaterally, slightly improved compared to the prior study.
3. Persistent small left pleural effusion.
4. Aortic atherosclerosis.

## 2021-10-07 DIAGNOSIS — E785 Hyperlipidemia, unspecified: Secondary | ICD-10-CM | POA: Diagnosis not present

## 2021-10-07 DIAGNOSIS — E1140 Type 2 diabetes mellitus with diabetic neuropathy, unspecified: Secondary | ICD-10-CM | POA: Diagnosis not present

## 2021-10-07 DIAGNOSIS — E039 Hypothyroidism, unspecified: Secondary | ICD-10-CM | POA: Diagnosis not present

## 2021-10-16 DIAGNOSIS — G894 Chronic pain syndrome: Secondary | ICD-10-CM | POA: Diagnosis not present

## 2021-10-16 DIAGNOSIS — K5900 Constipation, unspecified: Secondary | ICD-10-CM | POA: Diagnosis not present

## 2021-10-16 DIAGNOSIS — Z48815 Encounter for surgical aftercare following surgery on the digestive system: Secondary | ICD-10-CM | POA: Diagnosis not present

## 2021-10-16 DIAGNOSIS — F3340 Major depressive disorder, recurrent, in remission, unspecified: Secondary | ICD-10-CM | POA: Diagnosis not present

## 2021-10-18 DIAGNOSIS — K5900 Constipation, unspecified: Secondary | ICD-10-CM | POA: Diagnosis not present

## 2021-10-18 DIAGNOSIS — Z48815 Encounter for surgical aftercare following surgery on the digestive system: Secondary | ICD-10-CM | POA: Diagnosis not present

## 2021-10-18 DIAGNOSIS — F3340 Major depressive disorder, recurrent, in remission, unspecified: Secondary | ICD-10-CM | POA: Diagnosis not present

## 2021-10-25 DIAGNOSIS — E039 Hypothyroidism, unspecified: Secondary | ICD-10-CM | POA: Diagnosis not present

## 2021-10-25 DIAGNOSIS — G894 Chronic pain syndrome: Secondary | ICD-10-CM | POA: Diagnosis not present

## 2021-10-25 DIAGNOSIS — F3340 Major depressive disorder, recurrent, in remission, unspecified: Secondary | ICD-10-CM | POA: Diagnosis not present

## 2021-10-25 DIAGNOSIS — E119 Type 2 diabetes mellitus without complications: Secondary | ICD-10-CM | POA: Diagnosis not present

## 2021-10-25 DIAGNOSIS — Z48815 Encounter for surgical aftercare following surgery on the digestive system: Secondary | ICD-10-CM | POA: Diagnosis not present

## 2021-10-25 DIAGNOSIS — E785 Hyperlipidemia, unspecified: Secondary | ICD-10-CM | POA: Diagnosis not present

## 2021-10-27 IMAGING — CT CT CHEST-ABD-PELV W/ CM
2 of 5 series · 14 of 46 positions shown, 16 images · IV contrast (omnipaque)
Comparison: 07/23/2021

CLINICAL DATA: Leukocytosis, sepsis, status post right
hemicolectomy

EXAM:
CT CHEST, ABDOMEN, AND PELVIS WITH CONTRAST
TECHNIQUE: Multidetector CT imaging of the chest, abdomen and pelvis was
performed following the standard protocol during bolus
administration of intravenous contrast.
CONTRAST:  65mL OMNIPAQUE IOHEXOL 350 MG/ML SOLN

[Series 3: cap with · axial · 0.78mm/px · z∈[-756,-236]mm · 11 of 126 slices shown, 13 images]
[im 11/126  soft-tissue]
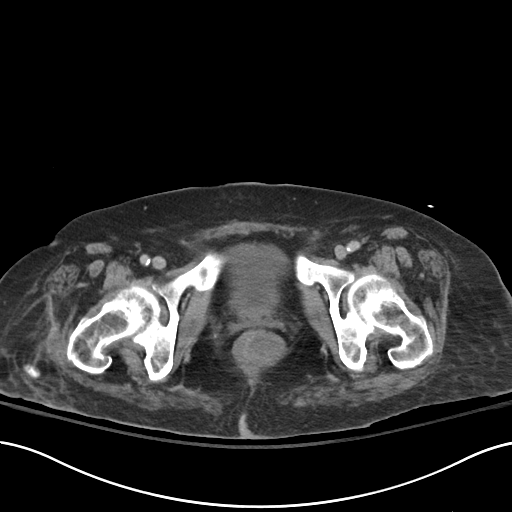
[im 11/126  bone]
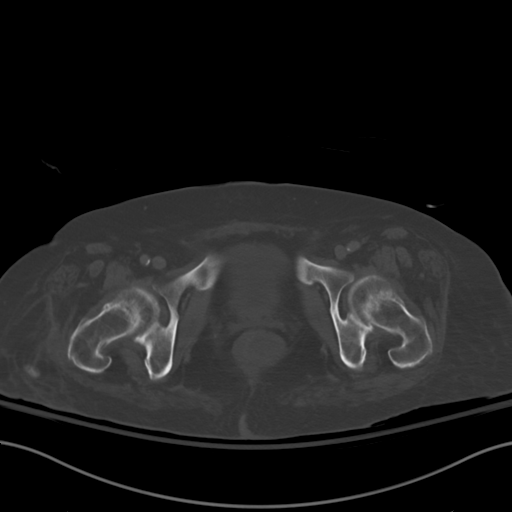
[im 21/126  soft-tissue]
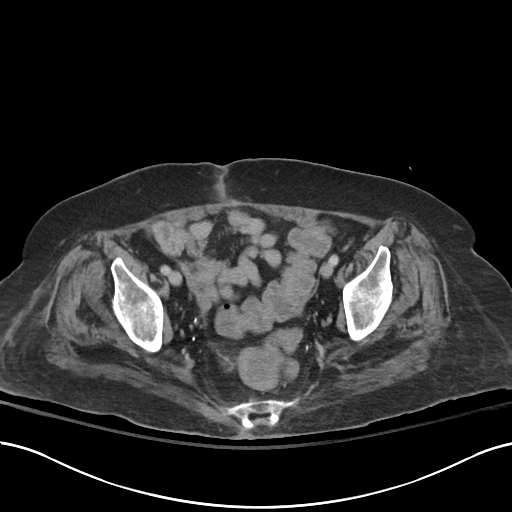
[im 32/126  soft-tissue]
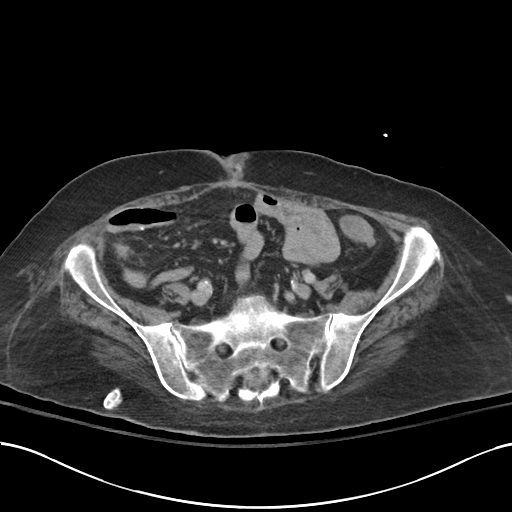
[im 42/126  soft-tissue]
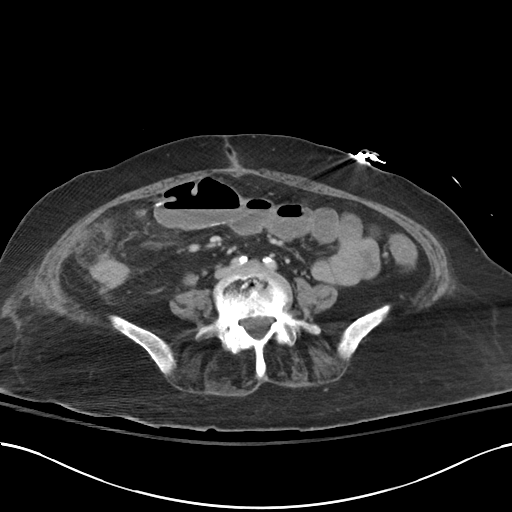
[im 53/126  soft-tissue]
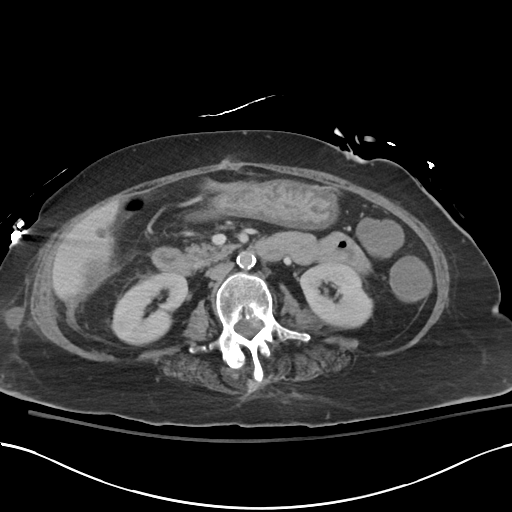
[im 63/126  soft-tissue]
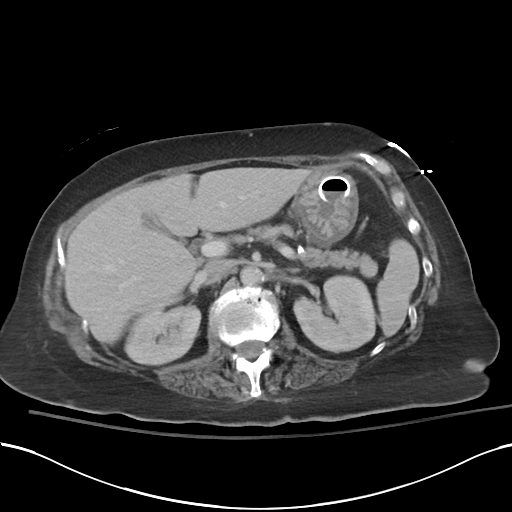
[im 73/126  soft-tissue]
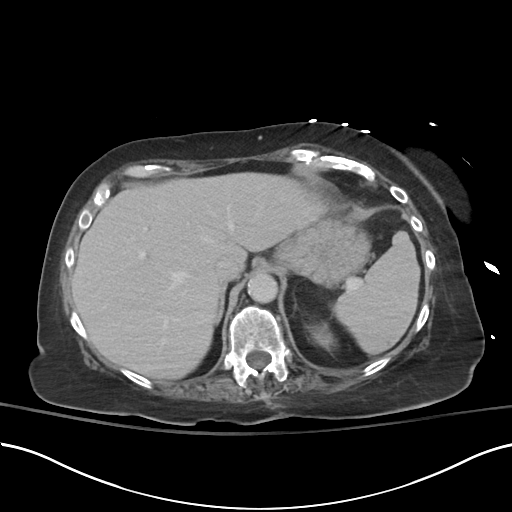
[im 84/126  soft-tissue]
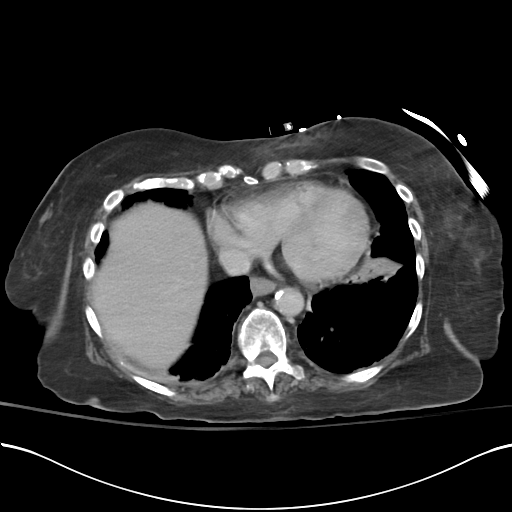
[im 94/126  soft-tissue]
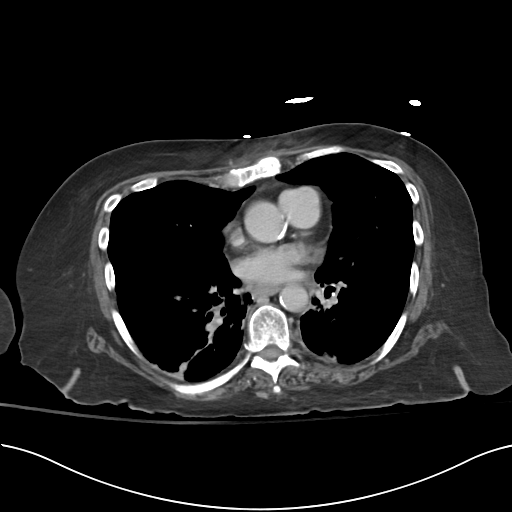
[im 94/126  bone]
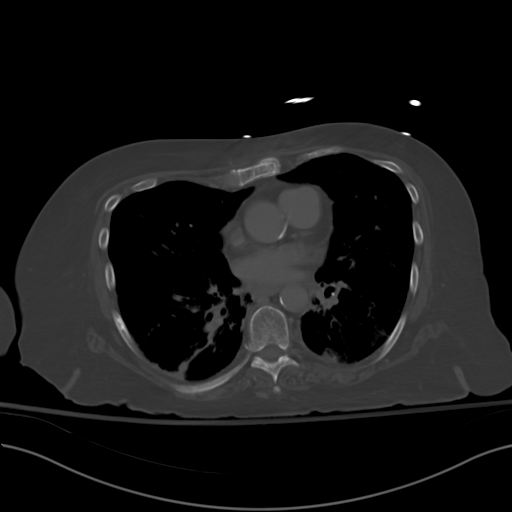
[im 105/126  soft-tissue]
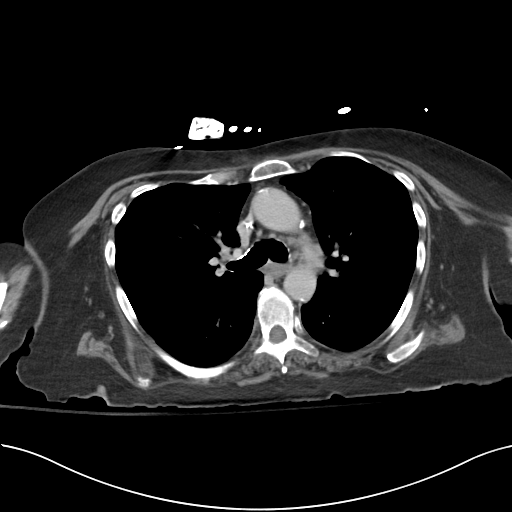
[im 115/126  soft-tissue]
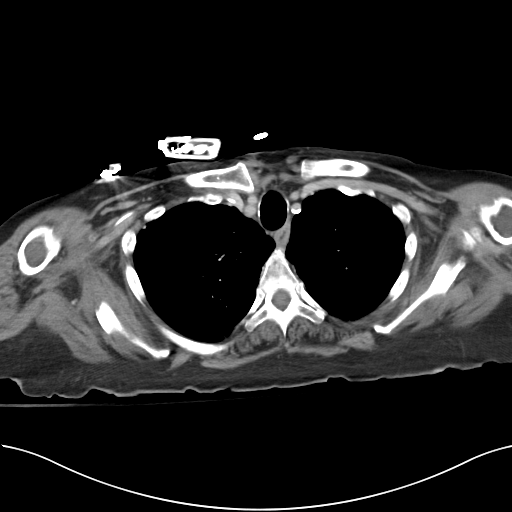

[Series 6: cor · coronal · 0.84mm/px · 3 of 111 slices shown]
[im 49/111  soft-tissue]
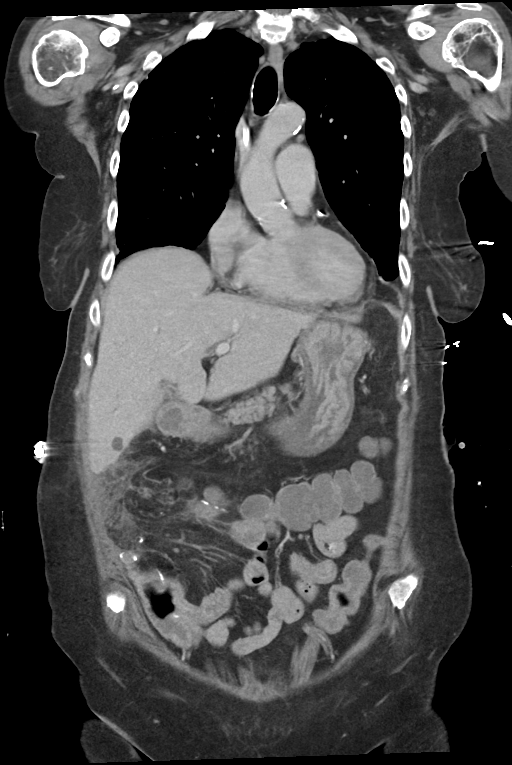
[im 62/111  soft-tissue]
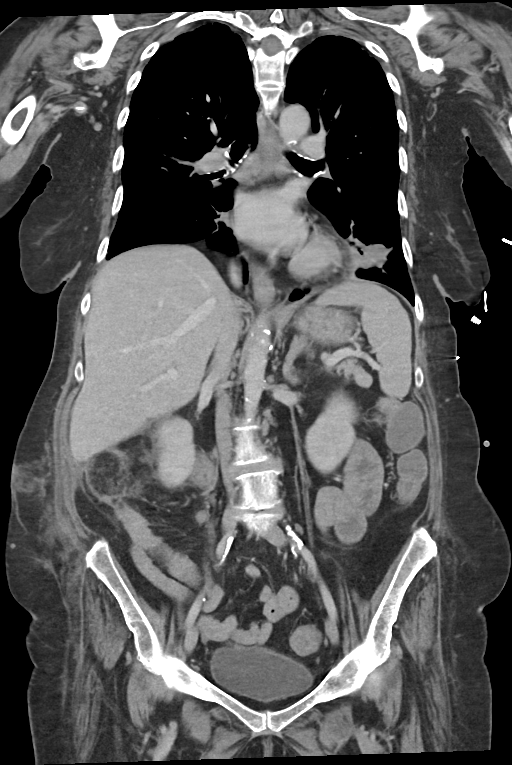
[im 74/111  soft-tissue]
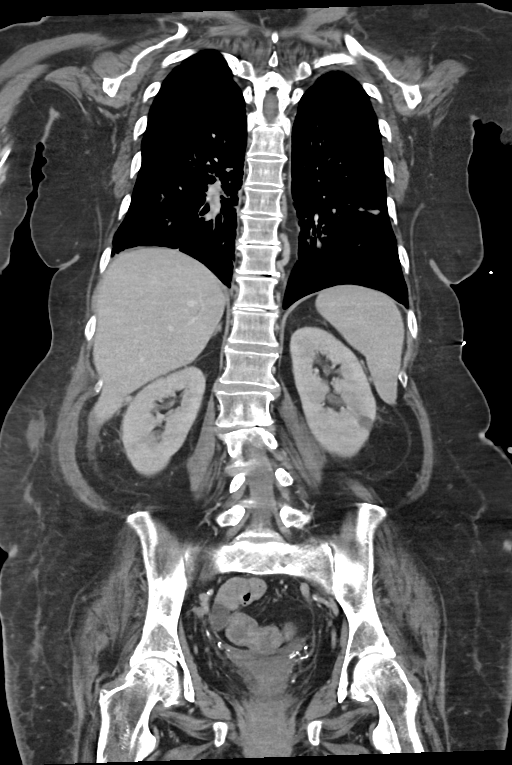

[14 of 46 positions shown; findings below may reference images not displayed]

FINDINGS: CT CHEST FINDINGS

Cardiovascular: The heart is normal in size. No pericardial
effusion.

No evidence of thoracic aortic aneurysm. Atherosclerotic
calcifications of the aortic arch.

Three vessel coronary atherosclerosis.

Mediastinum/Nodes: No suspicious mediastinal lymphadenopathy.

Visualized thyroid is unremarkable.

Lungs/Pleura: Postprocedural changes related to prior tracheostomy
(series 3/image 10).

Mild biapical pleural-parenchymal scarring.

Patchy bilateral lower lobe opacities, improved from prior CT,
favoring pneumonia although some degree of atelectasis is suspected.

No suspicious pulmonary nodules.

No pleural effusion or pneumothorax.

Musculoskeletal: Visualized osseous structures are within normal
limits.

CT ABDOMEN PELVIS FINDINGS

Hepatobiliary: Scattered probable hepatic cysts measuring up to 10
mm in the inferior right hepatic lobe (series 3/image 73).

Contracted gallbladder with mild gallbladder edema. No intrahepatic
or extrahepatic ductal dilatation.

Pancreas: Within normal limits.

Spleen: Within normal limits.

Adrenals/Urinary Tract: Adrenal glands are within normal limits.

12 mm lateral left upper pole renal cyst (series 3/image 31). Right
kidney is within normal limits. No hydronephrosis.

Bladder is underdistended but unremarkable.

Stomach/Bowel: Stomach is notable for a percutaneous gastrostomy in
satisfactory position.

No evidence of bowel obstruction.

Status post right hemicolectomy with appendectomy.

Vascular/Lymphatic: No evidence of abdominal aortic aneurysm.

Atherosclerotic calcifications of the abdominal aorta and branch
vessels.

No suspicious abdominopelvic lymphadenopathy.

Reproductive: Status post hysterectomy.

Bilateral ovaries are within normal limits.

Other: No abdominopelvic ascites.

Postsurgical changes along the anterior abdominal wall.

Mild mesenteric stranding/postsurgical changes in the right mid
abdomen.

Musculoskeletal: Mild degenerative changes of the lumbar spine.
IMPRESSION: Mild patchy bilateral lower lobe opacities, improved from prior CT,
favoring pneumonia although some degree of atelectasis is suspected.

Postsurgical changes related to prior right hemicolectomy. No acute
findings in the abdomen/pelvis.

## 2021-10-29 DIAGNOSIS — E039 Hypothyroidism, unspecified: Secondary | ICD-10-CM | POA: Diagnosis not present

## 2021-10-29 DIAGNOSIS — J0100 Acute maxillary sinusitis, unspecified: Secondary | ICD-10-CM | POA: Diagnosis not present

## 2021-10-29 DIAGNOSIS — F3340 Major depressive disorder, recurrent, in remission, unspecified: Secondary | ICD-10-CM | POA: Diagnosis not present

## 2021-10-29 DIAGNOSIS — E559 Vitamin D deficiency, unspecified: Secondary | ICD-10-CM | POA: Diagnosis not present

## 2021-10-29 DIAGNOSIS — E119 Type 2 diabetes mellitus without complications: Secondary | ICD-10-CM | POA: Diagnosis not present

## 2021-10-29 DIAGNOSIS — I10 Essential (primary) hypertension: Secondary | ICD-10-CM | POA: Diagnosis not present

## 2021-10-29 DIAGNOSIS — Z48815 Encounter for surgical aftercare following surgery on the digestive system: Secondary | ICD-10-CM | POA: Diagnosis not present

## 2021-10-30 DIAGNOSIS — R918 Other nonspecific abnormal finding of lung field: Secondary | ICD-10-CM | POA: Diagnosis not present

## 2021-10-30 DIAGNOSIS — J0100 Acute maxillary sinusitis, unspecified: Secondary | ICD-10-CM | POA: Diagnosis not present

## 2021-11-04 DIAGNOSIS — J189 Pneumonia, unspecified organism: Secondary | ICD-10-CM | POA: Diagnosis not present

## 2021-11-04 DIAGNOSIS — I509 Heart failure, unspecified: Secondary | ICD-10-CM | POA: Diagnosis not present

## 2021-11-04 DIAGNOSIS — J0100 Acute maxillary sinusitis, unspecified: Secondary | ICD-10-CM | POA: Diagnosis not present

## 2021-11-18 DIAGNOSIS — Z48815 Encounter for surgical aftercare following surgery on the digestive system: Secondary | ICD-10-CM | POA: Diagnosis not present

## 2021-11-18 DIAGNOSIS — E039 Hypothyroidism, unspecified: Secondary | ICD-10-CM | POA: Diagnosis not present

## 2021-11-18 DIAGNOSIS — E119 Type 2 diabetes mellitus without complications: Secondary | ICD-10-CM | POA: Diagnosis not present

## 2021-11-18 DIAGNOSIS — E785 Hyperlipidemia, unspecified: Secondary | ICD-10-CM | POA: Diagnosis not present

## 2021-11-18 DIAGNOSIS — I509 Heart failure, unspecified: Secondary | ICD-10-CM | POA: Diagnosis not present

## 2021-11-18 DIAGNOSIS — G894 Chronic pain syndrome: Secondary | ICD-10-CM | POA: Diagnosis not present

## 2021-11-21 DIAGNOSIS — I700 Atherosclerosis of aorta: Secondary | ICD-10-CM | POA: Diagnosis not present

## 2021-11-21 DIAGNOSIS — K769 Liver disease, unspecified: Secondary | ICD-10-CM | POA: Diagnosis not present

## 2021-11-21 DIAGNOSIS — D72829 Elevated white blood cell count, unspecified: Secondary | ICD-10-CM | POA: Diagnosis not present

## 2021-11-21 DIAGNOSIS — K219 Gastro-esophageal reflux disease without esophagitis: Secondary | ICD-10-CM | POA: Diagnosis not present

## 2021-11-21 DIAGNOSIS — R4182 Altered mental status, unspecified: Secondary | ICD-10-CM | POA: Diagnosis not present

## 2021-11-21 DIAGNOSIS — E039 Hypothyroidism, unspecified: Secondary | ICD-10-CM | POA: Diagnosis not present

## 2021-11-21 DIAGNOSIS — F32A Depression, unspecified: Secondary | ICD-10-CM | POA: Diagnosis not present

## 2021-11-21 DIAGNOSIS — E440 Moderate protein-calorie malnutrition: Secondary | ICD-10-CM | POA: Diagnosis not present

## 2021-11-21 DIAGNOSIS — K828 Other specified diseases of gallbladder: Secondary | ICD-10-CM | POA: Diagnosis not present

## 2021-11-21 DIAGNOSIS — G8929 Other chronic pain: Secondary | ICD-10-CM | POA: Diagnosis not present

## 2021-11-21 DIAGNOSIS — R1310 Dysphagia, unspecified: Secondary | ICD-10-CM | POA: Diagnosis not present

## 2021-11-21 DIAGNOSIS — R000 Tachycardia, unspecified: Secondary | ICD-10-CM | POA: Diagnosis not present

## 2021-11-21 DIAGNOSIS — J189 Pneumonia, unspecified organism: Secondary | ICD-10-CM | POA: Diagnosis not present

## 2021-11-21 DIAGNOSIS — Z931 Gastrostomy status: Secondary | ICD-10-CM | POA: Diagnosis not present

## 2021-11-21 DIAGNOSIS — E7800 Pure hypercholesterolemia, unspecified: Secondary | ICD-10-CM | POA: Diagnosis not present

## 2021-11-21 DIAGNOSIS — Z7984 Long term (current) use of oral hypoglycemic drugs: Secondary | ICD-10-CM | POA: Diagnosis not present

## 2021-11-21 DIAGNOSIS — Z20822 Contact with and (suspected) exposure to covid-19: Secondary | ICD-10-CM | POA: Diagnosis not present

## 2021-11-21 DIAGNOSIS — E1165 Type 2 diabetes mellitus with hyperglycemia: Secondary | ICD-10-CM | POA: Diagnosis not present

## 2021-11-21 DIAGNOSIS — E876 Hypokalemia: Secondary | ICD-10-CM | POA: Diagnosis not present

## 2021-11-21 DIAGNOSIS — A4102 Sepsis due to Methicillin resistant Staphylococcus aureus: Secondary | ICD-10-CM | POA: Diagnosis not present

## 2021-11-21 DIAGNOSIS — F1120 Opioid dependence, uncomplicated: Secondary | ICD-10-CM | POA: Diagnosis not present

## 2021-11-21 DIAGNOSIS — Z79899 Other long term (current) drug therapy: Secondary | ICD-10-CM | POA: Diagnosis not present

## 2021-11-21 DIAGNOSIS — R6520 Severe sepsis without septic shock: Secondary | ICD-10-CM | POA: Diagnosis not present

## 2021-11-21 DIAGNOSIS — J9601 Acute respiratory failure with hypoxia: Secondary | ICD-10-CM | POA: Diagnosis not present

## 2021-11-21 DIAGNOSIS — J9690 Respiratory failure, unspecified, unspecified whether with hypoxia or hypercapnia: Secondary | ICD-10-CM | POA: Diagnosis not present

## 2021-11-21 DIAGNOSIS — R0902 Hypoxemia: Secondary | ICD-10-CM | POA: Diagnosis not present

## 2021-11-21 DIAGNOSIS — J690 Pneumonitis due to inhalation of food and vomit: Secondary | ICD-10-CM | POA: Diagnosis not present

## 2021-11-21 DIAGNOSIS — R231 Pallor: Secondary | ICD-10-CM | POA: Diagnosis not present

## 2021-11-21 DIAGNOSIS — Z8711 Personal history of peptic ulcer disease: Secondary | ICD-10-CM | POA: Diagnosis not present

## 2021-11-21 DIAGNOSIS — G9341 Metabolic encephalopathy: Secondary | ICD-10-CM | POA: Diagnosis not present

## 2021-11-21 DIAGNOSIS — M1990 Unspecified osteoarthritis, unspecified site: Secondary | ICD-10-CM | POA: Diagnosis not present

## 2021-11-21 DIAGNOSIS — J15212 Pneumonia due to Methicillin resistant Staphylococcus aureus: Secondary | ICD-10-CM | POA: Diagnosis not present

## 2021-11-21 DIAGNOSIS — R059 Cough, unspecified: Secondary | ICD-10-CM | POA: Diagnosis not present

## 2021-11-21 DIAGNOSIS — R918 Other nonspecific abnormal finding of lung field: Secondary | ICD-10-CM | POA: Diagnosis not present

## 2021-11-21 DIAGNOSIS — Z6825 Body mass index (BMI) 25.0-25.9, adult: Secondary | ICD-10-CM | POA: Diagnosis not present

## 2021-11-21 DIAGNOSIS — F419 Anxiety disorder, unspecified: Secondary | ICD-10-CM | POA: Diagnosis not present

## 2021-11-22 DIAGNOSIS — J690 Pneumonitis due to inhalation of food and vomit: Secondary | ICD-10-CM | POA: Diagnosis not present

## 2021-11-22 DIAGNOSIS — J9601 Acute respiratory failure with hypoxia: Secondary | ICD-10-CM | POA: Diagnosis not present

## 2021-11-23 DIAGNOSIS — J9690 Respiratory failure, unspecified, unspecified whether with hypoxia or hypercapnia: Secondary | ICD-10-CM | POA: Diagnosis not present

## 2021-11-25 DIAGNOSIS — J9601 Acute respiratory failure with hypoxia: Secondary | ICD-10-CM | POA: Diagnosis not present

## 2021-11-25 DIAGNOSIS — I700 Atherosclerosis of aorta: Secondary | ICD-10-CM | POA: Diagnosis not present

## 2021-11-25 DIAGNOSIS — J189 Pneumonia, unspecified organism: Secondary | ICD-10-CM | POA: Diagnosis not present

## 2021-11-25 DIAGNOSIS — J690 Pneumonitis due to inhalation of food and vomit: Secondary | ICD-10-CM | POA: Diagnosis not present

## 2021-11-26 DIAGNOSIS — J690 Pneumonitis due to inhalation of food and vomit: Secondary | ICD-10-CM | POA: Diagnosis not present

## 2021-11-26 DIAGNOSIS — J9601 Acute respiratory failure with hypoxia: Secondary | ICD-10-CM | POA: Diagnosis not present

## 2021-11-27 DIAGNOSIS — R918 Other nonspecific abnormal finding of lung field: Secondary | ICD-10-CM | POA: Diagnosis not present

## 2021-11-27 DIAGNOSIS — J9601 Acute respiratory failure with hypoxia: Secondary | ICD-10-CM | POA: Diagnosis not present

## 2021-11-27 DIAGNOSIS — J690 Pneumonitis due to inhalation of food and vomit: Secondary | ICD-10-CM | POA: Diagnosis not present

## 2021-11-29 DIAGNOSIS — Z993 Dependence on wheelchair: Secondary | ICD-10-CM | POA: Diagnosis not present

## 2021-11-29 DIAGNOSIS — E039 Hypothyroidism, unspecified: Secondary | ICD-10-CM | POA: Diagnosis not present

## 2021-11-29 DIAGNOSIS — J690 Pneumonitis due to inhalation of food and vomit: Secondary | ICD-10-CM | POA: Diagnosis not present

## 2021-11-29 DIAGNOSIS — Z9181 History of falling: Secondary | ICD-10-CM | POA: Diagnosis not present

## 2021-11-29 DIAGNOSIS — K2900 Acute gastritis without bleeding: Secondary | ICD-10-CM | POA: Diagnosis not present

## 2021-11-29 DIAGNOSIS — Z931 Gastrostomy status: Secondary | ICD-10-CM | POA: Diagnosis not present

## 2021-11-29 DIAGNOSIS — I10 Essential (primary) hypertension: Secondary | ICD-10-CM | POA: Diagnosis not present

## 2021-11-29 DIAGNOSIS — G8921 Chronic pain due to trauma: Secondary | ICD-10-CM | POA: Diagnosis not present

## 2021-11-29 DIAGNOSIS — K219 Gastro-esophageal reflux disease without esophagitis: Secondary | ICD-10-CM | POA: Diagnosis not present

## 2021-11-29 DIAGNOSIS — M1990 Unspecified osteoarthritis, unspecified site: Secondary | ICD-10-CM | POA: Diagnosis not present

## 2021-11-29 DIAGNOSIS — Z87891 Personal history of nicotine dependence: Secondary | ICD-10-CM | POA: Diagnosis not present

## 2021-11-29 DIAGNOSIS — B9562 Methicillin resistant Staphylococcus aureus infection as the cause of diseases classified elsewhere: Secondary | ICD-10-CM | POA: Diagnosis not present

## 2021-11-29 DIAGNOSIS — Z7984 Long term (current) use of oral hypoglycemic drugs: Secondary | ICD-10-CM | POA: Diagnosis not present

## 2021-11-29 DIAGNOSIS — F419 Anxiety disorder, unspecified: Secondary | ICD-10-CM | POA: Diagnosis not present

## 2021-11-29 DIAGNOSIS — F1120 Opioid dependence, uncomplicated: Secondary | ICD-10-CM | POA: Diagnosis not present

## 2021-11-29 DIAGNOSIS — E119 Type 2 diabetes mellitus without complications: Secondary | ICD-10-CM | POA: Diagnosis not present

## 2021-11-29 DIAGNOSIS — Z79899 Other long term (current) drug therapy: Secondary | ICD-10-CM | POA: Diagnosis not present

## 2021-11-29 DIAGNOSIS — F32A Depression, unspecified: Secondary | ICD-10-CM | POA: Diagnosis not present

## 2021-11-29 DIAGNOSIS — E7800 Pure hypercholesterolemia, unspecified: Secondary | ICD-10-CM | POA: Diagnosis not present

## 2021-12-06 DIAGNOSIS — Z993 Dependence on wheelchair: Secondary | ICD-10-CM | POA: Diagnosis not present

## 2021-12-06 DIAGNOSIS — F419 Anxiety disorder, unspecified: Secondary | ICD-10-CM | POA: Diagnosis not present

## 2021-12-06 DIAGNOSIS — Z7984 Long term (current) use of oral hypoglycemic drugs: Secondary | ICD-10-CM | POA: Diagnosis not present

## 2021-12-06 DIAGNOSIS — E7800 Pure hypercholesterolemia, unspecified: Secondary | ICD-10-CM | POA: Diagnosis not present

## 2021-12-06 DIAGNOSIS — E039 Hypothyroidism, unspecified: Secondary | ICD-10-CM | POA: Diagnosis not present

## 2021-12-06 DIAGNOSIS — M1990 Unspecified osteoarthritis, unspecified site: Secondary | ICD-10-CM | POA: Diagnosis not present

## 2021-12-06 DIAGNOSIS — Z87891 Personal history of nicotine dependence: Secondary | ICD-10-CM | POA: Diagnosis not present

## 2021-12-06 DIAGNOSIS — B9562 Methicillin resistant Staphylococcus aureus infection as the cause of diseases classified elsewhere: Secondary | ICD-10-CM | POA: Diagnosis not present

## 2021-12-06 DIAGNOSIS — I10 Essential (primary) hypertension: Secondary | ICD-10-CM | POA: Diagnosis not present

## 2021-12-06 DIAGNOSIS — K219 Gastro-esophageal reflux disease without esophagitis: Secondary | ICD-10-CM | POA: Diagnosis not present

## 2021-12-06 DIAGNOSIS — Z79899 Other long term (current) drug therapy: Secondary | ICD-10-CM | POA: Diagnosis not present

## 2021-12-06 DIAGNOSIS — Z9181 History of falling: Secondary | ICD-10-CM | POA: Diagnosis not present

## 2021-12-06 DIAGNOSIS — K2900 Acute gastritis without bleeding: Secondary | ICD-10-CM | POA: Diagnosis not present

## 2021-12-06 DIAGNOSIS — G8921 Chronic pain due to trauma: Secondary | ICD-10-CM | POA: Diagnosis not present

## 2021-12-06 DIAGNOSIS — E119 Type 2 diabetes mellitus without complications: Secondary | ICD-10-CM | POA: Diagnosis not present

## 2021-12-06 DIAGNOSIS — J690 Pneumonitis due to inhalation of food and vomit: Secondary | ICD-10-CM | POA: Diagnosis not present

## 2021-12-06 DIAGNOSIS — Z931 Gastrostomy status: Secondary | ICD-10-CM | POA: Diagnosis not present

## 2021-12-06 DIAGNOSIS — F1120 Opioid dependence, uncomplicated: Secondary | ICD-10-CM | POA: Diagnosis not present

## 2021-12-06 DIAGNOSIS — F32A Depression, unspecified: Secondary | ICD-10-CM | POA: Diagnosis not present

## 2021-12-10 DIAGNOSIS — F32A Depression, unspecified: Secondary | ICD-10-CM | POA: Diagnosis not present

## 2021-12-10 DIAGNOSIS — Z79899 Other long term (current) drug therapy: Secondary | ICD-10-CM | POA: Diagnosis not present

## 2021-12-10 DIAGNOSIS — Z993 Dependence on wheelchair: Secondary | ICD-10-CM | POA: Diagnosis not present

## 2021-12-10 DIAGNOSIS — B9562 Methicillin resistant Staphylococcus aureus infection as the cause of diseases classified elsewhere: Secondary | ICD-10-CM | POA: Diagnosis not present

## 2021-12-10 DIAGNOSIS — G8921 Chronic pain due to trauma: Secondary | ICD-10-CM | POA: Diagnosis not present

## 2021-12-10 DIAGNOSIS — I10 Essential (primary) hypertension: Secondary | ICD-10-CM | POA: Diagnosis not present

## 2021-12-10 DIAGNOSIS — E039 Hypothyroidism, unspecified: Secondary | ICD-10-CM | POA: Diagnosis not present

## 2021-12-10 DIAGNOSIS — F419 Anxiety disorder, unspecified: Secondary | ICD-10-CM | POA: Diagnosis not present

## 2021-12-10 DIAGNOSIS — K2900 Acute gastritis without bleeding: Secondary | ICD-10-CM | POA: Diagnosis not present

## 2021-12-10 DIAGNOSIS — Z931 Gastrostomy status: Secondary | ICD-10-CM | POA: Diagnosis not present

## 2021-12-10 DIAGNOSIS — Z9181 History of falling: Secondary | ICD-10-CM | POA: Diagnosis not present

## 2021-12-10 DIAGNOSIS — E119 Type 2 diabetes mellitus without complications: Secondary | ICD-10-CM | POA: Diagnosis not present

## 2021-12-10 DIAGNOSIS — K219 Gastro-esophageal reflux disease without esophagitis: Secondary | ICD-10-CM | POA: Diagnosis not present

## 2021-12-10 DIAGNOSIS — M1990 Unspecified osteoarthritis, unspecified site: Secondary | ICD-10-CM | POA: Diagnosis not present

## 2021-12-10 DIAGNOSIS — F1120 Opioid dependence, uncomplicated: Secondary | ICD-10-CM | POA: Diagnosis not present

## 2021-12-10 DIAGNOSIS — Z87891 Personal history of nicotine dependence: Secondary | ICD-10-CM | POA: Diagnosis not present

## 2021-12-10 DIAGNOSIS — E7800 Pure hypercholesterolemia, unspecified: Secondary | ICD-10-CM | POA: Diagnosis not present

## 2021-12-10 DIAGNOSIS — Z7984 Long term (current) use of oral hypoglycemic drugs: Secondary | ICD-10-CM | POA: Diagnosis not present

## 2021-12-10 DIAGNOSIS — J690 Pneumonitis due to inhalation of food and vomit: Secondary | ICD-10-CM | POA: Diagnosis not present

## 2021-12-12 DIAGNOSIS — K559 Vascular disorder of intestine, unspecified: Secondary | ICD-10-CM | POA: Diagnosis not present

## 2021-12-12 DIAGNOSIS — F331 Major depressive disorder, recurrent, moderate: Secondary | ICD-10-CM | POA: Diagnosis not present

## 2021-12-12 DIAGNOSIS — K219 Gastro-esophageal reflux disease without esophagitis: Secondary | ICD-10-CM | POA: Diagnosis not present

## 2021-12-12 DIAGNOSIS — E039 Hypothyroidism, unspecified: Secondary | ICD-10-CM | POA: Diagnosis not present

## 2021-12-12 DIAGNOSIS — M5441 Lumbago with sciatica, right side: Secondary | ICD-10-CM | POA: Diagnosis not present

## 2021-12-12 DIAGNOSIS — K5900 Constipation, unspecified: Secondary | ICD-10-CM | POA: Diagnosis not present

## 2021-12-12 DIAGNOSIS — E1140 Type 2 diabetes mellitus with diabetic neuropathy, unspecified: Secondary | ICD-10-CM | POA: Diagnosis not present

## 2021-12-12 DIAGNOSIS — E785 Hyperlipidemia, unspecified: Secondary | ICD-10-CM | POA: Diagnosis not present

## 2021-12-12 DIAGNOSIS — I6523 Occlusion and stenosis of bilateral carotid arteries: Secondary | ICD-10-CM | POA: Diagnosis not present

## 2021-12-12 DIAGNOSIS — G8929 Other chronic pain: Secondary | ICD-10-CM | POA: Diagnosis not present

## 2021-12-12 DIAGNOSIS — K2960 Other gastritis without bleeding: Secondary | ICD-10-CM | POA: Diagnosis not present

## 2021-12-12 DIAGNOSIS — J189 Pneumonia, unspecified organism: Secondary | ICD-10-CM | POA: Diagnosis not present

## 2021-12-12 DIAGNOSIS — M1991 Primary osteoarthritis, unspecified site: Secondary | ICD-10-CM | POA: Diagnosis not present

## 2021-12-12 DIAGNOSIS — M5442 Lumbago with sciatica, left side: Secondary | ICD-10-CM | POA: Diagnosis not present

## 2021-12-12 DIAGNOSIS — E113293 Type 2 diabetes mellitus with mild nonproliferative diabetic retinopathy without macular edema, bilateral: Secondary | ICD-10-CM | POA: Diagnosis not present

## 2021-12-13 DIAGNOSIS — Z87891 Personal history of nicotine dependence: Secondary | ICD-10-CM | POA: Diagnosis not present

## 2021-12-13 DIAGNOSIS — Z7984 Long term (current) use of oral hypoglycemic drugs: Secondary | ICD-10-CM | POA: Diagnosis not present

## 2021-12-13 DIAGNOSIS — M1990 Unspecified osteoarthritis, unspecified site: Secondary | ICD-10-CM | POA: Diagnosis not present

## 2021-12-13 DIAGNOSIS — G8921 Chronic pain due to trauma: Secondary | ICD-10-CM | POA: Diagnosis not present

## 2021-12-13 DIAGNOSIS — F419 Anxiety disorder, unspecified: Secondary | ICD-10-CM | POA: Diagnosis not present

## 2021-12-13 DIAGNOSIS — E039 Hypothyroidism, unspecified: Secondary | ICD-10-CM | POA: Diagnosis not present

## 2021-12-13 DIAGNOSIS — K219 Gastro-esophageal reflux disease without esophagitis: Secondary | ICD-10-CM | POA: Diagnosis not present

## 2021-12-13 DIAGNOSIS — Z931 Gastrostomy status: Secondary | ICD-10-CM | POA: Diagnosis not present

## 2021-12-13 DIAGNOSIS — Z9181 History of falling: Secondary | ICD-10-CM | POA: Diagnosis not present

## 2021-12-13 DIAGNOSIS — B9562 Methicillin resistant Staphylococcus aureus infection as the cause of diseases classified elsewhere: Secondary | ICD-10-CM | POA: Diagnosis not present

## 2021-12-13 DIAGNOSIS — E119 Type 2 diabetes mellitus without complications: Secondary | ICD-10-CM | POA: Diagnosis not present

## 2021-12-13 DIAGNOSIS — K2900 Acute gastritis without bleeding: Secondary | ICD-10-CM | POA: Diagnosis not present

## 2021-12-13 DIAGNOSIS — Z79899 Other long term (current) drug therapy: Secondary | ICD-10-CM | POA: Diagnosis not present

## 2021-12-13 DIAGNOSIS — F32A Depression, unspecified: Secondary | ICD-10-CM | POA: Diagnosis not present

## 2021-12-13 DIAGNOSIS — Z993 Dependence on wheelchair: Secondary | ICD-10-CM | POA: Diagnosis not present

## 2021-12-13 DIAGNOSIS — E7800 Pure hypercholesterolemia, unspecified: Secondary | ICD-10-CM | POA: Diagnosis not present

## 2021-12-13 DIAGNOSIS — I10 Essential (primary) hypertension: Secondary | ICD-10-CM | POA: Diagnosis not present

## 2021-12-13 DIAGNOSIS — F1120 Opioid dependence, uncomplicated: Secondary | ICD-10-CM | POA: Diagnosis not present

## 2021-12-13 DIAGNOSIS — J690 Pneumonitis due to inhalation of food and vomit: Secondary | ICD-10-CM | POA: Diagnosis not present

## 2021-12-16 DIAGNOSIS — I509 Heart failure, unspecified: Secondary | ICD-10-CM | POA: Diagnosis not present

## 2021-12-16 DIAGNOSIS — I4510 Unspecified right bundle-branch block: Secondary | ICD-10-CM | POA: Diagnosis not present

## 2021-12-16 DIAGNOSIS — J989 Respiratory disorder, unspecified: Secondary | ICD-10-CM | POA: Diagnosis not present

## 2021-12-16 DIAGNOSIS — R9431 Abnormal electrocardiogram [ECG] [EKG]: Secondary | ICD-10-CM | POA: Diagnosis not present

## 2021-12-17 DIAGNOSIS — Z87891 Personal history of nicotine dependence: Secondary | ICD-10-CM | POA: Diagnosis not present

## 2021-12-17 DIAGNOSIS — E1140 Type 2 diabetes mellitus with diabetic neuropathy, unspecified: Secondary | ICD-10-CM | POA: Diagnosis not present

## 2021-12-17 DIAGNOSIS — J301 Allergic rhinitis due to pollen: Secondary | ICD-10-CM | POA: Diagnosis not present

## 2021-12-17 DIAGNOSIS — J449 Chronic obstructive pulmonary disease, unspecified: Secondary | ICD-10-CM | POA: Diagnosis not present

## 2021-12-17 DIAGNOSIS — G2581 Restless legs syndrome: Secondary | ICD-10-CM | POA: Diagnosis not present

## 2021-12-17 DIAGNOSIS — R400 Somnolence: Secondary | ICD-10-CM | POA: Diagnosis not present

## 2021-12-17 DIAGNOSIS — R918 Other nonspecific abnormal finding of lung field: Secondary | ICD-10-CM | POA: Diagnosis not present

## 2021-12-17 DIAGNOSIS — G4737 Central sleep apnea in conditions classified elsewhere: Secondary | ICD-10-CM | POA: Diagnosis not present

## 2021-12-17 DIAGNOSIS — R5383 Other fatigue: Secondary | ICD-10-CM | POA: Diagnosis not present

## 2021-12-27 DIAGNOSIS — M1990 Unspecified osteoarthritis, unspecified site: Secondary | ICD-10-CM | POA: Diagnosis not present

## 2021-12-27 DIAGNOSIS — G9341 Metabolic encephalopathy: Secondary | ICD-10-CM | POA: Diagnosis not present

## 2021-12-27 DIAGNOSIS — F32A Depression, unspecified: Secondary | ICD-10-CM | POA: Diagnosis not present

## 2021-12-27 DIAGNOSIS — J690 Pneumonitis due to inhalation of food and vomit: Secondary | ICD-10-CM | POA: Diagnosis not present

## 2021-12-27 DIAGNOSIS — F17210 Nicotine dependence, cigarettes, uncomplicated: Secondary | ICD-10-CM | POA: Diagnosis not present

## 2021-12-27 DIAGNOSIS — E119 Type 2 diabetes mellitus without complications: Secondary | ICD-10-CM | POA: Diagnosis not present

## 2021-12-27 DIAGNOSIS — I82611 Acute embolism and thrombosis of superficial veins of right upper extremity: Secondary | ICD-10-CM | POA: Diagnosis not present

## 2021-12-27 DIAGNOSIS — Z452 Encounter for adjustment and management of vascular access device: Secondary | ICD-10-CM | POA: Diagnosis not present

## 2021-12-27 DIAGNOSIS — E876 Hypokalemia: Secondary | ICD-10-CM | POA: Diagnosis not present

## 2021-12-27 DIAGNOSIS — G894 Chronic pain syndrome: Secondary | ICD-10-CM | POA: Diagnosis not present

## 2021-12-27 DIAGNOSIS — Z86711 Personal history of pulmonary embolism: Secondary | ICD-10-CM | POA: Diagnosis not present

## 2021-12-27 DIAGNOSIS — J189 Pneumonia, unspecified organism: Secondary | ICD-10-CM | POA: Diagnosis not present

## 2021-12-27 DIAGNOSIS — J440 Chronic obstructive pulmonary disease with acute lower respiratory infection: Secondary | ICD-10-CM | POA: Diagnosis not present

## 2021-12-27 DIAGNOSIS — I4510 Unspecified right bundle-branch block: Secondary | ICD-10-CM | POA: Diagnosis not present

## 2021-12-27 DIAGNOSIS — A419 Sepsis, unspecified organism: Secondary | ICD-10-CM | POA: Diagnosis not present

## 2021-12-27 DIAGNOSIS — Z79899 Other long term (current) drug therapy: Secondary | ICD-10-CM | POA: Diagnosis not present

## 2021-12-27 DIAGNOSIS — B9562 Methicillin resistant Staphylococcus aureus infection as the cause of diseases classified elsewhere: Secondary | ICD-10-CM | POA: Diagnosis not present

## 2021-12-27 DIAGNOSIS — R9431 Abnormal electrocardiogram [ECG] [EKG]: Secondary | ICD-10-CM | POA: Diagnosis not present

## 2021-12-27 DIAGNOSIS — Z7984 Long term (current) use of oral hypoglycemic drugs: Secondary | ICD-10-CM | POA: Diagnosis not present

## 2021-12-27 DIAGNOSIS — K219 Gastro-esophageal reflux disease without esophagitis: Secondary | ICD-10-CM | POA: Diagnosis not present

## 2021-12-27 DIAGNOSIS — J9601 Acute respiratory failure with hypoxia: Secondary | ICD-10-CM | POA: Diagnosis not present

## 2021-12-27 DIAGNOSIS — I10 Essential (primary) hypertension: Secondary | ICD-10-CM | POA: Diagnosis not present

## 2021-12-27 DIAGNOSIS — J181 Lobar pneumonia, unspecified organism: Secondary | ICD-10-CM | POA: Diagnosis not present

## 2021-12-27 DIAGNOSIS — R0902 Hypoxemia: Secondary | ICD-10-CM | POA: Diagnosis not present

## 2021-12-27 DIAGNOSIS — J15212 Pneumonia due to Methicillin resistant Staphylococcus aureus: Secondary | ICD-10-CM | POA: Diagnosis not present

## 2021-12-27 DIAGNOSIS — R6510 Systemic inflammatory response syndrome (SIRS) of non-infectious origin without acute organ dysfunction: Secondary | ICD-10-CM | POA: Diagnosis not present

## 2021-12-27 DIAGNOSIS — E7800 Pure hypercholesterolemia, unspecified: Secondary | ICD-10-CM | POA: Diagnosis not present

## 2021-12-27 DIAGNOSIS — J90 Pleural effusion, not elsewhere classified: Secondary | ICD-10-CM | POA: Diagnosis not present

## 2021-12-27 DIAGNOSIS — N390 Urinary tract infection, site not specified: Secondary | ICD-10-CM | POA: Diagnosis not present

## 2021-12-27 DIAGNOSIS — I471 Supraventricular tachycardia: Secondary | ICD-10-CM | POA: Diagnosis not present

## 2021-12-27 DIAGNOSIS — E039 Hypothyroidism, unspecified: Secondary | ICD-10-CM | POA: Diagnosis not present

## 2021-12-27 DIAGNOSIS — I959 Hypotension, unspecified: Secondary | ICD-10-CM | POA: Diagnosis not present

## 2021-12-27 DIAGNOSIS — B9620 Unspecified Escherichia coli [E. coli] as the cause of diseases classified elsewhere: Secondary | ICD-10-CM | POA: Diagnosis not present

## 2021-12-28 DIAGNOSIS — J690 Pneumonitis due to inhalation of food and vomit: Secondary | ICD-10-CM | POA: Diagnosis not present

## 2021-12-28 DIAGNOSIS — A419 Sepsis, unspecified organism: Secondary | ICD-10-CM | POA: Diagnosis not present

## 2021-12-28 DIAGNOSIS — G9341 Metabolic encephalopathy: Secondary | ICD-10-CM | POA: Diagnosis not present

## 2021-12-29 DIAGNOSIS — A419 Sepsis, unspecified organism: Secondary | ICD-10-CM | POA: Diagnosis not present

## 2021-12-29 DIAGNOSIS — G9341 Metabolic encephalopathy: Secondary | ICD-10-CM | POA: Diagnosis not present

## 2021-12-29 DIAGNOSIS — J690 Pneumonitis due to inhalation of food and vomit: Secondary | ICD-10-CM | POA: Diagnosis not present

## 2021-12-30 DIAGNOSIS — J189 Pneumonia, unspecified organism: Secondary | ICD-10-CM | POA: Diagnosis not present

## 2021-12-30 DIAGNOSIS — J690 Pneumonitis due to inhalation of food and vomit: Secondary | ICD-10-CM | POA: Diagnosis not present

## 2021-12-30 DIAGNOSIS — G9341 Metabolic encephalopathy: Secondary | ICD-10-CM | POA: Diagnosis not present

## 2021-12-30 DIAGNOSIS — Z452 Encounter for adjustment and management of vascular access device: Secondary | ICD-10-CM | POA: Diagnosis not present

## 2021-12-30 DIAGNOSIS — A419 Sepsis, unspecified organism: Secondary | ICD-10-CM | POA: Diagnosis not present

## 2021-12-31 DIAGNOSIS — J690 Pneumonitis due to inhalation of food and vomit: Secondary | ICD-10-CM | POA: Diagnosis not present

## 2021-12-31 DIAGNOSIS — A419 Sepsis, unspecified organism: Secondary | ICD-10-CM | POA: Diagnosis not present

## 2021-12-31 DIAGNOSIS — G9341 Metabolic encephalopathy: Secondary | ICD-10-CM | POA: Diagnosis not present

## 2021-12-31 DIAGNOSIS — I82611 Acute embolism and thrombosis of superficial veins of right upper extremity: Secondary | ICD-10-CM | POA: Diagnosis not present

## 2021-12-31 DIAGNOSIS — B9562 Methicillin resistant Staphylococcus aureus infection as the cause of diseases classified elsewhere: Secondary | ICD-10-CM | POA: Diagnosis not present

## 2022-01-01 DIAGNOSIS — G9341 Metabolic encephalopathy: Secondary | ICD-10-CM | POA: Diagnosis not present

## 2022-01-01 DIAGNOSIS — Z452 Encounter for adjustment and management of vascular access device: Secondary | ICD-10-CM | POA: Diagnosis not present

## 2022-01-01 DIAGNOSIS — J189 Pneumonia, unspecified organism: Secondary | ICD-10-CM | POA: Diagnosis not present

## 2022-01-01 DIAGNOSIS — J690 Pneumonitis due to inhalation of food and vomit: Secondary | ICD-10-CM | POA: Diagnosis not present

## 2022-01-01 DIAGNOSIS — A419 Sepsis, unspecified organism: Secondary | ICD-10-CM | POA: Diagnosis not present

## 2022-01-02 DIAGNOSIS — G9341 Metabolic encephalopathy: Secondary | ICD-10-CM | POA: Diagnosis not present

## 2022-01-02 DIAGNOSIS — A419 Sepsis, unspecified organism: Secondary | ICD-10-CM | POA: Diagnosis not present

## 2022-01-02 DIAGNOSIS — J690 Pneumonitis due to inhalation of food and vomit: Secondary | ICD-10-CM | POA: Diagnosis not present

## 2022-01-03 DIAGNOSIS — A419 Sepsis, unspecified organism: Secondary | ICD-10-CM | POA: Diagnosis not present

## 2022-01-03 DIAGNOSIS — G9341 Metabolic encephalopathy: Secondary | ICD-10-CM | POA: Diagnosis not present

## 2022-01-03 DIAGNOSIS — J690 Pneumonitis due to inhalation of food and vomit: Secondary | ICD-10-CM | POA: Diagnosis not present

## 2022-01-04 DIAGNOSIS — G9341 Metabolic encephalopathy: Secondary | ICD-10-CM | POA: Diagnosis not present

## 2022-01-04 DIAGNOSIS — J690 Pneumonitis due to inhalation of food and vomit: Secondary | ICD-10-CM | POA: Diagnosis not present

## 2022-01-04 DIAGNOSIS — J90 Pleural effusion, not elsewhere classified: Secondary | ICD-10-CM | POA: Diagnosis not present

## 2022-01-04 DIAGNOSIS — A419 Sepsis, unspecified organism: Secondary | ICD-10-CM | POA: Diagnosis not present

## 2022-01-05 DIAGNOSIS — J690 Pneumonitis due to inhalation of food and vomit: Secondary | ICD-10-CM | POA: Diagnosis not present

## 2022-01-05 DIAGNOSIS — G9341 Metabolic encephalopathy: Secondary | ICD-10-CM | POA: Diagnosis not present

## 2022-01-05 DIAGNOSIS — A419 Sepsis, unspecified organism: Secondary | ICD-10-CM | POA: Diagnosis not present

## 2022-01-06 DIAGNOSIS — A419 Sepsis, unspecified organism: Secondary | ICD-10-CM | POA: Diagnosis not present

## 2022-01-06 DIAGNOSIS — G9341 Metabolic encephalopathy: Secondary | ICD-10-CM | POA: Diagnosis not present

## 2022-01-06 DIAGNOSIS — J690 Pneumonitis due to inhalation of food and vomit: Secondary | ICD-10-CM | POA: Diagnosis not present

## 2022-01-13 DIAGNOSIS — J189 Pneumonia, unspecified organism: Secondary | ICD-10-CM | POA: Diagnosis not present

## 2022-01-13 DIAGNOSIS — M1991 Primary osteoarthritis, unspecified site: Secondary | ICD-10-CM | POA: Diagnosis not present

## 2022-01-13 DIAGNOSIS — R262 Difficulty in walking, not elsewhere classified: Secondary | ICD-10-CM | POA: Diagnosis not present

## 2022-01-13 DIAGNOSIS — E039 Hypothyroidism, unspecified: Secondary | ICD-10-CM | POA: Diagnosis not present

## 2022-01-13 DIAGNOSIS — I6523 Occlusion and stenosis of bilateral carotid arteries: Secondary | ICD-10-CM | POA: Diagnosis not present

## 2022-01-13 DIAGNOSIS — E113293 Type 2 diabetes mellitus with mild nonproliferative diabetic retinopathy without macular edema, bilateral: Secondary | ICD-10-CM | POA: Diagnosis not present

## 2022-01-13 DIAGNOSIS — E785 Hyperlipidemia, unspecified: Secondary | ICD-10-CM | POA: Diagnosis not present

## 2022-01-13 DIAGNOSIS — N390 Urinary tract infection, site not specified: Secondary | ICD-10-CM | POA: Diagnosis not present

## 2022-01-13 DIAGNOSIS — M5442 Lumbago with sciatica, left side: Secondary | ICD-10-CM | POA: Diagnosis not present

## 2022-01-13 DIAGNOSIS — Z0000 Encounter for general adult medical examination without abnormal findings: Secondary | ICD-10-CM | POA: Diagnosis not present

## 2022-01-13 DIAGNOSIS — E1140 Type 2 diabetes mellitus with diabetic neuropathy, unspecified: Secondary | ICD-10-CM | POA: Diagnosis not present

## 2022-01-13 DIAGNOSIS — A499 Bacterial infection, unspecified: Secondary | ICD-10-CM | POA: Diagnosis not present

## 2022-01-14 DIAGNOSIS — J301 Allergic rhinitis due to pollen: Secondary | ICD-10-CM | POA: Diagnosis not present

## 2022-01-14 DIAGNOSIS — R5383 Other fatigue: Secondary | ICD-10-CM | POA: Diagnosis not present

## 2022-01-14 DIAGNOSIS — R400 Somnolence: Secondary | ICD-10-CM | POA: Diagnosis not present

## 2022-01-14 DIAGNOSIS — R918 Other nonspecific abnormal finding of lung field: Secondary | ICD-10-CM | POA: Diagnosis not present

## 2022-01-14 DIAGNOSIS — J449 Chronic obstructive pulmonary disease, unspecified: Secondary | ICD-10-CM | POA: Diagnosis not present

## 2022-01-14 DIAGNOSIS — Z87891 Personal history of nicotine dependence: Secondary | ICD-10-CM | POA: Diagnosis not present

## 2022-01-14 DIAGNOSIS — G2581 Restless legs syndrome: Secondary | ICD-10-CM | POA: Diagnosis not present

## 2022-01-14 DIAGNOSIS — G4737 Central sleep apnea in conditions classified elsewhere: Secondary | ICD-10-CM | POA: Diagnosis not present

## 2022-01-17 DIAGNOSIS — Z931 Gastrostomy status: Secondary | ICD-10-CM | POA: Diagnosis not present

## 2022-01-17 DIAGNOSIS — Z9181 History of falling: Secondary | ICD-10-CM | POA: Diagnosis not present

## 2022-01-17 DIAGNOSIS — F419 Anxiety disorder, unspecified: Secondary | ICD-10-CM | POA: Diagnosis not present

## 2022-01-17 DIAGNOSIS — K219 Gastro-esophageal reflux disease without esophagitis: Secondary | ICD-10-CM | POA: Diagnosis not present

## 2022-01-17 DIAGNOSIS — I10 Essential (primary) hypertension: Secondary | ICD-10-CM | POA: Diagnosis not present

## 2022-01-17 DIAGNOSIS — Z7984 Long term (current) use of oral hypoglycemic drugs: Secondary | ICD-10-CM | POA: Diagnosis not present

## 2022-01-17 DIAGNOSIS — K2900 Acute gastritis without bleeding: Secondary | ICD-10-CM | POA: Diagnosis not present

## 2022-01-17 DIAGNOSIS — Z993 Dependence on wheelchair: Secondary | ICD-10-CM | POA: Diagnosis not present

## 2022-01-17 DIAGNOSIS — E7800 Pure hypercholesterolemia, unspecified: Secondary | ICD-10-CM | POA: Diagnosis not present

## 2022-01-17 DIAGNOSIS — E119 Type 2 diabetes mellitus without complications: Secondary | ICD-10-CM | POA: Diagnosis not present

## 2022-01-17 DIAGNOSIS — F1120 Opioid dependence, uncomplicated: Secondary | ICD-10-CM | POA: Diagnosis not present

## 2022-01-17 DIAGNOSIS — M1990 Unspecified osteoarthritis, unspecified site: Secondary | ICD-10-CM | POA: Diagnosis not present

## 2022-01-17 DIAGNOSIS — B9562 Methicillin resistant Staphylococcus aureus infection as the cause of diseases classified elsewhere: Secondary | ICD-10-CM | POA: Diagnosis not present

## 2022-01-17 DIAGNOSIS — Z87891 Personal history of nicotine dependence: Secondary | ICD-10-CM | POA: Diagnosis not present

## 2022-01-17 DIAGNOSIS — J690 Pneumonitis due to inhalation of food and vomit: Secondary | ICD-10-CM | POA: Diagnosis not present

## 2022-01-17 DIAGNOSIS — G8921 Chronic pain due to trauma: Secondary | ICD-10-CM | POA: Diagnosis not present

## 2022-01-17 DIAGNOSIS — F32A Depression, unspecified: Secondary | ICD-10-CM | POA: Diagnosis not present

## 2022-01-17 DIAGNOSIS — E039 Hypothyroidism, unspecified: Secondary | ICD-10-CM | POA: Diagnosis not present

## 2022-01-17 DIAGNOSIS — Z79899 Other long term (current) drug therapy: Secondary | ICD-10-CM | POA: Diagnosis not present

## 2022-01-20 DIAGNOSIS — H353132 Nonexudative age-related macular degeneration, bilateral, intermediate dry stage: Secondary | ICD-10-CM | POA: Diagnosis not present

## 2022-01-24 DIAGNOSIS — I3139 Other pericardial effusion (noninflammatory): Secondary | ICD-10-CM | POA: Diagnosis not present

## 2022-01-24 DIAGNOSIS — J9811 Atelectasis: Secondary | ICD-10-CM | POA: Diagnosis not present

## 2022-01-24 DIAGNOSIS — I2510 Atherosclerotic heart disease of native coronary artery without angina pectoris: Secondary | ICD-10-CM | POA: Diagnosis not present

## 2022-01-24 DIAGNOSIS — G9341 Metabolic encephalopathy: Secondary | ICD-10-CM | POA: Diagnosis not present

## 2022-01-24 DIAGNOSIS — R6511 Systemic inflammatory response syndrome (SIRS) of non-infectious origin with acute organ dysfunction: Secondary | ICD-10-CM | POA: Diagnosis not present

## 2022-01-24 DIAGNOSIS — I700 Atherosclerosis of aorta: Secondary | ICD-10-CM | POA: Diagnosis not present

## 2022-01-24 DIAGNOSIS — R911 Solitary pulmonary nodule: Secondary | ICD-10-CM | POA: Diagnosis not present

## 2022-01-24 DIAGNOSIS — R918 Other nonspecific abnormal finding of lung field: Secondary | ICD-10-CM | POA: Diagnosis not present

## 2022-01-28 DIAGNOSIS — J301 Allergic rhinitis due to pollen: Secondary | ICD-10-CM | POA: Diagnosis not present

## 2022-01-28 DIAGNOSIS — R918 Other nonspecific abnormal finding of lung field: Secondary | ICD-10-CM | POA: Diagnosis not present

## 2022-01-28 DIAGNOSIS — G4737 Central sleep apnea in conditions classified elsewhere: Secondary | ICD-10-CM | POA: Diagnosis not present

## 2022-01-28 DIAGNOSIS — Z87891 Personal history of nicotine dependence: Secondary | ICD-10-CM | POA: Diagnosis not present

## 2022-01-28 DIAGNOSIS — J449 Chronic obstructive pulmonary disease, unspecified: Secondary | ICD-10-CM | POA: Diagnosis not present

## 2022-01-28 DIAGNOSIS — R5383 Other fatigue: Secondary | ICD-10-CM | POA: Diagnosis not present

## 2022-01-28 DIAGNOSIS — R400 Somnolence: Secondary | ICD-10-CM | POA: Diagnosis not present

## 2022-01-28 DIAGNOSIS — G2581 Restless legs syndrome: Secondary | ICD-10-CM | POA: Diagnosis not present

## 2022-02-05 DIAGNOSIS — J449 Chronic obstructive pulmonary disease, unspecified: Secondary | ICD-10-CM | POA: Diagnosis not present

## 2022-02-05 DIAGNOSIS — M2170 Unequal limb length (acquired), unspecified site: Secondary | ICD-10-CM | POA: Diagnosis not present

## 2022-02-05 DIAGNOSIS — K219 Gastro-esophageal reflux disease without esophagitis: Secondary | ICD-10-CM | POA: Diagnosis not present

## 2022-02-05 DIAGNOSIS — F339 Major depressive disorder, recurrent, unspecified: Secondary | ICD-10-CM | POA: Diagnosis not present

## 2022-02-05 DIAGNOSIS — Z7984 Long term (current) use of oral hypoglycemic drugs: Secondary | ICD-10-CM | POA: Diagnosis not present

## 2022-02-05 DIAGNOSIS — E119 Type 2 diabetes mellitus without complications: Secondary | ICD-10-CM | POA: Diagnosis not present

## 2022-02-05 DIAGNOSIS — Z7951 Long term (current) use of inhaled steroids: Secondary | ICD-10-CM | POA: Diagnosis not present

## 2022-02-05 DIAGNOSIS — Z79890 Hormone replacement therapy (postmenopausal): Secondary | ICD-10-CM | POA: Diagnosis not present

## 2022-02-05 DIAGNOSIS — E785 Hyperlipidemia, unspecified: Secondary | ICD-10-CM | POA: Diagnosis not present

## 2022-02-05 DIAGNOSIS — D692 Other nonthrombocytopenic purpura: Secondary | ICD-10-CM | POA: Diagnosis not present

## 2022-02-05 DIAGNOSIS — E039 Hypothyroidism, unspecified: Secondary | ICD-10-CM | POA: Diagnosis not present

## 2022-02-06 DIAGNOSIS — K219 Gastro-esophageal reflux disease without esophagitis: Secondary | ICD-10-CM | POA: Diagnosis not present

## 2022-02-06 DIAGNOSIS — K2900 Acute gastritis without bleeding: Secondary | ICD-10-CM | POA: Diagnosis not present

## 2022-02-06 DIAGNOSIS — I10 Essential (primary) hypertension: Secondary | ICD-10-CM | POA: Diagnosis not present

## 2022-02-06 DIAGNOSIS — F32A Depression, unspecified: Secondary | ICD-10-CM | POA: Diagnosis not present

## 2022-02-06 DIAGNOSIS — F419 Anxiety disorder, unspecified: Secondary | ICD-10-CM | POA: Diagnosis not present

## 2022-02-06 DIAGNOSIS — F1120 Opioid dependence, uncomplicated: Secondary | ICD-10-CM | POA: Diagnosis not present

## 2022-02-06 DIAGNOSIS — E7800 Pure hypercholesterolemia, unspecified: Secondary | ICD-10-CM | POA: Diagnosis not present

## 2022-02-06 DIAGNOSIS — Z993 Dependence on wheelchair: Secondary | ICD-10-CM | POA: Diagnosis not present

## 2022-02-06 DIAGNOSIS — Z79899 Other long term (current) drug therapy: Secondary | ICD-10-CM | POA: Diagnosis not present

## 2022-02-06 DIAGNOSIS — E039 Hypothyroidism, unspecified: Secondary | ICD-10-CM | POA: Diagnosis not present

## 2022-02-06 DIAGNOSIS — M1990 Unspecified osteoarthritis, unspecified site: Secondary | ICD-10-CM | POA: Diagnosis not present

## 2022-02-06 DIAGNOSIS — Z9181 History of falling: Secondary | ICD-10-CM | POA: Diagnosis not present

## 2022-02-06 DIAGNOSIS — J690 Pneumonitis due to inhalation of food and vomit: Secondary | ICD-10-CM | POA: Diagnosis not present

## 2022-02-06 DIAGNOSIS — B9562 Methicillin resistant Staphylococcus aureus infection as the cause of diseases classified elsewhere: Secondary | ICD-10-CM | POA: Diagnosis not present

## 2022-02-06 DIAGNOSIS — Z931 Gastrostomy status: Secondary | ICD-10-CM | POA: Diagnosis not present

## 2022-02-06 DIAGNOSIS — Z7984 Long term (current) use of oral hypoglycemic drugs: Secondary | ICD-10-CM | POA: Diagnosis not present

## 2022-02-06 DIAGNOSIS — G8921 Chronic pain due to trauma: Secondary | ICD-10-CM | POA: Diagnosis not present

## 2022-02-06 DIAGNOSIS — Z87891 Personal history of nicotine dependence: Secondary | ICD-10-CM | POA: Diagnosis not present

## 2022-02-06 DIAGNOSIS — E119 Type 2 diabetes mellitus without complications: Secondary | ICD-10-CM | POA: Diagnosis not present

## 2022-02-07 DIAGNOSIS — F419 Anxiety disorder, unspecified: Secondary | ICD-10-CM | POA: Diagnosis not present

## 2022-02-07 DIAGNOSIS — F1120 Opioid dependence, uncomplicated: Secondary | ICD-10-CM | POA: Diagnosis not present

## 2022-02-07 DIAGNOSIS — Z993 Dependence on wheelchair: Secondary | ICD-10-CM | POA: Diagnosis not present

## 2022-02-07 DIAGNOSIS — Z87891 Personal history of nicotine dependence: Secondary | ICD-10-CM | POA: Diagnosis not present

## 2022-02-07 DIAGNOSIS — M1990 Unspecified osteoarthritis, unspecified site: Secondary | ICD-10-CM | POA: Diagnosis not present

## 2022-02-07 DIAGNOSIS — Z7984 Long term (current) use of oral hypoglycemic drugs: Secondary | ICD-10-CM | POA: Diagnosis not present

## 2022-02-07 DIAGNOSIS — K2900 Acute gastritis without bleeding: Secondary | ICD-10-CM | POA: Diagnosis not present

## 2022-02-07 DIAGNOSIS — Z931 Gastrostomy status: Secondary | ICD-10-CM | POA: Diagnosis not present

## 2022-02-07 DIAGNOSIS — E039 Hypothyroidism, unspecified: Secondary | ICD-10-CM | POA: Diagnosis not present

## 2022-02-07 DIAGNOSIS — G8921 Chronic pain due to trauma: Secondary | ICD-10-CM | POA: Diagnosis not present

## 2022-02-07 DIAGNOSIS — Z9181 History of falling: Secondary | ICD-10-CM | POA: Diagnosis not present

## 2022-02-07 DIAGNOSIS — B9562 Methicillin resistant Staphylococcus aureus infection as the cause of diseases classified elsewhere: Secondary | ICD-10-CM | POA: Diagnosis not present

## 2022-02-07 DIAGNOSIS — J690 Pneumonitis due to inhalation of food and vomit: Secondary | ICD-10-CM | POA: Diagnosis not present

## 2022-02-07 DIAGNOSIS — E7800 Pure hypercholesterolemia, unspecified: Secondary | ICD-10-CM | POA: Diagnosis not present

## 2022-02-07 DIAGNOSIS — Z79899 Other long term (current) drug therapy: Secondary | ICD-10-CM | POA: Diagnosis not present

## 2022-02-07 DIAGNOSIS — I10 Essential (primary) hypertension: Secondary | ICD-10-CM | POA: Diagnosis not present

## 2022-02-07 DIAGNOSIS — E119 Type 2 diabetes mellitus without complications: Secondary | ICD-10-CM | POA: Diagnosis not present

## 2022-02-07 DIAGNOSIS — F32A Depression, unspecified: Secondary | ICD-10-CM | POA: Diagnosis not present

## 2022-02-07 DIAGNOSIS — K219 Gastro-esophageal reflux disease without esophagitis: Secondary | ICD-10-CM | POA: Diagnosis not present

## 2022-02-10 DIAGNOSIS — K219 Gastro-esophageal reflux disease without esophagitis: Secondary | ICD-10-CM | POA: Diagnosis not present

## 2022-02-10 DIAGNOSIS — K559 Vascular disorder of intestine, unspecified: Secondary | ICD-10-CM | POA: Diagnosis not present

## 2022-02-12 DIAGNOSIS — F419 Anxiety disorder, unspecified: Secondary | ICD-10-CM | POA: Diagnosis not present

## 2022-02-12 DIAGNOSIS — F32A Depression, unspecified: Secondary | ICD-10-CM | POA: Diagnosis not present

## 2022-02-12 DIAGNOSIS — Z87891 Personal history of nicotine dependence: Secondary | ICD-10-CM | POA: Diagnosis not present

## 2022-02-12 DIAGNOSIS — Z931 Gastrostomy status: Secondary | ICD-10-CM | POA: Diagnosis not present

## 2022-02-12 DIAGNOSIS — K219 Gastro-esophageal reflux disease without esophagitis: Secondary | ICD-10-CM | POA: Diagnosis not present

## 2022-02-12 DIAGNOSIS — Z9181 History of falling: Secondary | ICD-10-CM | POA: Diagnosis not present

## 2022-02-12 DIAGNOSIS — J690 Pneumonitis due to inhalation of food and vomit: Secondary | ICD-10-CM | POA: Diagnosis not present

## 2022-02-12 DIAGNOSIS — Z993 Dependence on wheelchair: Secondary | ICD-10-CM | POA: Diagnosis not present

## 2022-02-12 DIAGNOSIS — Z79899 Other long term (current) drug therapy: Secondary | ICD-10-CM | POA: Diagnosis not present

## 2022-02-12 DIAGNOSIS — Z7984 Long term (current) use of oral hypoglycemic drugs: Secondary | ICD-10-CM | POA: Diagnosis not present

## 2022-02-12 DIAGNOSIS — E119 Type 2 diabetes mellitus without complications: Secondary | ICD-10-CM | POA: Diagnosis not present

## 2022-02-12 DIAGNOSIS — K2900 Acute gastritis without bleeding: Secondary | ICD-10-CM | POA: Diagnosis not present

## 2022-02-12 DIAGNOSIS — G8921 Chronic pain due to trauma: Secondary | ICD-10-CM | POA: Diagnosis not present

## 2022-02-12 DIAGNOSIS — M1990 Unspecified osteoarthritis, unspecified site: Secondary | ICD-10-CM | POA: Diagnosis not present

## 2022-02-12 DIAGNOSIS — I10 Essential (primary) hypertension: Secondary | ICD-10-CM | POA: Diagnosis not present

## 2022-02-12 DIAGNOSIS — B9562 Methicillin resistant Staphylococcus aureus infection as the cause of diseases classified elsewhere: Secondary | ICD-10-CM | POA: Diagnosis not present

## 2022-02-12 DIAGNOSIS — F1120 Opioid dependence, uncomplicated: Secondary | ICD-10-CM | POA: Diagnosis not present

## 2022-02-12 DIAGNOSIS — E039 Hypothyroidism, unspecified: Secondary | ICD-10-CM | POA: Diagnosis not present

## 2022-02-12 DIAGNOSIS — E7800 Pure hypercholesterolemia, unspecified: Secondary | ICD-10-CM | POA: Diagnosis not present

## 2022-02-14 DIAGNOSIS — I10 Essential (primary) hypertension: Secondary | ICD-10-CM | POA: Diagnosis not present

## 2022-02-14 DIAGNOSIS — Z87891 Personal history of nicotine dependence: Secondary | ICD-10-CM | POA: Diagnosis not present

## 2022-02-14 DIAGNOSIS — K2900 Acute gastritis without bleeding: Secondary | ICD-10-CM | POA: Diagnosis not present

## 2022-02-14 DIAGNOSIS — F32A Depression, unspecified: Secondary | ICD-10-CM | POA: Diagnosis not present

## 2022-02-14 DIAGNOSIS — I4510 Unspecified right bundle-branch block: Secondary | ICD-10-CM | POA: Diagnosis not present

## 2022-02-14 DIAGNOSIS — Z993 Dependence on wheelchair: Secondary | ICD-10-CM | POA: Diagnosis not present

## 2022-02-14 DIAGNOSIS — G8921 Chronic pain due to trauma: Secondary | ICD-10-CM | POA: Diagnosis not present

## 2022-02-14 DIAGNOSIS — F1120 Opioid dependence, uncomplicated: Secondary | ICD-10-CM | POA: Diagnosis not present

## 2022-02-14 DIAGNOSIS — Z7984 Long term (current) use of oral hypoglycemic drugs: Secondary | ICD-10-CM | POA: Diagnosis not present

## 2022-02-14 DIAGNOSIS — E039 Hypothyroidism, unspecified: Secondary | ICD-10-CM | POA: Diagnosis not present

## 2022-02-14 DIAGNOSIS — E7849 Other hyperlipidemia: Secondary | ICD-10-CM | POA: Diagnosis not present

## 2022-02-14 DIAGNOSIS — I340 Nonrheumatic mitral (valve) insufficiency: Secondary | ICD-10-CM | POA: Diagnosis not present

## 2022-02-14 DIAGNOSIS — I6529 Occlusion and stenosis of unspecified carotid artery: Secondary | ICD-10-CM | POA: Diagnosis not present

## 2022-02-14 DIAGNOSIS — J690 Pneumonitis due to inhalation of food and vomit: Secondary | ICD-10-CM | POA: Diagnosis not present

## 2022-02-14 DIAGNOSIS — J989 Respiratory disorder, unspecified: Secondary | ICD-10-CM | POA: Diagnosis not present

## 2022-02-14 DIAGNOSIS — E119 Type 2 diabetes mellitus without complications: Secondary | ICD-10-CM | POA: Diagnosis not present

## 2022-02-14 DIAGNOSIS — Z931 Gastrostomy status: Secondary | ICD-10-CM | POA: Diagnosis not present

## 2022-02-14 DIAGNOSIS — Z79899 Other long term (current) drug therapy: Secondary | ICD-10-CM | POA: Diagnosis not present

## 2022-02-14 DIAGNOSIS — B9562 Methicillin resistant Staphylococcus aureus infection as the cause of diseases classified elsewhere: Secondary | ICD-10-CM | POA: Diagnosis not present

## 2022-02-14 DIAGNOSIS — E7800 Pure hypercholesterolemia, unspecified: Secondary | ICD-10-CM | POA: Diagnosis not present

## 2022-02-14 DIAGNOSIS — M1990 Unspecified osteoarthritis, unspecified site: Secondary | ICD-10-CM | POA: Diagnosis not present

## 2022-02-14 DIAGNOSIS — F419 Anxiety disorder, unspecified: Secondary | ICD-10-CM | POA: Diagnosis not present

## 2022-02-14 DIAGNOSIS — Z9181 History of falling: Secondary | ICD-10-CM | POA: Diagnosis not present

## 2022-02-14 DIAGNOSIS — K219 Gastro-esophageal reflux disease without esophagitis: Secondary | ICD-10-CM | POA: Diagnosis not present

## 2022-02-18 DIAGNOSIS — E038 Other specified hypothyroidism: Secondary | ICD-10-CM | POA: Diagnosis not present

## 2022-02-18 DIAGNOSIS — E1169 Type 2 diabetes mellitus with other specified complication: Secondary | ICD-10-CM | POA: Diagnosis not present

## 2022-02-18 DIAGNOSIS — E785 Hyperlipidemia, unspecified: Secondary | ICD-10-CM | POA: Diagnosis not present

## 2022-02-18 DIAGNOSIS — E1142 Type 2 diabetes mellitus with diabetic polyneuropathy: Secondary | ICD-10-CM | POA: Diagnosis not present

## 2022-02-18 DIAGNOSIS — J439 Emphysema, unspecified: Secondary | ICD-10-CM | POA: Diagnosis not present

## 2022-02-18 DIAGNOSIS — G63 Polyneuropathy in diseases classified elsewhere: Secondary | ICD-10-CM | POA: Diagnosis not present

## 2022-02-18 DIAGNOSIS — F419 Anxiety disorder, unspecified: Secondary | ICD-10-CM | POA: Diagnosis not present

## 2022-02-18 DIAGNOSIS — G4700 Insomnia, unspecified: Secondary | ICD-10-CM | POA: Diagnosis not present

## 2022-02-18 DIAGNOSIS — E039 Hypothyroidism, unspecified: Secondary | ICD-10-CM | POA: Diagnosis not present

## 2022-02-18 DIAGNOSIS — F1120 Opioid dependence, uncomplicated: Secondary | ICD-10-CM | POA: Diagnosis not present

## 2022-02-18 DIAGNOSIS — G620 Drug-induced polyneuropathy: Secondary | ICD-10-CM | POA: Diagnosis not present

## 2022-02-18 DIAGNOSIS — F3342 Major depressive disorder, recurrent, in full remission: Secondary | ICD-10-CM | POA: Diagnosis not present

## 2022-02-21 DIAGNOSIS — J690 Pneumonitis due to inhalation of food and vomit: Secondary | ICD-10-CM | POA: Diagnosis not present

## 2022-02-21 DIAGNOSIS — Z931 Gastrostomy status: Secondary | ICD-10-CM | POA: Diagnosis not present

## 2022-02-21 DIAGNOSIS — G8921 Chronic pain due to trauma: Secondary | ICD-10-CM | POA: Diagnosis not present

## 2022-02-21 DIAGNOSIS — B9562 Methicillin resistant Staphylococcus aureus infection as the cause of diseases classified elsewhere: Secondary | ICD-10-CM | POA: Diagnosis not present

## 2022-02-21 DIAGNOSIS — M1990 Unspecified osteoarthritis, unspecified site: Secondary | ICD-10-CM | POA: Diagnosis not present

## 2022-02-21 DIAGNOSIS — Z87891 Personal history of nicotine dependence: Secondary | ICD-10-CM | POA: Diagnosis not present

## 2022-02-21 DIAGNOSIS — E039 Hypothyroidism, unspecified: Secondary | ICD-10-CM | POA: Diagnosis not present

## 2022-02-21 DIAGNOSIS — E7800 Pure hypercholesterolemia, unspecified: Secondary | ICD-10-CM | POA: Diagnosis not present

## 2022-02-21 DIAGNOSIS — Z9181 History of falling: Secondary | ICD-10-CM | POA: Diagnosis not present

## 2022-02-21 DIAGNOSIS — F32A Depression, unspecified: Secondary | ICD-10-CM | POA: Diagnosis not present

## 2022-02-21 DIAGNOSIS — K2900 Acute gastritis without bleeding: Secondary | ICD-10-CM | POA: Diagnosis not present

## 2022-02-21 DIAGNOSIS — Z993 Dependence on wheelchair: Secondary | ICD-10-CM | POA: Diagnosis not present

## 2022-02-21 DIAGNOSIS — Z7984 Long term (current) use of oral hypoglycemic drugs: Secondary | ICD-10-CM | POA: Diagnosis not present

## 2022-02-21 DIAGNOSIS — F419 Anxiety disorder, unspecified: Secondary | ICD-10-CM | POA: Diagnosis not present

## 2022-02-21 DIAGNOSIS — F1120 Opioid dependence, uncomplicated: Secondary | ICD-10-CM | POA: Diagnosis not present

## 2022-02-21 DIAGNOSIS — I10 Essential (primary) hypertension: Secondary | ICD-10-CM | POA: Diagnosis not present

## 2022-02-21 DIAGNOSIS — E119 Type 2 diabetes mellitus without complications: Secondary | ICD-10-CM | POA: Diagnosis not present

## 2022-02-21 DIAGNOSIS — K219 Gastro-esophageal reflux disease without esophagitis: Secondary | ICD-10-CM | POA: Diagnosis not present

## 2022-02-21 DIAGNOSIS — Z79899 Other long term (current) drug therapy: Secondary | ICD-10-CM | POA: Diagnosis not present

## 2022-02-26 DIAGNOSIS — R5383 Other fatigue: Secondary | ICD-10-CM | POA: Diagnosis not present

## 2022-02-26 DIAGNOSIS — G4737 Central sleep apnea in conditions classified elsewhere: Secondary | ICD-10-CM | POA: Diagnosis not present

## 2022-02-28 DIAGNOSIS — F32A Depression, unspecified: Secondary | ICD-10-CM | POA: Diagnosis not present

## 2022-02-28 DIAGNOSIS — K219 Gastro-esophageal reflux disease without esophagitis: Secondary | ICD-10-CM | POA: Diagnosis not present

## 2022-02-28 DIAGNOSIS — Z993 Dependence on wheelchair: Secondary | ICD-10-CM | POA: Diagnosis not present

## 2022-02-28 DIAGNOSIS — B9562 Methicillin resistant Staphylococcus aureus infection as the cause of diseases classified elsewhere: Secondary | ICD-10-CM | POA: Diagnosis not present

## 2022-02-28 DIAGNOSIS — I10 Essential (primary) hypertension: Secondary | ICD-10-CM | POA: Diagnosis not present

## 2022-02-28 DIAGNOSIS — F1120 Opioid dependence, uncomplicated: Secondary | ICD-10-CM | POA: Diagnosis not present

## 2022-02-28 DIAGNOSIS — Z931 Gastrostomy status: Secondary | ICD-10-CM | POA: Diagnosis not present

## 2022-02-28 DIAGNOSIS — E7800 Pure hypercholesterolemia, unspecified: Secondary | ICD-10-CM | POA: Diagnosis not present

## 2022-02-28 DIAGNOSIS — E039 Hypothyroidism, unspecified: Secondary | ICD-10-CM | POA: Diagnosis not present

## 2022-02-28 DIAGNOSIS — G8921 Chronic pain due to trauma: Secondary | ICD-10-CM | POA: Diagnosis not present

## 2022-02-28 DIAGNOSIS — M1990 Unspecified osteoarthritis, unspecified site: Secondary | ICD-10-CM | POA: Diagnosis not present

## 2022-02-28 DIAGNOSIS — F419 Anxiety disorder, unspecified: Secondary | ICD-10-CM | POA: Diagnosis not present

## 2022-02-28 DIAGNOSIS — J690 Pneumonitis due to inhalation of food and vomit: Secondary | ICD-10-CM | POA: Diagnosis not present

## 2022-02-28 DIAGNOSIS — Z7984 Long term (current) use of oral hypoglycemic drugs: Secondary | ICD-10-CM | POA: Diagnosis not present

## 2022-02-28 DIAGNOSIS — Z79899 Other long term (current) drug therapy: Secondary | ICD-10-CM | POA: Diagnosis not present

## 2022-02-28 DIAGNOSIS — Z9181 History of falling: Secondary | ICD-10-CM | POA: Diagnosis not present

## 2022-02-28 DIAGNOSIS — K2900 Acute gastritis without bleeding: Secondary | ICD-10-CM | POA: Diagnosis not present

## 2022-02-28 DIAGNOSIS — E119 Type 2 diabetes mellitus without complications: Secondary | ICD-10-CM | POA: Diagnosis not present

## 2022-02-28 DIAGNOSIS — Z87891 Personal history of nicotine dependence: Secondary | ICD-10-CM | POA: Diagnosis not present

## 2022-03-03 DIAGNOSIS — R062 Wheezing: Secondary | ICD-10-CM | POA: Diagnosis not present

## 2022-03-03 DIAGNOSIS — J309 Allergic rhinitis, unspecified: Secondary | ICD-10-CM | POA: Diagnosis not present

## 2022-03-03 DIAGNOSIS — R5383 Other fatigue: Secondary | ICD-10-CM | POA: Diagnosis not present

## 2022-03-03 DIAGNOSIS — G4731 Primary central sleep apnea: Secondary | ICD-10-CM | POA: Diagnosis not present

## 2022-03-03 DIAGNOSIS — R918 Other nonspecific abnormal finding of lung field: Secondary | ICD-10-CM | POA: Diagnosis not present

## 2022-03-03 DIAGNOSIS — I4510 Unspecified right bundle-branch block: Secondary | ICD-10-CM | POA: Diagnosis not present

## 2022-03-03 DIAGNOSIS — R0602 Shortness of breath: Secondary | ICD-10-CM | POA: Diagnosis not present

## 2022-03-03 DIAGNOSIS — J479 Bronchiectasis, uncomplicated: Secondary | ICD-10-CM | POA: Diagnosis not present

## 2022-03-03 DIAGNOSIS — R400 Somnolence: Secondary | ICD-10-CM | POA: Diagnosis not present

## 2022-03-04 DIAGNOSIS — I10 Essential (primary) hypertension: Secondary | ICD-10-CM | POA: Diagnosis not present

## 2022-03-04 DIAGNOSIS — E039 Hypothyroidism, unspecified: Secondary | ICD-10-CM | POA: Diagnosis not present

## 2022-03-04 DIAGNOSIS — Z993 Dependence on wheelchair: Secondary | ICD-10-CM | POA: Diagnosis not present

## 2022-03-04 DIAGNOSIS — Z87891 Personal history of nicotine dependence: Secondary | ICD-10-CM | POA: Diagnosis not present

## 2022-03-04 DIAGNOSIS — F419 Anxiety disorder, unspecified: Secondary | ICD-10-CM | POA: Diagnosis not present

## 2022-03-04 DIAGNOSIS — Z931 Gastrostomy status: Secondary | ICD-10-CM | POA: Diagnosis not present

## 2022-03-04 DIAGNOSIS — G8921 Chronic pain due to trauma: Secondary | ICD-10-CM | POA: Diagnosis not present

## 2022-03-04 DIAGNOSIS — M1990 Unspecified osteoarthritis, unspecified site: Secondary | ICD-10-CM | POA: Diagnosis not present

## 2022-03-04 DIAGNOSIS — F1120 Opioid dependence, uncomplicated: Secondary | ICD-10-CM | POA: Diagnosis not present

## 2022-03-04 DIAGNOSIS — E7800 Pure hypercholesterolemia, unspecified: Secondary | ICD-10-CM | POA: Diagnosis not present

## 2022-03-04 DIAGNOSIS — E119 Type 2 diabetes mellitus without complications: Secondary | ICD-10-CM | POA: Diagnosis not present

## 2022-03-04 DIAGNOSIS — K2900 Acute gastritis without bleeding: Secondary | ICD-10-CM | POA: Diagnosis not present

## 2022-03-04 DIAGNOSIS — Z79899 Other long term (current) drug therapy: Secondary | ICD-10-CM | POA: Diagnosis not present

## 2022-03-04 DIAGNOSIS — K219 Gastro-esophageal reflux disease without esophagitis: Secondary | ICD-10-CM | POA: Diagnosis not present

## 2022-03-04 DIAGNOSIS — F32A Depression, unspecified: Secondary | ICD-10-CM | POA: Diagnosis not present

## 2022-03-04 DIAGNOSIS — B9562 Methicillin resistant Staphylococcus aureus infection as the cause of diseases classified elsewhere: Secondary | ICD-10-CM | POA: Diagnosis not present

## 2022-03-04 DIAGNOSIS — Z7984 Long term (current) use of oral hypoglycemic drugs: Secondary | ICD-10-CM | POA: Diagnosis not present

## 2022-03-04 DIAGNOSIS — Z9181 History of falling: Secondary | ICD-10-CM | POA: Diagnosis not present

## 2022-03-04 DIAGNOSIS — J690 Pneumonitis due to inhalation of food and vomit: Secondary | ICD-10-CM | POA: Diagnosis not present

## 2022-03-06 DIAGNOSIS — H43812 Vitreous degeneration, left eye: Secondary | ICD-10-CM | POA: Diagnosis not present

## 2022-03-06 DIAGNOSIS — H26491 Other secondary cataract, right eye: Secondary | ICD-10-CM | POA: Diagnosis not present

## 2022-03-06 DIAGNOSIS — H3589 Other specified retinal disorders: Secondary | ICD-10-CM | POA: Diagnosis not present

## 2022-03-06 DIAGNOSIS — H35372 Puckering of macula, left eye: Secondary | ICD-10-CM | POA: Diagnosis not present

## 2022-03-07 DIAGNOSIS — Z7984 Long term (current) use of oral hypoglycemic drugs: Secondary | ICD-10-CM | POA: Diagnosis not present

## 2022-03-07 DIAGNOSIS — E119 Type 2 diabetes mellitus without complications: Secondary | ICD-10-CM | POA: Diagnosis not present

## 2022-03-07 DIAGNOSIS — F32A Depression, unspecified: Secondary | ICD-10-CM | POA: Diagnosis not present

## 2022-03-07 DIAGNOSIS — Z87891 Personal history of nicotine dependence: Secondary | ICD-10-CM | POA: Diagnosis not present

## 2022-03-07 DIAGNOSIS — F1120 Opioid dependence, uncomplicated: Secondary | ICD-10-CM | POA: Diagnosis not present

## 2022-03-07 DIAGNOSIS — I10 Essential (primary) hypertension: Secondary | ICD-10-CM | POA: Diagnosis not present

## 2022-03-07 DIAGNOSIS — J690 Pneumonitis due to inhalation of food and vomit: Secondary | ICD-10-CM | POA: Diagnosis not present

## 2022-03-07 DIAGNOSIS — E7800 Pure hypercholesterolemia, unspecified: Secondary | ICD-10-CM | POA: Diagnosis not present

## 2022-03-07 DIAGNOSIS — E039 Hypothyroidism, unspecified: Secondary | ICD-10-CM | POA: Diagnosis not present

## 2022-03-07 DIAGNOSIS — K2900 Acute gastritis without bleeding: Secondary | ICD-10-CM | POA: Diagnosis not present

## 2022-03-07 DIAGNOSIS — B9562 Methicillin resistant Staphylococcus aureus infection as the cause of diseases classified elsewhere: Secondary | ICD-10-CM | POA: Diagnosis not present

## 2022-03-07 DIAGNOSIS — Z993 Dependence on wheelchair: Secondary | ICD-10-CM | POA: Diagnosis not present

## 2022-03-07 DIAGNOSIS — Z79899 Other long term (current) drug therapy: Secondary | ICD-10-CM | POA: Diagnosis not present

## 2022-03-07 DIAGNOSIS — F419 Anxiety disorder, unspecified: Secondary | ICD-10-CM | POA: Diagnosis not present

## 2022-03-07 DIAGNOSIS — G8921 Chronic pain due to trauma: Secondary | ICD-10-CM | POA: Diagnosis not present

## 2022-03-07 DIAGNOSIS — Z9181 History of falling: Secondary | ICD-10-CM | POA: Diagnosis not present

## 2022-03-07 DIAGNOSIS — Z931 Gastrostomy status: Secondary | ICD-10-CM | POA: Diagnosis not present

## 2022-03-07 DIAGNOSIS — M1990 Unspecified osteoarthritis, unspecified site: Secondary | ICD-10-CM | POA: Diagnosis not present

## 2022-03-07 DIAGNOSIS — K219 Gastro-esophageal reflux disease without esophagitis: Secondary | ICD-10-CM | POA: Diagnosis not present

## 2022-03-10 DIAGNOSIS — R400 Somnolence: Secondary | ICD-10-CM | POA: Diagnosis not present

## 2022-03-10 DIAGNOSIS — R5383 Other fatigue: Secondary | ICD-10-CM | POA: Diagnosis not present

## 2022-03-10 DIAGNOSIS — R918 Other nonspecific abnormal finding of lung field: Secondary | ICD-10-CM | POA: Diagnosis not present

## 2022-03-10 DIAGNOSIS — J449 Chronic obstructive pulmonary disease, unspecified: Secondary | ICD-10-CM | POA: Diagnosis not present

## 2022-03-10 DIAGNOSIS — G2581 Restless legs syndrome: Secondary | ICD-10-CM | POA: Diagnosis not present

## 2022-03-10 DIAGNOSIS — G4737 Central sleep apnea in conditions classified elsewhere: Secondary | ICD-10-CM | POA: Diagnosis not present

## 2022-03-10 DIAGNOSIS — J301 Allergic rhinitis due to pollen: Secondary | ICD-10-CM | POA: Diagnosis not present

## 2022-03-11 DIAGNOSIS — Z9181 History of falling: Secondary | ICD-10-CM | POA: Diagnosis not present

## 2022-03-11 DIAGNOSIS — Z7951 Long term (current) use of inhaled steroids: Secondary | ICD-10-CM | POA: Diagnosis not present

## 2022-03-11 DIAGNOSIS — M2170 Unequal limb length (acquired), unspecified site: Secondary | ICD-10-CM | POA: Diagnosis not present

## 2022-03-11 DIAGNOSIS — J449 Chronic obstructive pulmonary disease, unspecified: Secondary | ICD-10-CM | POA: Diagnosis not present

## 2022-03-11 DIAGNOSIS — Z9889 Other specified postprocedural states: Secondary | ICD-10-CM | POA: Diagnosis not present

## 2022-03-11 DIAGNOSIS — Z8669 Personal history of other diseases of the nervous system and sense organs: Secondary | ICD-10-CM | POA: Diagnosis not present

## 2022-03-12 DIAGNOSIS — E039 Hypothyroidism, unspecified: Secondary | ICD-10-CM | POA: Diagnosis not present

## 2022-03-12 DIAGNOSIS — E119 Type 2 diabetes mellitus without complications: Secondary | ICD-10-CM | POA: Diagnosis not present

## 2022-03-12 DIAGNOSIS — E7800 Pure hypercholesterolemia, unspecified: Secondary | ICD-10-CM | POA: Diagnosis not present

## 2022-03-12 DIAGNOSIS — B9562 Methicillin resistant Staphylococcus aureus infection as the cause of diseases classified elsewhere: Secondary | ICD-10-CM | POA: Diagnosis not present

## 2022-03-12 DIAGNOSIS — K219 Gastro-esophageal reflux disease without esophagitis: Secondary | ICD-10-CM | POA: Diagnosis not present

## 2022-03-12 DIAGNOSIS — J690 Pneumonitis due to inhalation of food and vomit: Secondary | ICD-10-CM | POA: Diagnosis not present

## 2022-03-12 DIAGNOSIS — F1120 Opioid dependence, uncomplicated: Secondary | ICD-10-CM | POA: Diagnosis not present

## 2022-03-12 DIAGNOSIS — M1990 Unspecified osteoarthritis, unspecified site: Secondary | ICD-10-CM | POA: Diagnosis not present

## 2022-03-12 DIAGNOSIS — I10 Essential (primary) hypertension: Secondary | ICD-10-CM | POA: Diagnosis not present

## 2022-03-12 DIAGNOSIS — G8921 Chronic pain due to trauma: Secondary | ICD-10-CM | POA: Diagnosis not present

## 2022-03-12 DIAGNOSIS — Z931 Gastrostomy status: Secondary | ICD-10-CM | POA: Diagnosis not present

## 2022-03-12 DIAGNOSIS — Z9181 History of falling: Secondary | ICD-10-CM | POA: Diagnosis not present

## 2022-03-12 DIAGNOSIS — Z87891 Personal history of nicotine dependence: Secondary | ICD-10-CM | POA: Diagnosis not present

## 2022-03-12 DIAGNOSIS — Z993 Dependence on wheelchair: Secondary | ICD-10-CM | POA: Diagnosis not present

## 2022-03-12 DIAGNOSIS — F419 Anxiety disorder, unspecified: Secondary | ICD-10-CM | POA: Diagnosis not present

## 2022-03-12 DIAGNOSIS — K2900 Acute gastritis without bleeding: Secondary | ICD-10-CM | POA: Diagnosis not present

## 2022-03-12 DIAGNOSIS — F32A Depression, unspecified: Secondary | ICD-10-CM | POA: Diagnosis not present

## 2022-03-12 DIAGNOSIS — Z7984 Long term (current) use of oral hypoglycemic drugs: Secondary | ICD-10-CM | POA: Diagnosis not present

## 2022-03-12 DIAGNOSIS — Z79899 Other long term (current) drug therapy: Secondary | ICD-10-CM | POA: Diagnosis not present

## 2022-03-17 DIAGNOSIS — Z79891 Long term (current) use of opiate analgesic: Secondary | ICD-10-CM | POA: Diagnosis not present

## 2022-03-17 DIAGNOSIS — Z9181 History of falling: Secondary | ICD-10-CM | POA: Diagnosis not present

## 2022-03-17 DIAGNOSIS — F331 Major depressive disorder, recurrent, moderate: Secondary | ICD-10-CM | POA: Diagnosis not present

## 2022-03-17 DIAGNOSIS — F419 Anxiety disorder, unspecified: Secondary | ICD-10-CM | POA: Diagnosis not present

## 2022-03-17 DIAGNOSIS — Z7951 Long term (current) use of inhaled steroids: Secondary | ICD-10-CM | POA: Diagnosis not present

## 2022-03-17 DIAGNOSIS — M1990 Unspecified osteoarthritis, unspecified site: Secondary | ICD-10-CM | POA: Diagnosis not present

## 2022-03-17 DIAGNOSIS — F1120 Opioid dependence, uncomplicated: Secondary | ICD-10-CM | POA: Diagnosis not present

## 2022-03-17 DIAGNOSIS — R64 Cachexia: Secondary | ICD-10-CM | POA: Diagnosis not present

## 2022-03-17 DIAGNOSIS — Z87891 Personal history of nicotine dependence: Secondary | ICD-10-CM | POA: Diagnosis not present

## 2022-03-17 DIAGNOSIS — E7800 Pure hypercholesterolemia, unspecified: Secondary | ICD-10-CM | POA: Diagnosis not present

## 2022-03-17 DIAGNOSIS — J189 Pneumonia, unspecified organism: Secondary | ICD-10-CM | POA: Diagnosis not present

## 2022-03-17 DIAGNOSIS — Z993 Dependence on wheelchair: Secondary | ICD-10-CM | POA: Diagnosis not present

## 2022-03-17 DIAGNOSIS — I10 Essential (primary) hypertension: Secondary | ICD-10-CM | POA: Diagnosis not present

## 2022-03-17 DIAGNOSIS — F32A Depression, unspecified: Secondary | ICD-10-CM | POA: Diagnosis not present

## 2022-03-17 DIAGNOSIS — K2900 Acute gastritis without bleeding: Secondary | ICD-10-CM | POA: Diagnosis not present

## 2022-03-17 DIAGNOSIS — E119 Type 2 diabetes mellitus without complications: Secondary | ICD-10-CM | POA: Diagnosis not present

## 2022-03-17 DIAGNOSIS — E785 Hyperlipidemia, unspecified: Secondary | ICD-10-CM | POA: Diagnosis not present

## 2022-03-17 DIAGNOSIS — K219 Gastro-esophageal reflux disease without esophagitis: Secondary | ICD-10-CM | POA: Diagnosis not present

## 2022-03-17 DIAGNOSIS — E559 Vitamin D deficiency, unspecified: Secondary | ICD-10-CM | POA: Diagnosis not present

## 2022-03-17 DIAGNOSIS — Z7984 Long term (current) use of oral hypoglycemic drugs: Secondary | ICD-10-CM | POA: Diagnosis not present

## 2022-03-17 DIAGNOSIS — E1140 Type 2 diabetes mellitus with diabetic neuropathy, unspecified: Secondary | ICD-10-CM | POA: Diagnosis not present

## 2022-03-17 DIAGNOSIS — Z931 Gastrostomy status: Secondary | ICD-10-CM | POA: Diagnosis not present

## 2022-03-17 DIAGNOSIS — G8921 Chronic pain due to trauma: Secondary | ICD-10-CM | POA: Diagnosis not present

## 2022-03-17 DIAGNOSIS — Z79899 Other long term (current) drug therapy: Secondary | ICD-10-CM | POA: Diagnosis not present

## 2022-03-17 DIAGNOSIS — J440 Chronic obstructive pulmonary disease with acute lower respiratory infection: Secondary | ICD-10-CM | POA: Diagnosis not present

## 2022-03-17 DIAGNOSIS — Z6820 Body mass index (BMI) 20.0-20.9, adult: Secondary | ICD-10-CM | POA: Diagnosis not present

## 2022-03-17 DIAGNOSIS — K2960 Other gastritis without bleeding: Secondary | ICD-10-CM | POA: Diagnosis not present

## 2022-03-17 DIAGNOSIS — E039 Hypothyroidism, unspecified: Secondary | ICD-10-CM | POA: Diagnosis not present

## 2022-03-19 DIAGNOSIS — K219 Gastro-esophageal reflux disease without esophagitis: Secondary | ICD-10-CM | POA: Diagnosis not present

## 2022-03-19 DIAGNOSIS — I10 Essential (primary) hypertension: Secondary | ICD-10-CM | POA: Diagnosis not present

## 2022-03-19 DIAGNOSIS — Z993 Dependence on wheelchair: Secondary | ICD-10-CM | POA: Diagnosis not present

## 2022-03-19 DIAGNOSIS — E119 Type 2 diabetes mellitus without complications: Secondary | ICD-10-CM | POA: Diagnosis not present

## 2022-03-19 DIAGNOSIS — Z79899 Other long term (current) drug therapy: Secondary | ICD-10-CM | POA: Diagnosis not present

## 2022-03-19 DIAGNOSIS — Z7984 Long term (current) use of oral hypoglycemic drugs: Secondary | ICD-10-CM | POA: Diagnosis not present

## 2022-03-19 DIAGNOSIS — J189 Pneumonia, unspecified organism: Secondary | ICD-10-CM | POA: Diagnosis not present

## 2022-03-19 DIAGNOSIS — Z79891 Long term (current) use of opiate analgesic: Secondary | ICD-10-CM | POA: Diagnosis not present

## 2022-03-19 DIAGNOSIS — K2900 Acute gastritis without bleeding: Secondary | ICD-10-CM | POA: Diagnosis not present

## 2022-03-19 DIAGNOSIS — J440 Chronic obstructive pulmonary disease with acute lower respiratory infection: Secondary | ICD-10-CM | POA: Diagnosis not present

## 2022-03-19 DIAGNOSIS — F32A Depression, unspecified: Secondary | ICD-10-CM | POA: Diagnosis not present

## 2022-03-19 DIAGNOSIS — Z87891 Personal history of nicotine dependence: Secondary | ICD-10-CM | POA: Diagnosis not present

## 2022-03-19 DIAGNOSIS — F1120 Opioid dependence, uncomplicated: Secondary | ICD-10-CM | POA: Diagnosis not present

## 2022-03-19 DIAGNOSIS — E7800 Pure hypercholesterolemia, unspecified: Secondary | ICD-10-CM | POA: Diagnosis not present

## 2022-03-19 DIAGNOSIS — G8921 Chronic pain due to trauma: Secondary | ICD-10-CM | POA: Diagnosis not present

## 2022-03-19 DIAGNOSIS — Z9181 History of falling: Secondary | ICD-10-CM | POA: Diagnosis not present

## 2022-03-19 DIAGNOSIS — E039 Hypothyroidism, unspecified: Secondary | ICD-10-CM | POA: Diagnosis not present

## 2022-03-19 DIAGNOSIS — Z7951 Long term (current) use of inhaled steroids: Secondary | ICD-10-CM | POA: Diagnosis not present

## 2022-03-19 DIAGNOSIS — F419 Anxiety disorder, unspecified: Secondary | ICD-10-CM | POA: Diagnosis not present

## 2022-03-19 DIAGNOSIS — Z931 Gastrostomy status: Secondary | ICD-10-CM | POA: Diagnosis not present

## 2022-03-19 DIAGNOSIS — M1990 Unspecified osteoarthritis, unspecified site: Secondary | ICD-10-CM | POA: Diagnosis not present

## 2022-03-28 DIAGNOSIS — Z8701 Personal history of pneumonia (recurrent): Secondary | ICD-10-CM | POA: Diagnosis not present

## 2022-03-28 DIAGNOSIS — I10 Essential (primary) hypertension: Secondary | ICD-10-CM | POA: Diagnosis not present

## 2022-03-28 DIAGNOSIS — Z9181 History of falling: Secondary | ICD-10-CM | POA: Diagnosis not present

## 2022-03-28 DIAGNOSIS — Z7951 Long term (current) use of inhaled steroids: Secondary | ICD-10-CM | POA: Diagnosis not present

## 2022-03-28 DIAGNOSIS — F1120 Opioid dependence, uncomplicated: Secondary | ICD-10-CM | POA: Diagnosis not present

## 2022-03-28 DIAGNOSIS — K219 Gastro-esophageal reflux disease without esophagitis: Secondary | ICD-10-CM | POA: Diagnosis not present

## 2022-03-28 DIAGNOSIS — J449 Chronic obstructive pulmonary disease, unspecified: Secondary | ICD-10-CM | POA: Diagnosis not present

## 2022-03-28 DIAGNOSIS — F32A Depression, unspecified: Secondary | ICD-10-CM | POA: Diagnosis not present

## 2022-03-28 DIAGNOSIS — Z993 Dependence on wheelchair: Secondary | ICD-10-CM | POA: Diagnosis not present

## 2022-03-28 DIAGNOSIS — Z7984 Long term (current) use of oral hypoglycemic drugs: Secondary | ICD-10-CM | POA: Diagnosis not present

## 2022-03-28 DIAGNOSIS — Z931 Gastrostomy status: Secondary | ICD-10-CM | POA: Diagnosis not present

## 2022-03-28 DIAGNOSIS — Z79899 Other long term (current) drug therapy: Secondary | ICD-10-CM | POA: Diagnosis not present

## 2022-03-28 DIAGNOSIS — F419 Anxiety disorder, unspecified: Secondary | ICD-10-CM | POA: Diagnosis not present

## 2022-03-28 DIAGNOSIS — K2900 Acute gastritis without bleeding: Secondary | ICD-10-CM | POA: Diagnosis not present

## 2022-03-28 DIAGNOSIS — E7800 Pure hypercholesterolemia, unspecified: Secondary | ICD-10-CM | POA: Diagnosis not present

## 2022-03-28 DIAGNOSIS — E039 Hypothyroidism, unspecified: Secondary | ICD-10-CM | POA: Diagnosis not present

## 2022-03-28 DIAGNOSIS — Z87891 Personal history of nicotine dependence: Secondary | ICD-10-CM | POA: Diagnosis not present

## 2022-03-28 DIAGNOSIS — Z79891 Long term (current) use of opiate analgesic: Secondary | ICD-10-CM | POA: Diagnosis not present

## 2022-03-28 DIAGNOSIS — G8921 Chronic pain due to trauma: Secondary | ICD-10-CM | POA: Diagnosis not present

## 2022-03-28 DIAGNOSIS — M1990 Unspecified osteoarthritis, unspecified site: Secondary | ICD-10-CM | POA: Diagnosis not present

## 2022-03-28 DIAGNOSIS — E119 Type 2 diabetes mellitus without complications: Secondary | ICD-10-CM | POA: Diagnosis not present

## 2022-04-07 DIAGNOSIS — J449 Chronic obstructive pulmonary disease, unspecified: Secondary | ICD-10-CM | POA: Diagnosis not present

## 2022-04-08 DIAGNOSIS — Z7984 Long term (current) use of oral hypoglycemic drugs: Secondary | ICD-10-CM | POA: Diagnosis not present

## 2022-04-08 DIAGNOSIS — Z8701 Personal history of pneumonia (recurrent): Secondary | ICD-10-CM | POA: Diagnosis not present

## 2022-04-08 DIAGNOSIS — I10 Essential (primary) hypertension: Secondary | ICD-10-CM | POA: Diagnosis not present

## 2022-04-08 DIAGNOSIS — Z9181 History of falling: Secondary | ICD-10-CM | POA: Diagnosis not present

## 2022-04-08 DIAGNOSIS — F419 Anxiety disorder, unspecified: Secondary | ICD-10-CM | POA: Diagnosis not present

## 2022-04-08 DIAGNOSIS — Z931 Gastrostomy status: Secondary | ICD-10-CM | POA: Diagnosis not present

## 2022-04-08 DIAGNOSIS — K219 Gastro-esophageal reflux disease without esophagitis: Secondary | ICD-10-CM | POA: Diagnosis not present

## 2022-04-08 DIAGNOSIS — E039 Hypothyroidism, unspecified: Secondary | ICD-10-CM | POA: Diagnosis not present

## 2022-04-08 DIAGNOSIS — F1120 Opioid dependence, uncomplicated: Secondary | ICD-10-CM | POA: Diagnosis not present

## 2022-04-08 DIAGNOSIS — G8921 Chronic pain due to trauma: Secondary | ICD-10-CM | POA: Diagnosis not present

## 2022-04-08 DIAGNOSIS — F32A Depression, unspecified: Secondary | ICD-10-CM | POA: Diagnosis not present

## 2022-04-08 DIAGNOSIS — M1990 Unspecified osteoarthritis, unspecified site: Secondary | ICD-10-CM | POA: Diagnosis not present

## 2022-04-08 DIAGNOSIS — Z993 Dependence on wheelchair: Secondary | ICD-10-CM | POA: Diagnosis not present

## 2022-04-08 DIAGNOSIS — K2900 Acute gastritis without bleeding: Secondary | ICD-10-CM | POA: Diagnosis not present

## 2022-04-08 DIAGNOSIS — Z79899 Other long term (current) drug therapy: Secondary | ICD-10-CM | POA: Diagnosis not present

## 2022-04-08 DIAGNOSIS — E7800 Pure hypercholesterolemia, unspecified: Secondary | ICD-10-CM | POA: Diagnosis not present

## 2022-04-08 DIAGNOSIS — J449 Chronic obstructive pulmonary disease, unspecified: Secondary | ICD-10-CM | POA: Diagnosis not present

## 2022-04-08 DIAGNOSIS — Z87891 Personal history of nicotine dependence: Secondary | ICD-10-CM | POA: Diagnosis not present

## 2022-04-08 DIAGNOSIS — Z7951 Long term (current) use of inhaled steroids: Secondary | ICD-10-CM | POA: Diagnosis not present

## 2022-04-08 DIAGNOSIS — E119 Type 2 diabetes mellitus without complications: Secondary | ICD-10-CM | POA: Diagnosis not present

## 2022-04-08 DIAGNOSIS — Z79891 Long term (current) use of opiate analgesic: Secondary | ICD-10-CM | POA: Diagnosis not present

## 2022-04-17 DIAGNOSIS — J15212 Pneumonia due to Methicillin resistant Staphylococcus aureus: Secondary | ICD-10-CM | POA: Diagnosis not present

## 2022-04-17 DIAGNOSIS — Z9181 History of falling: Secondary | ICD-10-CM | POA: Diagnosis not present

## 2022-04-17 DIAGNOSIS — K219 Gastro-esophageal reflux disease without esophagitis: Secondary | ICD-10-CM | POA: Diagnosis not present

## 2022-04-17 DIAGNOSIS — G9341 Metabolic encephalopathy: Secondary | ICD-10-CM | POA: Diagnosis not present

## 2022-04-17 DIAGNOSIS — J440 Chronic obstructive pulmonary disease with acute lower respiratory infection: Secondary | ICD-10-CM | POA: Diagnosis not present

## 2022-04-17 DIAGNOSIS — J449 Chronic obstructive pulmonary disease, unspecified: Secondary | ICD-10-CM | POA: Diagnosis not present

## 2022-04-17 DIAGNOSIS — Z993 Dependence on wheelchair: Secondary | ICD-10-CM | POA: Diagnosis not present

## 2022-04-17 DIAGNOSIS — E7800 Pure hypercholesterolemia, unspecified: Secondary | ICD-10-CM | POA: Diagnosis not present

## 2022-04-17 DIAGNOSIS — E119 Type 2 diabetes mellitus without complications: Secondary | ICD-10-CM | POA: Diagnosis not present

## 2022-04-17 DIAGNOSIS — Z79891 Long term (current) use of opiate analgesic: Secondary | ICD-10-CM | POA: Diagnosis not present

## 2022-04-17 DIAGNOSIS — F419 Anxiety disorder, unspecified: Secondary | ICD-10-CM | POA: Diagnosis not present

## 2022-04-17 DIAGNOSIS — G8921 Chronic pain due to trauma: Secondary | ICD-10-CM | POA: Diagnosis not present

## 2022-04-17 DIAGNOSIS — Z7951 Long term (current) use of inhaled steroids: Secondary | ICD-10-CM | POA: Diagnosis not present

## 2022-04-17 DIAGNOSIS — Z7984 Long term (current) use of oral hypoglycemic drugs: Secondary | ICD-10-CM | POA: Diagnosis not present

## 2022-04-17 DIAGNOSIS — R918 Other nonspecific abnormal finding of lung field: Secondary | ICD-10-CM | POA: Diagnosis not present

## 2022-04-17 DIAGNOSIS — F1120 Opioid dependence, uncomplicated: Secondary | ICD-10-CM | POA: Diagnosis not present

## 2022-04-17 DIAGNOSIS — J189 Pneumonia, unspecified organism: Secondary | ICD-10-CM | POA: Diagnosis not present

## 2022-04-17 DIAGNOSIS — Z20822 Contact with and (suspected) exposure to covid-19: Secondary | ICD-10-CM | POA: Diagnosis not present

## 2022-04-17 DIAGNOSIS — F32A Depression, unspecified: Secondary | ICD-10-CM | POA: Diagnosis not present

## 2022-04-17 DIAGNOSIS — R0602 Shortness of breath: Secondary | ICD-10-CM | POA: Diagnosis not present

## 2022-04-17 DIAGNOSIS — E039 Hypothyroidism, unspecified: Secondary | ICD-10-CM | POA: Diagnosis not present

## 2022-04-17 DIAGNOSIS — J181 Lobar pneumonia, unspecified organism: Secondary | ICD-10-CM | POA: Diagnosis not present

## 2022-04-17 DIAGNOSIS — Z8701 Personal history of pneumonia (recurrent): Secondary | ICD-10-CM | POA: Diagnosis not present

## 2022-04-17 DIAGNOSIS — Z79899 Other long term (current) drug therapy: Secondary | ICD-10-CM | POA: Diagnosis not present

## 2022-04-17 DIAGNOSIS — J9601 Acute respiratory failure with hypoxia: Secondary | ICD-10-CM | POA: Diagnosis not present

## 2022-04-17 DIAGNOSIS — I10 Essential (primary) hypertension: Secondary | ICD-10-CM | POA: Diagnosis not present

## 2022-04-17 DIAGNOSIS — K5730 Diverticulosis of large intestine without perforation or abscess without bleeding: Secondary | ICD-10-CM | POA: Diagnosis not present

## 2022-04-17 DIAGNOSIS — K2900 Acute gastritis without bleeding: Secondary | ICD-10-CM | POA: Diagnosis not present

## 2022-04-17 DIAGNOSIS — Z931 Gastrostomy status: Secondary | ICD-10-CM | POA: Diagnosis not present

## 2022-04-17 DIAGNOSIS — Z87891 Personal history of nicotine dependence: Secondary | ICD-10-CM | POA: Diagnosis not present

## 2022-04-17 DIAGNOSIS — M1990 Unspecified osteoarthritis, unspecified site: Secondary | ICD-10-CM | POA: Diagnosis not present

## 2022-04-17 DIAGNOSIS — R4182 Altered mental status, unspecified: Secondary | ICD-10-CM | POA: Diagnosis not present

## 2022-04-17 DIAGNOSIS — R6510 Systemic inflammatory response syndrome (SIRS) of non-infectious origin without acute organ dysfunction: Secondary | ICD-10-CM | POA: Diagnosis not present

## 2022-04-17 DIAGNOSIS — Z792 Long term (current) use of antibiotics: Secondary | ICD-10-CM | POA: Diagnosis not present

## 2022-04-22 DIAGNOSIS — Z931 Gastrostomy status: Secondary | ICD-10-CM | POA: Diagnosis not present

## 2022-04-22 DIAGNOSIS — Z79891 Long term (current) use of opiate analgesic: Secondary | ICD-10-CM | POA: Diagnosis not present

## 2022-04-22 DIAGNOSIS — F419 Anxiety disorder, unspecified: Secondary | ICD-10-CM | POA: Diagnosis not present

## 2022-04-22 DIAGNOSIS — J449 Chronic obstructive pulmonary disease, unspecified: Secondary | ICD-10-CM | POA: Diagnosis not present

## 2022-04-22 DIAGNOSIS — R053 Chronic cough: Secondary | ICD-10-CM | POA: Diagnosis not present

## 2022-04-22 DIAGNOSIS — Z7951 Long term (current) use of inhaled steroids: Secondary | ICD-10-CM | POA: Diagnosis not present

## 2022-04-22 DIAGNOSIS — Z79899 Other long term (current) drug therapy: Secondary | ICD-10-CM | POA: Diagnosis not present

## 2022-04-22 DIAGNOSIS — F1120 Opioid dependence, uncomplicated: Secondary | ICD-10-CM | POA: Diagnosis not present

## 2022-04-22 DIAGNOSIS — Z9181 History of falling: Secondary | ICD-10-CM | POA: Diagnosis not present

## 2022-04-22 DIAGNOSIS — Z7984 Long term (current) use of oral hypoglycemic drugs: Secondary | ICD-10-CM | POA: Diagnosis not present

## 2022-04-22 DIAGNOSIS — I10 Essential (primary) hypertension: Secondary | ICD-10-CM | POA: Diagnosis not present

## 2022-04-22 DIAGNOSIS — G8921 Chronic pain due to trauma: Secondary | ICD-10-CM | POA: Diagnosis not present

## 2022-04-22 DIAGNOSIS — Z87891 Personal history of nicotine dependence: Secondary | ICD-10-CM | POA: Diagnosis not present

## 2022-04-22 DIAGNOSIS — E039 Hypothyroidism, unspecified: Secondary | ICD-10-CM | POA: Diagnosis not present

## 2022-04-22 DIAGNOSIS — M1990 Unspecified osteoarthritis, unspecified site: Secondary | ICD-10-CM | POA: Diagnosis not present

## 2022-04-22 DIAGNOSIS — E7800 Pure hypercholesterolemia, unspecified: Secondary | ICD-10-CM | POA: Diagnosis not present

## 2022-04-22 DIAGNOSIS — E119 Type 2 diabetes mellitus without complications: Secondary | ICD-10-CM | POA: Diagnosis not present

## 2022-04-22 DIAGNOSIS — Z8701 Personal history of pneumonia (recurrent): Secondary | ICD-10-CM | POA: Diagnosis not present

## 2022-04-22 DIAGNOSIS — K219 Gastro-esophageal reflux disease without esophagitis: Secondary | ICD-10-CM | POA: Diagnosis not present

## 2022-04-22 DIAGNOSIS — A4902 Methicillin resistant Staphylococcus aureus infection, unspecified site: Secondary | ICD-10-CM | POA: Diagnosis not present

## 2022-04-22 DIAGNOSIS — K2900 Acute gastritis without bleeding: Secondary | ICD-10-CM | POA: Diagnosis not present

## 2022-04-22 DIAGNOSIS — F32A Depression, unspecified: Secondary | ICD-10-CM | POA: Diagnosis not present

## 2022-04-22 DIAGNOSIS — Z993 Dependence on wheelchair: Secondary | ICD-10-CM | POA: Diagnosis not present

## 2022-04-26 DIAGNOSIS — Z8701 Personal history of pneumonia (recurrent): Secondary | ICD-10-CM | POA: Diagnosis not present

## 2022-04-26 DIAGNOSIS — Z993 Dependence on wheelchair: Secondary | ICD-10-CM | POA: Diagnosis not present

## 2022-04-26 DIAGNOSIS — Z87891 Personal history of nicotine dependence: Secondary | ICD-10-CM | POA: Diagnosis not present

## 2022-04-26 DIAGNOSIS — E119 Type 2 diabetes mellitus without complications: Secondary | ICD-10-CM | POA: Diagnosis not present

## 2022-04-26 DIAGNOSIS — I10 Essential (primary) hypertension: Secondary | ICD-10-CM | POA: Diagnosis not present

## 2022-04-26 DIAGNOSIS — Z9181 History of falling: Secondary | ICD-10-CM | POA: Diagnosis not present

## 2022-04-26 DIAGNOSIS — K219 Gastro-esophageal reflux disease without esophagitis: Secondary | ICD-10-CM | POA: Diagnosis not present

## 2022-04-26 DIAGNOSIS — J449 Chronic obstructive pulmonary disease, unspecified: Secondary | ICD-10-CM | POA: Diagnosis not present

## 2022-04-26 DIAGNOSIS — F32A Depression, unspecified: Secondary | ICD-10-CM | POA: Diagnosis not present

## 2022-04-26 DIAGNOSIS — E039 Hypothyroidism, unspecified: Secondary | ICD-10-CM | POA: Diagnosis not present

## 2022-04-26 DIAGNOSIS — E7800 Pure hypercholesterolemia, unspecified: Secondary | ICD-10-CM | POA: Diagnosis not present

## 2022-04-26 DIAGNOSIS — Z931 Gastrostomy status: Secondary | ICD-10-CM | POA: Diagnosis not present

## 2022-04-26 DIAGNOSIS — M1990 Unspecified osteoarthritis, unspecified site: Secondary | ICD-10-CM | POA: Diagnosis not present

## 2022-04-26 DIAGNOSIS — Z79891 Long term (current) use of opiate analgesic: Secondary | ICD-10-CM | POA: Diagnosis not present

## 2022-04-26 DIAGNOSIS — G8921 Chronic pain due to trauma: Secondary | ICD-10-CM | POA: Diagnosis not present

## 2022-04-26 DIAGNOSIS — F419 Anxiety disorder, unspecified: Secondary | ICD-10-CM | POA: Diagnosis not present

## 2022-04-26 DIAGNOSIS — Z7984 Long term (current) use of oral hypoglycemic drugs: Secondary | ICD-10-CM | POA: Diagnosis not present

## 2022-04-26 DIAGNOSIS — Z7951 Long term (current) use of inhaled steroids: Secondary | ICD-10-CM | POA: Diagnosis not present

## 2022-04-26 DIAGNOSIS — F1120 Opioid dependence, uncomplicated: Secondary | ICD-10-CM | POA: Diagnosis not present

## 2022-04-26 DIAGNOSIS — K2900 Acute gastritis without bleeding: Secondary | ICD-10-CM | POA: Diagnosis not present

## 2022-04-26 DIAGNOSIS — Z79899 Other long term (current) drug therapy: Secondary | ICD-10-CM | POA: Diagnosis not present

## 2022-04-30 DIAGNOSIS — Z7951 Long term (current) use of inhaled steroids: Secondary | ICD-10-CM | POA: Diagnosis not present

## 2022-04-30 DIAGNOSIS — Z7984 Long term (current) use of oral hypoglycemic drugs: Secondary | ICD-10-CM | POA: Diagnosis not present

## 2022-04-30 DIAGNOSIS — E119 Type 2 diabetes mellitus without complications: Secondary | ICD-10-CM | POA: Diagnosis not present

## 2022-04-30 DIAGNOSIS — Z79891 Long term (current) use of opiate analgesic: Secondary | ICD-10-CM | POA: Diagnosis not present

## 2022-04-30 DIAGNOSIS — E039 Hypothyroidism, unspecified: Secondary | ICD-10-CM | POA: Diagnosis not present

## 2022-04-30 DIAGNOSIS — Z79899 Other long term (current) drug therapy: Secondary | ICD-10-CM | POA: Diagnosis not present

## 2022-04-30 DIAGNOSIS — E7800 Pure hypercholesterolemia, unspecified: Secondary | ICD-10-CM | POA: Diagnosis not present

## 2022-04-30 DIAGNOSIS — Z8701 Personal history of pneumonia (recurrent): Secondary | ICD-10-CM | POA: Diagnosis not present

## 2022-04-30 DIAGNOSIS — K2900 Acute gastritis without bleeding: Secondary | ICD-10-CM | POA: Diagnosis not present

## 2022-04-30 DIAGNOSIS — J449 Chronic obstructive pulmonary disease, unspecified: Secondary | ICD-10-CM | POA: Diagnosis not present

## 2022-04-30 DIAGNOSIS — F1120 Opioid dependence, uncomplicated: Secondary | ICD-10-CM | POA: Diagnosis not present

## 2022-04-30 DIAGNOSIS — F32A Depression, unspecified: Secondary | ICD-10-CM | POA: Diagnosis not present

## 2022-04-30 DIAGNOSIS — K219 Gastro-esophageal reflux disease without esophagitis: Secondary | ICD-10-CM | POA: Diagnosis not present

## 2022-04-30 DIAGNOSIS — G8921 Chronic pain due to trauma: Secondary | ICD-10-CM | POA: Diagnosis not present

## 2022-04-30 DIAGNOSIS — I10 Essential (primary) hypertension: Secondary | ICD-10-CM | POA: Diagnosis not present

## 2022-04-30 DIAGNOSIS — Z87891 Personal history of nicotine dependence: Secondary | ICD-10-CM | POA: Diagnosis not present

## 2022-04-30 DIAGNOSIS — M1990 Unspecified osteoarthritis, unspecified site: Secondary | ICD-10-CM | POA: Diagnosis not present

## 2022-04-30 DIAGNOSIS — F419 Anxiety disorder, unspecified: Secondary | ICD-10-CM | POA: Diagnosis not present

## 2022-04-30 DIAGNOSIS — Z9181 History of falling: Secondary | ICD-10-CM | POA: Diagnosis not present

## 2022-04-30 DIAGNOSIS — Z993 Dependence on wheelchair: Secondary | ICD-10-CM | POA: Diagnosis not present

## 2022-04-30 DIAGNOSIS — Z931 Gastrostomy status: Secondary | ICD-10-CM | POA: Diagnosis not present

## 2022-05-02 DIAGNOSIS — K219 Gastro-esophageal reflux disease without esophagitis: Secondary | ICD-10-CM | POA: Diagnosis not present

## 2022-05-02 DIAGNOSIS — Z7951 Long term (current) use of inhaled steroids: Secondary | ICD-10-CM | POA: Diagnosis not present

## 2022-05-02 DIAGNOSIS — Z8701 Personal history of pneumonia (recurrent): Secondary | ICD-10-CM | POA: Diagnosis not present

## 2022-05-02 DIAGNOSIS — Z9181 History of falling: Secondary | ICD-10-CM | POA: Diagnosis not present

## 2022-05-02 DIAGNOSIS — Z7984 Long term (current) use of oral hypoglycemic drugs: Secondary | ICD-10-CM | POA: Diagnosis not present

## 2022-05-02 DIAGNOSIS — M1990 Unspecified osteoarthritis, unspecified site: Secondary | ICD-10-CM | POA: Diagnosis not present

## 2022-05-02 DIAGNOSIS — Z79891 Long term (current) use of opiate analgesic: Secondary | ICD-10-CM | POA: Diagnosis not present

## 2022-05-02 DIAGNOSIS — F32A Depression, unspecified: Secondary | ICD-10-CM | POA: Diagnosis not present

## 2022-05-02 DIAGNOSIS — K2900 Acute gastritis without bleeding: Secondary | ICD-10-CM | POA: Diagnosis not present

## 2022-05-02 DIAGNOSIS — Z87891 Personal history of nicotine dependence: Secondary | ICD-10-CM | POA: Diagnosis not present

## 2022-05-02 DIAGNOSIS — J449 Chronic obstructive pulmonary disease, unspecified: Secondary | ICD-10-CM | POA: Diagnosis not present

## 2022-05-02 DIAGNOSIS — E039 Hypothyroidism, unspecified: Secondary | ICD-10-CM | POA: Diagnosis not present

## 2022-05-02 DIAGNOSIS — I10 Essential (primary) hypertension: Secondary | ICD-10-CM | POA: Diagnosis not present

## 2022-05-02 DIAGNOSIS — Z79899 Other long term (current) drug therapy: Secondary | ICD-10-CM | POA: Diagnosis not present

## 2022-05-02 DIAGNOSIS — G8921 Chronic pain due to trauma: Secondary | ICD-10-CM | POA: Diagnosis not present

## 2022-05-02 DIAGNOSIS — E7800 Pure hypercholesterolemia, unspecified: Secondary | ICD-10-CM | POA: Diagnosis not present

## 2022-05-02 DIAGNOSIS — F419 Anxiety disorder, unspecified: Secondary | ICD-10-CM | POA: Diagnosis not present

## 2022-05-02 DIAGNOSIS — Z931 Gastrostomy status: Secondary | ICD-10-CM | POA: Diagnosis not present

## 2022-05-02 DIAGNOSIS — E119 Type 2 diabetes mellitus without complications: Secondary | ICD-10-CM | POA: Diagnosis not present

## 2022-05-02 DIAGNOSIS — Z993 Dependence on wheelchair: Secondary | ICD-10-CM | POA: Diagnosis not present

## 2022-05-02 DIAGNOSIS — F1120 Opioid dependence, uncomplicated: Secondary | ICD-10-CM | POA: Diagnosis not present

## 2022-05-06 DIAGNOSIS — G4733 Obstructive sleep apnea (adult) (pediatric): Secondary | ICD-10-CM | POA: Diagnosis not present

## 2022-05-06 DIAGNOSIS — G4737 Central sleep apnea in conditions classified elsewhere: Secondary | ICD-10-CM | POA: Diagnosis not present

## 2022-05-07 DIAGNOSIS — J189 Pneumonia, unspecified organism: Secondary | ICD-10-CM | POA: Diagnosis not present

## 2022-05-07 DIAGNOSIS — E119 Type 2 diabetes mellitus without complications: Secondary | ICD-10-CM | POA: Diagnosis not present

## 2022-05-07 DIAGNOSIS — J440 Chronic obstructive pulmonary disease with acute lower respiratory infection: Secondary | ICD-10-CM | POA: Diagnosis not present

## 2022-05-08 DIAGNOSIS — M1990 Unspecified osteoarthritis, unspecified site: Secondary | ICD-10-CM | POA: Diagnosis not present

## 2022-05-08 DIAGNOSIS — Z79899 Other long term (current) drug therapy: Secondary | ICD-10-CM | POA: Diagnosis not present

## 2022-05-08 DIAGNOSIS — F419 Anxiety disorder, unspecified: Secondary | ICD-10-CM | POA: Diagnosis not present

## 2022-05-08 DIAGNOSIS — Z993 Dependence on wheelchair: Secondary | ICD-10-CM | POA: Diagnosis not present

## 2022-05-08 DIAGNOSIS — Z8701 Personal history of pneumonia (recurrent): Secondary | ICD-10-CM | POA: Diagnosis not present

## 2022-05-08 DIAGNOSIS — E7800 Pure hypercholesterolemia, unspecified: Secondary | ICD-10-CM | POA: Diagnosis not present

## 2022-05-08 DIAGNOSIS — Z931 Gastrostomy status: Secondary | ICD-10-CM | POA: Diagnosis not present

## 2022-05-08 DIAGNOSIS — I10 Essential (primary) hypertension: Secondary | ICD-10-CM | POA: Diagnosis not present

## 2022-05-08 DIAGNOSIS — Z87891 Personal history of nicotine dependence: Secondary | ICD-10-CM | POA: Diagnosis not present

## 2022-05-08 DIAGNOSIS — F32A Depression, unspecified: Secondary | ICD-10-CM | POA: Diagnosis not present

## 2022-05-08 DIAGNOSIS — K219 Gastro-esophageal reflux disease without esophagitis: Secondary | ICD-10-CM | POA: Diagnosis not present

## 2022-05-08 DIAGNOSIS — K2900 Acute gastritis without bleeding: Secondary | ICD-10-CM | POA: Diagnosis not present

## 2022-05-08 DIAGNOSIS — E039 Hypothyroidism, unspecified: Secondary | ICD-10-CM | POA: Diagnosis not present

## 2022-05-08 DIAGNOSIS — Z9181 History of falling: Secondary | ICD-10-CM | POA: Diagnosis not present

## 2022-05-08 DIAGNOSIS — J449 Chronic obstructive pulmonary disease, unspecified: Secondary | ICD-10-CM | POA: Diagnosis not present

## 2022-05-08 DIAGNOSIS — Z7984 Long term (current) use of oral hypoglycemic drugs: Secondary | ICD-10-CM | POA: Diagnosis not present

## 2022-05-08 DIAGNOSIS — Z7951 Long term (current) use of inhaled steroids: Secondary | ICD-10-CM | POA: Diagnosis not present

## 2022-05-08 DIAGNOSIS — F1120 Opioid dependence, uncomplicated: Secondary | ICD-10-CM | POA: Diagnosis not present

## 2022-05-08 DIAGNOSIS — Z79891 Long term (current) use of opiate analgesic: Secondary | ICD-10-CM | POA: Diagnosis not present

## 2022-05-08 DIAGNOSIS — E119 Type 2 diabetes mellitus without complications: Secondary | ICD-10-CM | POA: Diagnosis not present

## 2022-05-08 DIAGNOSIS — G8921 Chronic pain due to trauma: Secondary | ICD-10-CM | POA: Diagnosis not present

## 2022-05-10 DIAGNOSIS — E039 Hypothyroidism, unspecified: Secondary | ICD-10-CM | POA: Diagnosis not present

## 2022-05-10 DIAGNOSIS — Z79891 Long term (current) use of opiate analgesic: Secondary | ICD-10-CM | POA: Diagnosis not present

## 2022-05-10 DIAGNOSIS — I10 Essential (primary) hypertension: Secondary | ICD-10-CM | POA: Diagnosis not present

## 2022-05-10 DIAGNOSIS — F419 Anxiety disorder, unspecified: Secondary | ICD-10-CM | POA: Diagnosis not present

## 2022-05-10 DIAGNOSIS — Z9181 History of falling: Secondary | ICD-10-CM | POA: Diagnosis not present

## 2022-05-10 DIAGNOSIS — K219 Gastro-esophageal reflux disease without esophagitis: Secondary | ICD-10-CM | POA: Diagnosis not present

## 2022-05-10 DIAGNOSIS — E119 Type 2 diabetes mellitus without complications: Secondary | ICD-10-CM | POA: Diagnosis not present

## 2022-05-10 DIAGNOSIS — Z7951 Long term (current) use of inhaled steroids: Secondary | ICD-10-CM | POA: Diagnosis not present

## 2022-05-10 DIAGNOSIS — E7800 Pure hypercholesterolemia, unspecified: Secondary | ICD-10-CM | POA: Diagnosis not present

## 2022-05-10 DIAGNOSIS — Z7984 Long term (current) use of oral hypoglycemic drugs: Secondary | ICD-10-CM | POA: Diagnosis not present

## 2022-05-10 DIAGNOSIS — K2900 Acute gastritis without bleeding: Secondary | ICD-10-CM | POA: Diagnosis not present

## 2022-05-10 DIAGNOSIS — Z931 Gastrostomy status: Secondary | ICD-10-CM | POA: Diagnosis not present

## 2022-05-10 DIAGNOSIS — G8921 Chronic pain due to trauma: Secondary | ICD-10-CM | POA: Diagnosis not present

## 2022-05-10 DIAGNOSIS — F1120 Opioid dependence, uncomplicated: Secondary | ICD-10-CM | POA: Diagnosis not present

## 2022-05-10 DIAGNOSIS — Z79899 Other long term (current) drug therapy: Secondary | ICD-10-CM | POA: Diagnosis not present

## 2022-05-10 DIAGNOSIS — J449 Chronic obstructive pulmonary disease, unspecified: Secondary | ICD-10-CM | POA: Diagnosis not present

## 2022-05-10 DIAGNOSIS — Z993 Dependence on wheelchair: Secondary | ICD-10-CM | POA: Diagnosis not present

## 2022-05-10 DIAGNOSIS — Z8701 Personal history of pneumonia (recurrent): Secondary | ICD-10-CM | POA: Diagnosis not present

## 2022-05-10 DIAGNOSIS — F32A Depression, unspecified: Secondary | ICD-10-CM | POA: Diagnosis not present

## 2022-05-10 DIAGNOSIS — Z87891 Personal history of nicotine dependence: Secondary | ICD-10-CM | POA: Diagnosis not present

## 2022-05-10 DIAGNOSIS — M1990 Unspecified osteoarthritis, unspecified site: Secondary | ICD-10-CM | POA: Diagnosis not present

## 2022-05-13 DIAGNOSIS — I10 Essential (primary) hypertension: Secondary | ICD-10-CM | POA: Diagnosis not present

## 2022-05-13 DIAGNOSIS — F1120 Opioid dependence, uncomplicated: Secondary | ICD-10-CM | POA: Diagnosis not present

## 2022-05-13 DIAGNOSIS — M1990 Unspecified osteoarthritis, unspecified site: Secondary | ICD-10-CM | POA: Diagnosis not present

## 2022-05-13 DIAGNOSIS — Z79899 Other long term (current) drug therapy: Secondary | ICD-10-CM | POA: Diagnosis not present

## 2022-05-13 DIAGNOSIS — G8921 Chronic pain due to trauma: Secondary | ICD-10-CM | POA: Diagnosis not present

## 2022-05-13 DIAGNOSIS — Z87891 Personal history of nicotine dependence: Secondary | ICD-10-CM | POA: Diagnosis not present

## 2022-05-13 DIAGNOSIS — Z7984 Long term (current) use of oral hypoglycemic drugs: Secondary | ICD-10-CM | POA: Diagnosis not present

## 2022-05-13 DIAGNOSIS — F419 Anxiety disorder, unspecified: Secondary | ICD-10-CM | POA: Diagnosis not present

## 2022-05-13 DIAGNOSIS — Z931 Gastrostomy status: Secondary | ICD-10-CM | POA: Diagnosis not present

## 2022-05-13 DIAGNOSIS — Z993 Dependence on wheelchair: Secondary | ICD-10-CM | POA: Diagnosis not present

## 2022-05-13 DIAGNOSIS — Z7951 Long term (current) use of inhaled steroids: Secondary | ICD-10-CM | POA: Diagnosis not present

## 2022-05-13 DIAGNOSIS — J449 Chronic obstructive pulmonary disease, unspecified: Secondary | ICD-10-CM | POA: Diagnosis not present

## 2022-05-13 DIAGNOSIS — Z9181 History of falling: Secondary | ICD-10-CM | POA: Diagnosis not present

## 2022-05-13 DIAGNOSIS — E119 Type 2 diabetes mellitus without complications: Secondary | ICD-10-CM | POA: Diagnosis not present

## 2022-05-13 DIAGNOSIS — K2900 Acute gastritis without bleeding: Secondary | ICD-10-CM | POA: Diagnosis not present

## 2022-05-13 DIAGNOSIS — E7800 Pure hypercholesterolemia, unspecified: Secondary | ICD-10-CM | POA: Diagnosis not present

## 2022-05-13 DIAGNOSIS — F32A Depression, unspecified: Secondary | ICD-10-CM | POA: Diagnosis not present

## 2022-05-13 DIAGNOSIS — Z79891 Long term (current) use of opiate analgesic: Secondary | ICD-10-CM | POA: Diagnosis not present

## 2022-05-13 DIAGNOSIS — E039 Hypothyroidism, unspecified: Secondary | ICD-10-CM | POA: Diagnosis not present

## 2022-05-13 DIAGNOSIS — K219 Gastro-esophageal reflux disease without esophagitis: Secondary | ICD-10-CM | POA: Diagnosis not present

## 2022-05-13 DIAGNOSIS — Z8701 Personal history of pneumonia (recurrent): Secondary | ICD-10-CM | POA: Diagnosis not present

## 2022-05-14 DIAGNOSIS — H26493 Other secondary cataract, bilateral: Secondary | ICD-10-CM | POA: Diagnosis not present

## 2022-05-15 DIAGNOSIS — Z7984 Long term (current) use of oral hypoglycemic drugs: Secondary | ICD-10-CM | POA: Diagnosis not present

## 2022-05-15 DIAGNOSIS — Z79891 Long term (current) use of opiate analgesic: Secondary | ICD-10-CM | POA: Diagnosis not present

## 2022-05-15 DIAGNOSIS — M1990 Unspecified osteoarthritis, unspecified site: Secondary | ICD-10-CM | POA: Diagnosis not present

## 2022-05-15 DIAGNOSIS — K2900 Acute gastritis without bleeding: Secondary | ICD-10-CM | POA: Diagnosis not present

## 2022-05-15 DIAGNOSIS — Z8701 Personal history of pneumonia (recurrent): Secondary | ICD-10-CM | POA: Diagnosis not present

## 2022-05-15 DIAGNOSIS — E7800 Pure hypercholesterolemia, unspecified: Secondary | ICD-10-CM | POA: Diagnosis not present

## 2022-05-15 DIAGNOSIS — Z7951 Long term (current) use of inhaled steroids: Secondary | ICD-10-CM | POA: Diagnosis not present

## 2022-05-15 DIAGNOSIS — Z931 Gastrostomy status: Secondary | ICD-10-CM | POA: Diagnosis not present

## 2022-05-15 DIAGNOSIS — E039 Hypothyroidism, unspecified: Secondary | ICD-10-CM | POA: Diagnosis not present

## 2022-05-15 DIAGNOSIS — Z79899 Other long term (current) drug therapy: Secondary | ICD-10-CM | POA: Diagnosis not present

## 2022-05-15 DIAGNOSIS — F1120 Opioid dependence, uncomplicated: Secondary | ICD-10-CM | POA: Diagnosis not present

## 2022-05-15 DIAGNOSIS — G8921 Chronic pain due to trauma: Secondary | ICD-10-CM | POA: Diagnosis not present

## 2022-05-15 DIAGNOSIS — K219 Gastro-esophageal reflux disease without esophagitis: Secondary | ICD-10-CM | POA: Diagnosis not present

## 2022-05-15 DIAGNOSIS — Z993 Dependence on wheelchair: Secondary | ICD-10-CM | POA: Diagnosis not present

## 2022-05-15 DIAGNOSIS — F419 Anxiety disorder, unspecified: Secondary | ICD-10-CM | POA: Diagnosis not present

## 2022-05-15 DIAGNOSIS — I10 Essential (primary) hypertension: Secondary | ICD-10-CM | POA: Diagnosis not present

## 2022-05-15 DIAGNOSIS — E119 Type 2 diabetes mellitus without complications: Secondary | ICD-10-CM | POA: Diagnosis not present

## 2022-05-15 DIAGNOSIS — Z87891 Personal history of nicotine dependence: Secondary | ICD-10-CM | POA: Diagnosis not present

## 2022-05-15 DIAGNOSIS — F32A Depression, unspecified: Secondary | ICD-10-CM | POA: Diagnosis not present

## 2022-05-15 DIAGNOSIS — J449 Chronic obstructive pulmonary disease, unspecified: Secondary | ICD-10-CM | POA: Diagnosis not present

## 2022-05-15 DIAGNOSIS — Z9181 History of falling: Secondary | ICD-10-CM | POA: Diagnosis not present

## 2022-05-16 DIAGNOSIS — R400 Somnolence: Secondary | ICD-10-CM | POA: Diagnosis not present

## 2022-05-16 DIAGNOSIS — G2581 Restless legs syndrome: Secondary | ICD-10-CM | POA: Diagnosis not present

## 2022-05-16 DIAGNOSIS — R918 Other nonspecific abnormal finding of lung field: Secondary | ICD-10-CM | POA: Diagnosis not present

## 2022-05-16 DIAGNOSIS — J301 Allergic rhinitis due to pollen: Secondary | ICD-10-CM | POA: Diagnosis not present

## 2022-05-16 DIAGNOSIS — G4737 Central sleep apnea in conditions classified elsewhere: Secondary | ICD-10-CM | POA: Diagnosis not present

## 2022-05-16 DIAGNOSIS — J449 Chronic obstructive pulmonary disease, unspecified: Secondary | ICD-10-CM | POA: Diagnosis not present

## 2022-05-16 DIAGNOSIS — R5383 Other fatigue: Secondary | ICD-10-CM | POA: Diagnosis not present

## 2022-05-22 DIAGNOSIS — F32A Depression, unspecified: Secondary | ICD-10-CM | POA: Diagnosis not present

## 2022-05-22 DIAGNOSIS — M1990 Unspecified osteoarthritis, unspecified site: Secondary | ICD-10-CM | POA: Diagnosis not present

## 2022-05-22 DIAGNOSIS — Z9181 History of falling: Secondary | ICD-10-CM | POA: Diagnosis not present

## 2022-05-22 DIAGNOSIS — E119 Type 2 diabetes mellitus without complications: Secondary | ICD-10-CM | POA: Diagnosis not present

## 2022-05-22 DIAGNOSIS — G8921 Chronic pain due to trauma: Secondary | ICD-10-CM | POA: Diagnosis not present

## 2022-05-22 DIAGNOSIS — Z7951 Long term (current) use of inhaled steroids: Secondary | ICD-10-CM | POA: Diagnosis not present

## 2022-05-22 DIAGNOSIS — Z7984 Long term (current) use of oral hypoglycemic drugs: Secondary | ICD-10-CM | POA: Diagnosis not present

## 2022-05-22 DIAGNOSIS — F419 Anxiety disorder, unspecified: Secondary | ICD-10-CM | POA: Diagnosis not present

## 2022-05-22 DIAGNOSIS — E7800 Pure hypercholesterolemia, unspecified: Secondary | ICD-10-CM | POA: Diagnosis not present

## 2022-05-22 DIAGNOSIS — E039 Hypothyroidism, unspecified: Secondary | ICD-10-CM | POA: Diagnosis not present

## 2022-05-22 DIAGNOSIS — F1120 Opioid dependence, uncomplicated: Secondary | ICD-10-CM | POA: Diagnosis not present

## 2022-05-22 DIAGNOSIS — Z931 Gastrostomy status: Secondary | ICD-10-CM | POA: Diagnosis not present

## 2022-05-22 DIAGNOSIS — Z8701 Personal history of pneumonia (recurrent): Secondary | ICD-10-CM | POA: Diagnosis not present

## 2022-05-22 DIAGNOSIS — Z87891 Personal history of nicotine dependence: Secondary | ICD-10-CM | POA: Diagnosis not present

## 2022-05-22 DIAGNOSIS — Z79899 Other long term (current) drug therapy: Secondary | ICD-10-CM | POA: Diagnosis not present

## 2022-05-22 DIAGNOSIS — J449 Chronic obstructive pulmonary disease, unspecified: Secondary | ICD-10-CM | POA: Diagnosis not present

## 2022-05-22 DIAGNOSIS — K2900 Acute gastritis without bleeding: Secondary | ICD-10-CM | POA: Diagnosis not present

## 2022-05-22 DIAGNOSIS — I10 Essential (primary) hypertension: Secondary | ICD-10-CM | POA: Diagnosis not present

## 2022-05-22 DIAGNOSIS — Z993 Dependence on wheelchair: Secondary | ICD-10-CM | POA: Diagnosis not present

## 2022-05-22 DIAGNOSIS — K219 Gastro-esophageal reflux disease without esophagitis: Secondary | ICD-10-CM | POA: Diagnosis not present

## 2022-05-22 DIAGNOSIS — Z79891 Long term (current) use of opiate analgesic: Secondary | ICD-10-CM | POA: Diagnosis not present

## 2022-05-23 DIAGNOSIS — K2900 Acute gastritis without bleeding: Secondary | ICD-10-CM | POA: Diagnosis not present

## 2022-05-23 DIAGNOSIS — Z79891 Long term (current) use of opiate analgesic: Secondary | ICD-10-CM | POA: Diagnosis not present

## 2022-05-23 DIAGNOSIS — K219 Gastro-esophageal reflux disease without esophagitis: Secondary | ICD-10-CM | POA: Diagnosis not present

## 2022-05-23 DIAGNOSIS — E039 Hypothyroidism, unspecified: Secondary | ICD-10-CM | POA: Diagnosis not present

## 2022-05-23 DIAGNOSIS — G8921 Chronic pain due to trauma: Secondary | ICD-10-CM | POA: Diagnosis not present

## 2022-05-23 DIAGNOSIS — Z931 Gastrostomy status: Secondary | ICD-10-CM | POA: Diagnosis not present

## 2022-05-23 DIAGNOSIS — M1990 Unspecified osteoarthritis, unspecified site: Secondary | ICD-10-CM | POA: Diagnosis not present

## 2022-05-23 DIAGNOSIS — I10 Essential (primary) hypertension: Secondary | ICD-10-CM | POA: Diagnosis not present

## 2022-05-23 DIAGNOSIS — F32A Depression, unspecified: Secondary | ICD-10-CM | POA: Diagnosis not present

## 2022-05-23 DIAGNOSIS — Z79899 Other long term (current) drug therapy: Secondary | ICD-10-CM | POA: Diagnosis not present

## 2022-05-23 DIAGNOSIS — Z87891 Personal history of nicotine dependence: Secondary | ICD-10-CM | POA: Diagnosis not present

## 2022-05-23 DIAGNOSIS — E7800 Pure hypercholesterolemia, unspecified: Secondary | ICD-10-CM | POA: Diagnosis not present

## 2022-05-23 DIAGNOSIS — E119 Type 2 diabetes mellitus without complications: Secondary | ICD-10-CM | POA: Diagnosis not present

## 2022-05-23 DIAGNOSIS — Z7984 Long term (current) use of oral hypoglycemic drugs: Secondary | ICD-10-CM | POA: Diagnosis not present

## 2022-05-23 DIAGNOSIS — Z993 Dependence on wheelchair: Secondary | ICD-10-CM | POA: Diagnosis not present

## 2022-05-23 DIAGNOSIS — F419 Anxiety disorder, unspecified: Secondary | ICD-10-CM | POA: Diagnosis not present

## 2022-05-23 DIAGNOSIS — Z7951 Long term (current) use of inhaled steroids: Secondary | ICD-10-CM | POA: Diagnosis not present

## 2022-05-23 DIAGNOSIS — Z8701 Personal history of pneumonia (recurrent): Secondary | ICD-10-CM | POA: Diagnosis not present

## 2022-05-23 DIAGNOSIS — Z9181 History of falling: Secondary | ICD-10-CM | POA: Diagnosis not present

## 2022-05-23 DIAGNOSIS — J449 Chronic obstructive pulmonary disease, unspecified: Secondary | ICD-10-CM | POA: Diagnosis not present

## 2022-05-23 DIAGNOSIS — F1120 Opioid dependence, uncomplicated: Secondary | ICD-10-CM | POA: Diagnosis not present

## 2022-05-26 DIAGNOSIS — E1140 Type 2 diabetes mellitus with diabetic neuropathy, unspecified: Secondary | ICD-10-CM | POA: Diagnosis not present

## 2022-05-26 DIAGNOSIS — R262 Difficulty in walking, not elsewhere classified: Secondary | ICD-10-CM | POA: Diagnosis not present

## 2022-05-26 DIAGNOSIS — M5441 Lumbago with sciatica, right side: Secondary | ICD-10-CM | POA: Diagnosis not present

## 2022-05-26 DIAGNOSIS — N390 Urinary tract infection, site not specified: Secondary | ICD-10-CM | POA: Diagnosis not present

## 2022-05-26 DIAGNOSIS — G8929 Other chronic pain: Secondary | ICD-10-CM | POA: Diagnosis not present

## 2022-05-26 DIAGNOSIS — A499 Bacterial infection, unspecified: Secondary | ICD-10-CM | POA: Diagnosis not present

## 2022-05-26 DIAGNOSIS — M5442 Lumbago with sciatica, left side: Secondary | ICD-10-CM | POA: Diagnosis not present

## 2022-05-26 DIAGNOSIS — M1991 Primary osteoarthritis, unspecified site: Secondary | ICD-10-CM | POA: Diagnosis not present

## 2022-05-26 DIAGNOSIS — Z6820 Body mass index (BMI) 20.0-20.9, adult: Secondary | ICD-10-CM | POA: Diagnosis not present

## 2022-06-05 DIAGNOSIS — H353132 Nonexudative age-related macular degeneration, bilateral, intermediate dry stage: Secondary | ICD-10-CM | POA: Diagnosis not present

## 2022-06-05 DIAGNOSIS — H5213 Myopia, bilateral: Secondary | ICD-10-CM | POA: Diagnosis not present

## 2022-06-12 DIAGNOSIS — F419 Anxiety disorder, unspecified: Secondary | ICD-10-CM | POA: Diagnosis not present

## 2022-06-12 DIAGNOSIS — Z87891 Personal history of nicotine dependence: Secondary | ICD-10-CM | POA: Diagnosis not present

## 2022-06-12 DIAGNOSIS — E7800 Pure hypercholesterolemia, unspecified: Secondary | ICD-10-CM | POA: Diagnosis not present

## 2022-06-12 DIAGNOSIS — J449 Chronic obstructive pulmonary disease, unspecified: Secondary | ICD-10-CM | POA: Diagnosis not present

## 2022-06-12 DIAGNOSIS — K2900 Acute gastritis without bleeding: Secondary | ICD-10-CM | POA: Diagnosis not present

## 2022-06-12 DIAGNOSIS — Z9181 History of falling: Secondary | ICD-10-CM | POA: Diagnosis not present

## 2022-06-12 DIAGNOSIS — Z931 Gastrostomy status: Secondary | ICD-10-CM | POA: Diagnosis not present

## 2022-06-12 DIAGNOSIS — Z993 Dependence on wheelchair: Secondary | ICD-10-CM | POA: Diagnosis not present

## 2022-06-12 DIAGNOSIS — I10 Essential (primary) hypertension: Secondary | ICD-10-CM | POA: Diagnosis not present

## 2022-06-12 DIAGNOSIS — Z79891 Long term (current) use of opiate analgesic: Secondary | ICD-10-CM | POA: Diagnosis not present

## 2022-06-12 DIAGNOSIS — G8921 Chronic pain due to trauma: Secondary | ICD-10-CM | POA: Diagnosis not present

## 2022-06-12 DIAGNOSIS — Z8701 Personal history of pneumonia (recurrent): Secondary | ICD-10-CM | POA: Diagnosis not present

## 2022-06-12 DIAGNOSIS — M1990 Unspecified osteoarthritis, unspecified site: Secondary | ICD-10-CM | POA: Diagnosis not present

## 2022-06-12 DIAGNOSIS — E119 Type 2 diabetes mellitus without complications: Secondary | ICD-10-CM | POA: Diagnosis not present

## 2022-06-12 DIAGNOSIS — E039 Hypothyroidism, unspecified: Secondary | ICD-10-CM | POA: Diagnosis not present

## 2022-06-12 DIAGNOSIS — Z7984 Long term (current) use of oral hypoglycemic drugs: Secondary | ICD-10-CM | POA: Diagnosis not present

## 2022-06-12 DIAGNOSIS — Z7951 Long term (current) use of inhaled steroids: Secondary | ICD-10-CM | POA: Diagnosis not present

## 2022-06-12 DIAGNOSIS — K219 Gastro-esophageal reflux disease without esophagitis: Secondary | ICD-10-CM | POA: Diagnosis not present

## 2022-06-12 DIAGNOSIS — F32A Depression, unspecified: Secondary | ICD-10-CM | POA: Diagnosis not present

## 2022-06-12 DIAGNOSIS — Z79899 Other long term (current) drug therapy: Secondary | ICD-10-CM | POA: Diagnosis not present

## 2022-06-12 DIAGNOSIS — F1120 Opioid dependence, uncomplicated: Secondary | ICD-10-CM | POA: Diagnosis not present

## 2022-06-18 DIAGNOSIS — Z8701 Personal history of pneumonia (recurrent): Secondary | ICD-10-CM | POA: Diagnosis not present

## 2022-06-18 DIAGNOSIS — J449 Chronic obstructive pulmonary disease, unspecified: Secondary | ICD-10-CM | POA: Diagnosis not present

## 2022-06-27 DIAGNOSIS — G8921 Chronic pain due to trauma: Secondary | ICD-10-CM | POA: Diagnosis not present

## 2022-06-27 DIAGNOSIS — F32A Depression, unspecified: Secondary | ICD-10-CM | POA: Diagnosis not present

## 2022-06-27 DIAGNOSIS — Z931 Gastrostomy status: Secondary | ICD-10-CM | POA: Diagnosis not present

## 2022-06-27 DIAGNOSIS — J449 Chronic obstructive pulmonary disease, unspecified: Secondary | ICD-10-CM | POA: Diagnosis not present

## 2022-06-27 DIAGNOSIS — Z9181 History of falling: Secondary | ICD-10-CM | POA: Diagnosis not present

## 2022-06-27 DIAGNOSIS — Z8701 Personal history of pneumonia (recurrent): Secondary | ICD-10-CM | POA: Diagnosis not present

## 2022-06-27 DIAGNOSIS — Z7984 Long term (current) use of oral hypoglycemic drugs: Secondary | ICD-10-CM | POA: Diagnosis not present

## 2022-06-27 DIAGNOSIS — M1990 Unspecified osteoarthritis, unspecified site: Secondary | ICD-10-CM | POA: Diagnosis not present

## 2022-06-27 DIAGNOSIS — Z79891 Long term (current) use of opiate analgesic: Secondary | ICD-10-CM | POA: Diagnosis not present

## 2022-06-27 DIAGNOSIS — F1120 Opioid dependence, uncomplicated: Secondary | ICD-10-CM | POA: Diagnosis not present

## 2022-06-27 DIAGNOSIS — E039 Hypothyroidism, unspecified: Secondary | ICD-10-CM | POA: Diagnosis not present

## 2022-06-27 DIAGNOSIS — I10 Essential (primary) hypertension: Secondary | ICD-10-CM | POA: Diagnosis not present

## 2022-06-27 DIAGNOSIS — K2900 Acute gastritis without bleeding: Secondary | ICD-10-CM | POA: Diagnosis not present

## 2022-06-27 DIAGNOSIS — F419 Anxiety disorder, unspecified: Secondary | ICD-10-CM | POA: Diagnosis not present

## 2022-06-27 DIAGNOSIS — Z79899 Other long term (current) drug therapy: Secondary | ICD-10-CM | POA: Diagnosis not present

## 2022-06-27 DIAGNOSIS — Z993 Dependence on wheelchair: Secondary | ICD-10-CM | POA: Diagnosis not present

## 2022-06-27 DIAGNOSIS — E119 Type 2 diabetes mellitus without complications: Secondary | ICD-10-CM | POA: Diagnosis not present

## 2022-06-27 DIAGNOSIS — K219 Gastro-esophageal reflux disease without esophagitis: Secondary | ICD-10-CM | POA: Diagnosis not present

## 2022-06-27 DIAGNOSIS — E7800 Pure hypercholesterolemia, unspecified: Secondary | ICD-10-CM | POA: Diagnosis not present

## 2022-06-27 DIAGNOSIS — Z7951 Long term (current) use of inhaled steroids: Secondary | ICD-10-CM | POA: Diagnosis not present

## 2022-06-27 DIAGNOSIS — Z87891 Personal history of nicotine dependence: Secondary | ICD-10-CM | POA: Diagnosis not present

## 2022-07-05 DIAGNOSIS — F419 Anxiety disorder, unspecified: Secondary | ICD-10-CM | POA: Diagnosis not present

## 2022-07-05 DIAGNOSIS — F32A Depression, unspecified: Secondary | ICD-10-CM | POA: Diagnosis not present

## 2022-07-05 DIAGNOSIS — J449 Chronic obstructive pulmonary disease, unspecified: Secondary | ICD-10-CM | POA: Diagnosis not present

## 2022-07-05 DIAGNOSIS — Z7951 Long term (current) use of inhaled steroids: Secondary | ICD-10-CM | POA: Diagnosis not present

## 2022-07-05 DIAGNOSIS — Z8701 Personal history of pneumonia (recurrent): Secondary | ICD-10-CM | POA: Diagnosis not present

## 2022-07-05 DIAGNOSIS — Z79891 Long term (current) use of opiate analgesic: Secondary | ICD-10-CM | POA: Diagnosis not present

## 2022-07-05 DIAGNOSIS — K2900 Acute gastritis without bleeding: Secondary | ICD-10-CM | POA: Diagnosis not present

## 2022-07-05 DIAGNOSIS — E119 Type 2 diabetes mellitus without complications: Secondary | ICD-10-CM | POA: Diagnosis not present

## 2022-07-05 DIAGNOSIS — Z87891 Personal history of nicotine dependence: Secondary | ICD-10-CM | POA: Diagnosis not present

## 2022-07-05 DIAGNOSIS — Z993 Dependence on wheelchair: Secondary | ICD-10-CM | POA: Diagnosis not present

## 2022-07-05 DIAGNOSIS — Z79899 Other long term (current) drug therapy: Secondary | ICD-10-CM | POA: Diagnosis not present

## 2022-07-05 DIAGNOSIS — Z931 Gastrostomy status: Secondary | ICD-10-CM | POA: Diagnosis not present

## 2022-07-05 DIAGNOSIS — E7800 Pure hypercholesterolemia, unspecified: Secondary | ICD-10-CM | POA: Diagnosis not present

## 2022-07-05 DIAGNOSIS — Z9181 History of falling: Secondary | ICD-10-CM | POA: Diagnosis not present

## 2022-07-05 DIAGNOSIS — M1990 Unspecified osteoarthritis, unspecified site: Secondary | ICD-10-CM | POA: Diagnosis not present

## 2022-07-05 DIAGNOSIS — G8921 Chronic pain due to trauma: Secondary | ICD-10-CM | POA: Diagnosis not present

## 2022-07-05 DIAGNOSIS — I10 Essential (primary) hypertension: Secondary | ICD-10-CM | POA: Diagnosis not present

## 2022-07-05 DIAGNOSIS — K219 Gastro-esophageal reflux disease without esophagitis: Secondary | ICD-10-CM | POA: Diagnosis not present

## 2022-07-05 DIAGNOSIS — E039 Hypothyroidism, unspecified: Secondary | ICD-10-CM | POA: Diagnosis not present

## 2022-07-05 DIAGNOSIS — Z7984 Long term (current) use of oral hypoglycemic drugs: Secondary | ICD-10-CM | POA: Diagnosis not present

## 2022-07-05 DIAGNOSIS — F1120 Opioid dependence, uncomplicated: Secondary | ICD-10-CM | POA: Diagnosis not present

## 2022-07-09 DIAGNOSIS — Z7951 Long term (current) use of inhaled steroids: Secondary | ICD-10-CM | POA: Diagnosis not present

## 2022-07-09 DIAGNOSIS — I10 Essential (primary) hypertension: Secondary | ICD-10-CM | POA: Diagnosis not present

## 2022-07-09 DIAGNOSIS — M1990 Unspecified osteoarthritis, unspecified site: Secondary | ICD-10-CM | POA: Diagnosis not present

## 2022-07-09 DIAGNOSIS — E7800 Pure hypercholesterolemia, unspecified: Secondary | ICD-10-CM | POA: Diagnosis not present

## 2022-07-09 DIAGNOSIS — Z79891 Long term (current) use of opiate analgesic: Secondary | ICD-10-CM | POA: Diagnosis not present

## 2022-07-09 DIAGNOSIS — Z79899 Other long term (current) drug therapy: Secondary | ICD-10-CM | POA: Diagnosis not present

## 2022-07-09 DIAGNOSIS — J449 Chronic obstructive pulmonary disease, unspecified: Secondary | ICD-10-CM | POA: Diagnosis not present

## 2022-07-09 DIAGNOSIS — Z931 Gastrostomy status: Secondary | ICD-10-CM | POA: Diagnosis not present

## 2022-07-09 DIAGNOSIS — K2900 Acute gastritis without bleeding: Secondary | ICD-10-CM | POA: Diagnosis not present

## 2022-07-09 DIAGNOSIS — G4733 Obstructive sleep apnea (adult) (pediatric): Secondary | ICD-10-CM | POA: Diagnosis not present

## 2022-07-09 DIAGNOSIS — Z87891 Personal history of nicotine dependence: Secondary | ICD-10-CM | POA: Diagnosis not present

## 2022-07-09 DIAGNOSIS — F419 Anxiety disorder, unspecified: Secondary | ICD-10-CM | POA: Diagnosis not present

## 2022-07-09 DIAGNOSIS — K219 Gastro-esophageal reflux disease without esophagitis: Secondary | ICD-10-CM | POA: Diagnosis not present

## 2022-07-09 DIAGNOSIS — F32A Depression, unspecified: Secondary | ICD-10-CM | POA: Diagnosis not present

## 2022-07-09 DIAGNOSIS — Z8701 Personal history of pneumonia (recurrent): Secondary | ICD-10-CM | POA: Diagnosis not present

## 2022-07-09 DIAGNOSIS — G8921 Chronic pain due to trauma: Secondary | ICD-10-CM | POA: Diagnosis not present

## 2022-07-09 DIAGNOSIS — E119 Type 2 diabetes mellitus without complications: Secondary | ICD-10-CM | POA: Diagnosis not present

## 2022-07-09 DIAGNOSIS — F1120 Opioid dependence, uncomplicated: Secondary | ICD-10-CM | POA: Diagnosis not present

## 2022-07-09 DIAGNOSIS — Z993 Dependence on wheelchair: Secondary | ICD-10-CM | POA: Diagnosis not present

## 2022-07-09 DIAGNOSIS — E039 Hypothyroidism, unspecified: Secondary | ICD-10-CM | POA: Diagnosis not present

## 2022-07-09 DIAGNOSIS — Z9181 History of falling: Secondary | ICD-10-CM | POA: Diagnosis not present

## 2022-07-09 DIAGNOSIS — Z7984 Long term (current) use of oral hypoglycemic drugs: Secondary | ICD-10-CM | POA: Diagnosis not present

## 2022-07-12 DIAGNOSIS — K2900 Acute gastritis without bleeding: Secondary | ICD-10-CM | POA: Diagnosis not present

## 2022-07-12 DIAGNOSIS — Z9181 History of falling: Secondary | ICD-10-CM | POA: Diagnosis not present

## 2022-07-12 DIAGNOSIS — Z8701 Personal history of pneumonia (recurrent): Secondary | ICD-10-CM | POA: Diagnosis not present

## 2022-07-12 DIAGNOSIS — E039 Hypothyroidism, unspecified: Secondary | ICD-10-CM | POA: Diagnosis not present

## 2022-07-12 DIAGNOSIS — Z993 Dependence on wheelchair: Secondary | ICD-10-CM | POA: Diagnosis not present

## 2022-07-12 DIAGNOSIS — J449 Chronic obstructive pulmonary disease, unspecified: Secondary | ICD-10-CM | POA: Diagnosis not present

## 2022-07-12 DIAGNOSIS — K219 Gastro-esophageal reflux disease without esophagitis: Secondary | ICD-10-CM | POA: Diagnosis not present

## 2022-07-12 DIAGNOSIS — F1120 Opioid dependence, uncomplicated: Secondary | ICD-10-CM | POA: Diagnosis not present

## 2022-07-12 DIAGNOSIS — I10 Essential (primary) hypertension: Secondary | ICD-10-CM | POA: Diagnosis not present

## 2022-07-12 DIAGNOSIS — Z87891 Personal history of nicotine dependence: Secondary | ICD-10-CM | POA: Diagnosis not present

## 2022-07-12 DIAGNOSIS — F32A Depression, unspecified: Secondary | ICD-10-CM | POA: Diagnosis not present

## 2022-07-12 DIAGNOSIS — E119 Type 2 diabetes mellitus without complications: Secondary | ICD-10-CM | POA: Diagnosis not present

## 2022-07-12 DIAGNOSIS — M1990 Unspecified osteoarthritis, unspecified site: Secondary | ICD-10-CM | POA: Diagnosis not present

## 2022-07-12 DIAGNOSIS — Z79899 Other long term (current) drug therapy: Secondary | ICD-10-CM | POA: Diagnosis not present

## 2022-07-12 DIAGNOSIS — Z79891 Long term (current) use of opiate analgesic: Secondary | ICD-10-CM | POA: Diagnosis not present

## 2022-07-12 DIAGNOSIS — E7800 Pure hypercholesterolemia, unspecified: Secondary | ICD-10-CM | POA: Diagnosis not present

## 2022-07-12 DIAGNOSIS — F419 Anxiety disorder, unspecified: Secondary | ICD-10-CM | POA: Diagnosis not present

## 2022-07-12 DIAGNOSIS — Z931 Gastrostomy status: Secondary | ICD-10-CM | POA: Diagnosis not present

## 2022-07-12 DIAGNOSIS — G8921 Chronic pain due to trauma: Secondary | ICD-10-CM | POA: Diagnosis not present

## 2022-07-12 DIAGNOSIS — Z7951 Long term (current) use of inhaled steroids: Secondary | ICD-10-CM | POA: Diagnosis not present

## 2022-07-12 DIAGNOSIS — Z7984 Long term (current) use of oral hypoglycemic drugs: Secondary | ICD-10-CM | POA: Diagnosis not present

## 2022-07-16 DIAGNOSIS — M2170 Unequal limb length (acquired), unspecified site: Secondary | ICD-10-CM | POA: Diagnosis not present

## 2022-07-16 DIAGNOSIS — D692 Other nonthrombocytopenic purpura: Secondary | ICD-10-CM | POA: Diagnosis not present

## 2022-07-16 DIAGNOSIS — Z9181 History of falling: Secondary | ICD-10-CM | POA: Diagnosis not present

## 2022-07-16 DIAGNOSIS — Z9889 Other specified postprocedural states: Secondary | ICD-10-CM | POA: Diagnosis not present

## 2022-07-16 DIAGNOSIS — Z7951 Long term (current) use of inhaled steroids: Secondary | ICD-10-CM | POA: Diagnosis not present

## 2022-07-16 DIAGNOSIS — Z8669 Personal history of other diseases of the nervous system and sense organs: Secondary | ICD-10-CM | POA: Diagnosis not present

## 2022-07-16 DIAGNOSIS — J449 Chronic obstructive pulmonary disease, unspecified: Secondary | ICD-10-CM | POA: Diagnosis not present

## 2022-07-16 DIAGNOSIS — Z515 Encounter for palliative care: Secondary | ICD-10-CM | POA: Diagnosis not present

## 2022-07-24 DIAGNOSIS — Z79899 Other long term (current) drug therapy: Secondary | ICD-10-CM | POA: Diagnosis not present

## 2022-07-24 DIAGNOSIS — K2900 Acute gastritis without bleeding: Secondary | ICD-10-CM | POA: Diagnosis not present

## 2022-07-24 DIAGNOSIS — G8921 Chronic pain due to trauma: Secondary | ICD-10-CM | POA: Diagnosis not present

## 2022-07-24 DIAGNOSIS — I10 Essential (primary) hypertension: Secondary | ICD-10-CM | POA: Diagnosis not present

## 2022-07-24 DIAGNOSIS — Z7984 Long term (current) use of oral hypoglycemic drugs: Secondary | ICD-10-CM | POA: Diagnosis not present

## 2022-07-24 DIAGNOSIS — Z9181 History of falling: Secondary | ICD-10-CM | POA: Diagnosis not present

## 2022-07-24 DIAGNOSIS — Z79891 Long term (current) use of opiate analgesic: Secondary | ICD-10-CM | POA: Diagnosis not present

## 2022-07-24 DIAGNOSIS — F419 Anxiety disorder, unspecified: Secondary | ICD-10-CM | POA: Diagnosis not present

## 2022-07-24 DIAGNOSIS — Z7951 Long term (current) use of inhaled steroids: Secondary | ICD-10-CM | POA: Diagnosis not present

## 2022-07-24 DIAGNOSIS — J449 Chronic obstructive pulmonary disease, unspecified: Secondary | ICD-10-CM | POA: Diagnosis not present

## 2022-07-24 DIAGNOSIS — M1990 Unspecified osteoarthritis, unspecified site: Secondary | ICD-10-CM | POA: Diagnosis not present

## 2022-07-24 DIAGNOSIS — F32A Depression, unspecified: Secondary | ICD-10-CM | POA: Diagnosis not present

## 2022-07-24 DIAGNOSIS — Z993 Dependence on wheelchair: Secondary | ICD-10-CM | POA: Diagnosis not present

## 2022-07-24 DIAGNOSIS — Z87891 Personal history of nicotine dependence: Secondary | ICD-10-CM | POA: Diagnosis not present

## 2022-07-24 DIAGNOSIS — K219 Gastro-esophageal reflux disease without esophagitis: Secondary | ICD-10-CM | POA: Diagnosis not present

## 2022-07-24 DIAGNOSIS — F1120 Opioid dependence, uncomplicated: Secondary | ICD-10-CM | POA: Diagnosis not present

## 2022-07-24 DIAGNOSIS — E7800 Pure hypercholesterolemia, unspecified: Secondary | ICD-10-CM | POA: Diagnosis not present

## 2022-07-24 DIAGNOSIS — E119 Type 2 diabetes mellitus without complications: Secondary | ICD-10-CM | POA: Diagnosis not present

## 2022-07-24 DIAGNOSIS — E039 Hypothyroidism, unspecified: Secondary | ICD-10-CM | POA: Diagnosis not present

## 2022-07-24 DIAGNOSIS — Z931 Gastrostomy status: Secondary | ICD-10-CM | POA: Diagnosis not present

## 2022-07-24 DIAGNOSIS — Z8701 Personal history of pneumonia (recurrent): Secondary | ICD-10-CM | POA: Diagnosis not present

## 2022-08-09 DIAGNOSIS — G4733 Obstructive sleep apnea (adult) (pediatric): Secondary | ICD-10-CM | POA: Diagnosis not present

## 2022-08-12 DIAGNOSIS — R5383 Other fatigue: Secondary | ICD-10-CM | POA: Diagnosis not present

## 2022-08-12 DIAGNOSIS — R918 Other nonspecific abnormal finding of lung field: Secondary | ICD-10-CM | POA: Diagnosis not present

## 2022-08-12 DIAGNOSIS — G4737 Central sleep apnea in conditions classified elsewhere: Secondary | ICD-10-CM | POA: Diagnosis not present

## 2022-08-12 DIAGNOSIS — G2581 Restless legs syndrome: Secondary | ICD-10-CM | POA: Diagnosis not present

## 2022-08-12 DIAGNOSIS — R400 Somnolence: Secondary | ICD-10-CM | POA: Diagnosis not present

## 2022-08-12 DIAGNOSIS — J449 Chronic obstructive pulmonary disease, unspecified: Secondary | ICD-10-CM | POA: Diagnosis not present

## 2022-08-12 DIAGNOSIS — J301 Allergic rhinitis due to pollen: Secondary | ICD-10-CM | POA: Diagnosis not present

## 2022-08-18 DIAGNOSIS — R400 Somnolence: Secondary | ICD-10-CM | POA: Diagnosis not present

## 2022-08-18 DIAGNOSIS — R5383 Other fatigue: Secondary | ICD-10-CM | POA: Diagnosis not present

## 2022-08-18 DIAGNOSIS — G4737 Central sleep apnea in conditions classified elsewhere: Secondary | ICD-10-CM | POA: Diagnosis not present

## 2022-08-18 DIAGNOSIS — G2581 Restless legs syndrome: Secondary | ICD-10-CM | POA: Diagnosis not present

## 2022-08-18 DIAGNOSIS — J301 Allergic rhinitis due to pollen: Secondary | ICD-10-CM | POA: Diagnosis not present

## 2022-08-18 DIAGNOSIS — J449 Chronic obstructive pulmonary disease, unspecified: Secondary | ICD-10-CM | POA: Diagnosis not present

## 2022-08-18 DIAGNOSIS — R051 Acute cough: Secondary | ICD-10-CM | POA: Diagnosis not present

## 2022-08-18 DIAGNOSIS — R918 Other nonspecific abnormal finding of lung field: Secondary | ICD-10-CM | POA: Diagnosis not present

## 2022-08-18 DIAGNOSIS — Z03818 Encounter for observation for suspected exposure to other biological agents ruled out: Secondary | ICD-10-CM | POA: Diagnosis not present

## 2022-08-19 DIAGNOSIS — J189 Pneumonia, unspecified organism: Secondary | ICD-10-CM | POA: Diagnosis not present

## 2022-08-19 DIAGNOSIS — R918 Other nonspecific abnormal finding of lung field: Secondary | ICD-10-CM | POA: Diagnosis not present

## 2022-08-19 DIAGNOSIS — J449 Chronic obstructive pulmonary disease, unspecified: Secondary | ICD-10-CM | POA: Diagnosis not present

## 2022-08-19 DIAGNOSIS — J301 Allergic rhinitis due to pollen: Secondary | ICD-10-CM | POA: Diagnosis not present

## 2022-08-20 DIAGNOSIS — R918 Other nonspecific abnormal finding of lung field: Secondary | ICD-10-CM | POA: Diagnosis not present

## 2022-08-20 DIAGNOSIS — J301 Allergic rhinitis due to pollen: Secondary | ICD-10-CM | POA: Diagnosis not present

## 2022-08-20 DIAGNOSIS — J449 Chronic obstructive pulmonary disease, unspecified: Secondary | ICD-10-CM | POA: Diagnosis not present

## 2022-08-21 DIAGNOSIS — J449 Chronic obstructive pulmonary disease, unspecified: Secondary | ICD-10-CM | POA: Diagnosis not present

## 2022-08-21 DIAGNOSIS — R918 Other nonspecific abnormal finding of lung field: Secondary | ICD-10-CM | POA: Diagnosis not present

## 2022-08-21 DIAGNOSIS — J301 Allergic rhinitis due to pollen: Secondary | ICD-10-CM | POA: Diagnosis not present

## 2022-08-22 DIAGNOSIS — J301 Allergic rhinitis due to pollen: Secondary | ICD-10-CM | POA: Diagnosis not present

## 2022-08-22 DIAGNOSIS — J449 Chronic obstructive pulmonary disease, unspecified: Secondary | ICD-10-CM | POA: Diagnosis not present

## 2022-08-22 DIAGNOSIS — R918 Other nonspecific abnormal finding of lung field: Secondary | ICD-10-CM | POA: Diagnosis not present

## 2022-08-26 DIAGNOSIS — R918 Other nonspecific abnormal finding of lung field: Secondary | ICD-10-CM | POA: Diagnosis not present

## 2022-08-26 DIAGNOSIS — J9811 Atelectasis: Secondary | ICD-10-CM | POA: Diagnosis not present

## 2022-08-27 DIAGNOSIS — G4737 Central sleep apnea in conditions classified elsewhere: Secondary | ICD-10-CM | POA: Diagnosis not present

## 2022-08-27 DIAGNOSIS — R5383 Other fatigue: Secondary | ICD-10-CM | POA: Diagnosis not present

## 2022-08-27 DIAGNOSIS — J449 Chronic obstructive pulmonary disease, unspecified: Secondary | ICD-10-CM | POA: Diagnosis not present

## 2022-08-27 DIAGNOSIS — R400 Somnolence: Secondary | ICD-10-CM | POA: Diagnosis not present

## 2022-08-27 DIAGNOSIS — R918 Other nonspecific abnormal finding of lung field: Secondary | ICD-10-CM | POA: Diagnosis not present

## 2022-08-27 DIAGNOSIS — G2581 Restless legs syndrome: Secondary | ICD-10-CM | POA: Diagnosis not present

## 2022-08-27 DIAGNOSIS — R053 Chronic cough: Secondary | ICD-10-CM | POA: Diagnosis not present

## 2022-08-27 DIAGNOSIS — J301 Allergic rhinitis due to pollen: Secondary | ICD-10-CM | POA: Diagnosis not present

## 2022-08-27 DIAGNOSIS — Z23 Encounter for immunization: Secondary | ICD-10-CM | POA: Diagnosis not present

## 2022-09-03 DIAGNOSIS — M79671 Pain in right foot: Secondary | ICD-10-CM | POA: Diagnosis not present

## 2022-09-03 DIAGNOSIS — W1830XA Fall on same level, unspecified, initial encounter: Secondary | ICD-10-CM | POA: Diagnosis not present

## 2022-09-03 DIAGNOSIS — M25561 Pain in right knee: Secondary | ICD-10-CM | POA: Diagnosis not present

## 2022-09-08 DIAGNOSIS — G4733 Obstructive sleep apnea (adult) (pediatric): Secondary | ICD-10-CM | POA: Diagnosis not present

## 2022-09-16 DIAGNOSIS — Z8701 Personal history of pneumonia (recurrent): Secondary | ICD-10-CM | POA: Diagnosis not present

## 2022-09-16 DIAGNOSIS — Z09 Encounter for follow-up examination after completed treatment for conditions other than malignant neoplasm: Secondary | ICD-10-CM | POA: Diagnosis not present

## 2022-09-17 DIAGNOSIS — J029 Acute pharyngitis, unspecified: Secondary | ICD-10-CM | POA: Diagnosis not present

## 2022-09-17 DIAGNOSIS — J0100 Acute maxillary sinusitis, unspecified: Secondary | ICD-10-CM | POA: Diagnosis not present

## 2022-09-17 DIAGNOSIS — H6691 Otitis media, unspecified, right ear: Secondary | ICD-10-CM | POA: Diagnosis not present

## 2022-09-23 DIAGNOSIS — G4737 Central sleep apnea in conditions classified elsewhere: Secondary | ICD-10-CM | POA: Diagnosis not present

## 2022-09-23 DIAGNOSIS — R5383 Other fatigue: Secondary | ICD-10-CM | POA: Diagnosis not present

## 2022-09-23 DIAGNOSIS — J301 Allergic rhinitis due to pollen: Secondary | ICD-10-CM | POA: Diagnosis not present

## 2022-09-23 DIAGNOSIS — R400 Somnolence: Secondary | ICD-10-CM | POA: Diagnosis not present

## 2022-09-23 DIAGNOSIS — R918 Other nonspecific abnormal finding of lung field: Secondary | ICD-10-CM | POA: Diagnosis not present

## 2022-09-23 DIAGNOSIS — J449 Chronic obstructive pulmonary disease, unspecified: Secondary | ICD-10-CM | POA: Diagnosis not present

## 2022-09-23 DIAGNOSIS — G2581 Restless legs syndrome: Secondary | ICD-10-CM | POA: Diagnosis not present

## 2022-10-07 DIAGNOSIS — R400 Somnolence: Secondary | ICD-10-CM | POA: Diagnosis not present

## 2022-10-07 DIAGNOSIS — G2581 Restless legs syndrome: Secondary | ICD-10-CM | POA: Diagnosis not present

## 2022-10-07 DIAGNOSIS — G4737 Central sleep apnea in conditions classified elsewhere: Secondary | ICD-10-CM | POA: Diagnosis not present

## 2022-10-07 DIAGNOSIS — J449 Chronic obstructive pulmonary disease, unspecified: Secondary | ICD-10-CM | POA: Diagnosis not present

## 2022-10-07 DIAGNOSIS — J301 Allergic rhinitis due to pollen: Secondary | ICD-10-CM | POA: Diagnosis not present

## 2022-10-07 DIAGNOSIS — R918 Other nonspecific abnormal finding of lung field: Secondary | ICD-10-CM | POA: Diagnosis not present

## 2022-10-07 DIAGNOSIS — R5383 Other fatigue: Secondary | ICD-10-CM | POA: Diagnosis not present

## 2022-10-09 DIAGNOSIS — G4733 Obstructive sleep apnea (adult) (pediatric): Secondary | ICD-10-CM | POA: Diagnosis not present

## 2022-10-10 DIAGNOSIS — J301 Allergic rhinitis due to pollen: Secondary | ICD-10-CM | POA: Diagnosis not present

## 2022-10-10 DIAGNOSIS — R5383 Other fatigue: Secondary | ICD-10-CM | POA: Diagnosis not present

## 2022-10-10 DIAGNOSIS — J449 Chronic obstructive pulmonary disease, unspecified: Secondary | ICD-10-CM | POA: Diagnosis not present

## 2022-10-10 DIAGNOSIS — J189 Pneumonia, unspecified organism: Secondary | ICD-10-CM | POA: Diagnosis not present

## 2022-10-10 DIAGNOSIS — R400 Somnolence: Secondary | ICD-10-CM | POA: Diagnosis not present

## 2022-10-10 DIAGNOSIS — G4737 Central sleep apnea in conditions classified elsewhere: Secondary | ICD-10-CM | POA: Diagnosis not present

## 2022-10-10 DIAGNOSIS — G2581 Restless legs syndrome: Secondary | ICD-10-CM | POA: Diagnosis not present

## 2022-10-10 DIAGNOSIS — R051 Acute cough: Secondary | ICD-10-CM | POA: Diagnosis not present

## 2022-10-10 DIAGNOSIS — R918 Other nonspecific abnormal finding of lung field: Secondary | ICD-10-CM | POA: Diagnosis not present

## 2022-10-10 DIAGNOSIS — D839 Common variable immunodeficiency, unspecified: Secondary | ICD-10-CM | POA: Diagnosis not present

## 2022-10-10 DIAGNOSIS — A4902 Methicillin resistant Staphylococcus aureus infection, unspecified site: Secondary | ICD-10-CM | POA: Diagnosis not present

## 2022-10-13 DIAGNOSIS — R5383 Other fatigue: Secondary | ICD-10-CM | POA: Diagnosis not present

## 2022-10-13 DIAGNOSIS — R918 Other nonspecific abnormal finding of lung field: Secondary | ICD-10-CM | POA: Diagnosis not present

## 2022-10-13 DIAGNOSIS — J449 Chronic obstructive pulmonary disease, unspecified: Secondary | ICD-10-CM | POA: Diagnosis not present

## 2022-10-13 DIAGNOSIS — D839 Common variable immunodeficiency, unspecified: Secondary | ICD-10-CM | POA: Diagnosis not present

## 2022-10-13 DIAGNOSIS — G4737 Central sleep apnea in conditions classified elsewhere: Secondary | ICD-10-CM | POA: Diagnosis not present

## 2022-10-13 DIAGNOSIS — R400 Somnolence: Secondary | ICD-10-CM | POA: Diagnosis not present

## 2022-10-13 DIAGNOSIS — G2581 Restless legs syndrome: Secondary | ICD-10-CM | POA: Diagnosis not present

## 2022-10-13 DIAGNOSIS — J301 Allergic rhinitis due to pollen: Secondary | ICD-10-CM | POA: Diagnosis not present

## 2022-10-17 DIAGNOSIS — G4733 Obstructive sleep apnea (adult) (pediatric): Secondary | ICD-10-CM | POA: Diagnosis not present

## 2022-10-17 DIAGNOSIS — I6529 Occlusion and stenosis of unspecified carotid artery: Secondary | ICD-10-CM | POA: Diagnosis not present

## 2022-10-17 DIAGNOSIS — J989 Respiratory disorder, unspecified: Secondary | ICD-10-CM | POA: Diagnosis not present

## 2022-10-17 DIAGNOSIS — I4510 Unspecified right bundle-branch block: Secondary | ICD-10-CM | POA: Diagnosis not present

## 2022-10-17 DIAGNOSIS — I340 Nonrheumatic mitral (valve) insufficiency: Secondary | ICD-10-CM | POA: Diagnosis not present

## 2022-10-17 DIAGNOSIS — E7849 Other hyperlipidemia: Secondary | ICD-10-CM | POA: Diagnosis not present

## 2022-11-03 DIAGNOSIS — M12811 Other specific arthropathies, not elsewhere classified, right shoulder: Secondary | ICD-10-CM | POA: Diagnosis not present

## 2022-11-03 DIAGNOSIS — Z23 Encounter for immunization: Secondary | ICD-10-CM | POA: Diagnosis not present

## 2022-11-03 DIAGNOSIS — M1991 Primary osteoarthritis, unspecified site: Secondary | ICD-10-CM | POA: Diagnosis not present

## 2022-11-03 DIAGNOSIS — Z6823 Body mass index (BMI) 23.0-23.9, adult: Secondary | ICD-10-CM | POA: Diagnosis not present

## 2022-11-03 DIAGNOSIS — E1140 Type 2 diabetes mellitus with diabetic neuropathy, unspecified: Secondary | ICD-10-CM | POA: Diagnosis not present

## 2022-11-06 DIAGNOSIS — M19011 Primary osteoarthritis, right shoulder: Secondary | ICD-10-CM | POA: Diagnosis not present

## 2022-11-06 DIAGNOSIS — M79631 Pain in right forearm: Secondary | ICD-10-CM | POA: Diagnosis not present

## 2022-11-06 DIAGNOSIS — J479 Bronchiectasis, uncomplicated: Secondary | ICD-10-CM | POA: Diagnosis not present

## 2022-11-06 DIAGNOSIS — R918 Other nonspecific abnormal finding of lung field: Secondary | ICD-10-CM | POA: Diagnosis not present

## 2022-11-06 DIAGNOSIS — M25511 Pain in right shoulder: Secondary | ICD-10-CM | POA: Diagnosis not present

## 2022-11-08 DIAGNOSIS — G4733 Obstructive sleep apnea (adult) (pediatric): Secondary | ICD-10-CM | POA: Diagnosis not present

## 2022-11-10 DIAGNOSIS — J301 Allergic rhinitis due to pollen: Secondary | ICD-10-CM | POA: Diagnosis not present

## 2022-11-10 DIAGNOSIS — D839 Common variable immunodeficiency, unspecified: Secondary | ICD-10-CM | POA: Diagnosis not present

## 2022-11-10 DIAGNOSIS — G2581 Restless legs syndrome: Secondary | ICD-10-CM | POA: Diagnosis not present

## 2022-11-10 DIAGNOSIS — G4737 Central sleep apnea in conditions classified elsewhere: Secondary | ICD-10-CM | POA: Diagnosis not present

## 2022-11-10 DIAGNOSIS — R918 Other nonspecific abnormal finding of lung field: Secondary | ICD-10-CM | POA: Diagnosis not present

## 2022-11-10 DIAGNOSIS — J449 Chronic obstructive pulmonary disease, unspecified: Secondary | ICD-10-CM | POA: Diagnosis not present

## 2022-11-10 DIAGNOSIS — R5383 Other fatigue: Secondary | ICD-10-CM | POA: Diagnosis not present

## 2022-11-10 DIAGNOSIS — R400 Somnolence: Secondary | ICD-10-CM | POA: Diagnosis not present

## 2022-11-11 DIAGNOSIS — M79631 Pain in right forearm: Secondary | ICD-10-CM | POA: Diagnosis not present

## 2022-11-11 DIAGNOSIS — M25511 Pain in right shoulder: Secondary | ICD-10-CM | POA: Diagnosis not present

## 2022-11-11 DIAGNOSIS — M19011 Primary osteoarthritis, right shoulder: Secondary | ICD-10-CM | POA: Diagnosis not present

## 2022-11-13 DIAGNOSIS — M25511 Pain in right shoulder: Secondary | ICD-10-CM | POA: Diagnosis not present

## 2022-11-13 DIAGNOSIS — M79631 Pain in right forearm: Secondary | ICD-10-CM | POA: Diagnosis not present

## 2022-11-13 DIAGNOSIS — M19011 Primary osteoarthritis, right shoulder: Secondary | ICD-10-CM | POA: Diagnosis not present

## 2022-11-14 DIAGNOSIS — E119 Type 2 diabetes mellitus without complications: Secondary | ICD-10-CM | POA: Diagnosis not present

## 2022-11-14 DIAGNOSIS — Z7984 Long term (current) use of oral hypoglycemic drugs: Secondary | ICD-10-CM | POA: Diagnosis not present

## 2022-11-14 DIAGNOSIS — Z515 Encounter for palliative care: Secondary | ICD-10-CM | POA: Diagnosis not present

## 2022-11-14 DIAGNOSIS — J449 Chronic obstructive pulmonary disease, unspecified: Secondary | ICD-10-CM | POA: Diagnosis not present

## 2022-11-14 DIAGNOSIS — G4733 Obstructive sleep apnea (adult) (pediatric): Secondary | ICD-10-CM | POA: Diagnosis not present

## 2022-11-14 DIAGNOSIS — Z9181 History of falling: Secondary | ICD-10-CM | POA: Diagnosis not present

## 2022-11-14 DIAGNOSIS — Z7951 Long term (current) use of inhaled steroids: Secondary | ICD-10-CM | POA: Diagnosis not present

## 2022-11-18 DIAGNOSIS — M19011 Primary osteoarthritis, right shoulder: Secondary | ICD-10-CM | POA: Diagnosis not present

## 2022-11-18 DIAGNOSIS — M25511 Pain in right shoulder: Secondary | ICD-10-CM | POA: Diagnosis not present

## 2022-11-18 DIAGNOSIS — M79631 Pain in right forearm: Secondary | ICD-10-CM | POA: Diagnosis not present

## 2022-11-20 DIAGNOSIS — M19011 Primary osteoarthritis, right shoulder: Secondary | ICD-10-CM | POA: Diagnosis not present

## 2022-11-20 DIAGNOSIS — M25511 Pain in right shoulder: Secondary | ICD-10-CM | POA: Diagnosis not present

## 2022-11-20 DIAGNOSIS — M79631 Pain in right forearm: Secondary | ICD-10-CM | POA: Diagnosis not present

## 2022-11-25 DIAGNOSIS — M25511 Pain in right shoulder: Secondary | ICD-10-CM | POA: Diagnosis not present

## 2022-11-25 DIAGNOSIS — M19011 Primary osteoarthritis, right shoulder: Secondary | ICD-10-CM | POA: Diagnosis not present

## 2022-11-25 DIAGNOSIS — M79631 Pain in right forearm: Secondary | ICD-10-CM | POA: Diagnosis not present

## 2022-11-27 DIAGNOSIS — M19011 Primary osteoarthritis, right shoulder: Secondary | ICD-10-CM | POA: Diagnosis not present

## 2022-11-27 DIAGNOSIS — M25511 Pain in right shoulder: Secondary | ICD-10-CM | POA: Diagnosis not present

## 2022-11-27 DIAGNOSIS — M79631 Pain in right forearm: Secondary | ICD-10-CM | POA: Diagnosis not present

## 2022-12-02 DIAGNOSIS — R0600 Dyspnea, unspecified: Secondary | ICD-10-CM | POA: Diagnosis not present

## 2022-12-04 DIAGNOSIS — M25511 Pain in right shoulder: Secondary | ICD-10-CM | POA: Diagnosis not present

## 2022-12-04 DIAGNOSIS — M79631 Pain in right forearm: Secondary | ICD-10-CM | POA: Diagnosis not present

## 2022-12-04 DIAGNOSIS — M19011 Primary osteoarthritis, right shoulder: Secondary | ICD-10-CM | POA: Diagnosis not present

## 2022-12-09 DIAGNOSIS — R5383 Other fatigue: Secondary | ICD-10-CM | POA: Diagnosis not present

## 2022-12-09 DIAGNOSIS — R918 Other nonspecific abnormal finding of lung field: Secondary | ICD-10-CM | POA: Diagnosis not present

## 2022-12-09 DIAGNOSIS — G2581 Restless legs syndrome: Secondary | ICD-10-CM | POA: Diagnosis not present

## 2022-12-09 DIAGNOSIS — R400 Somnolence: Secondary | ICD-10-CM | POA: Diagnosis not present

## 2022-12-09 DIAGNOSIS — J449 Chronic obstructive pulmonary disease, unspecified: Secondary | ICD-10-CM | POA: Diagnosis not present

## 2022-12-09 DIAGNOSIS — D839 Common variable immunodeficiency, unspecified: Secondary | ICD-10-CM | POA: Diagnosis not present

## 2022-12-09 DIAGNOSIS — J301 Allergic rhinitis due to pollen: Secondary | ICD-10-CM | POA: Diagnosis not present

## 2022-12-09 DIAGNOSIS — G4737 Central sleep apnea in conditions classified elsewhere: Secondary | ICD-10-CM | POA: Diagnosis not present

## 2022-12-09 DIAGNOSIS — G4733 Obstructive sleep apnea (adult) (pediatric): Secondary | ICD-10-CM | POA: Diagnosis not present

## 2022-12-11 DIAGNOSIS — M79631 Pain in right forearm: Secondary | ICD-10-CM | POA: Diagnosis not present

## 2022-12-11 DIAGNOSIS — M19011 Primary osteoarthritis, right shoulder: Secondary | ICD-10-CM | POA: Diagnosis not present

## 2022-12-11 DIAGNOSIS — M25511 Pain in right shoulder: Secondary | ICD-10-CM | POA: Diagnosis not present

## 2022-12-16 DIAGNOSIS — M79631 Pain in right forearm: Secondary | ICD-10-CM | POA: Diagnosis not present

## 2022-12-16 DIAGNOSIS — M25511 Pain in right shoulder: Secondary | ICD-10-CM | POA: Diagnosis not present

## 2022-12-16 DIAGNOSIS — M19011 Primary osteoarthritis, right shoulder: Secondary | ICD-10-CM | POA: Diagnosis not present

## 2022-12-18 DIAGNOSIS — M25511 Pain in right shoulder: Secondary | ICD-10-CM | POA: Diagnosis not present

## 2022-12-18 DIAGNOSIS — M79631 Pain in right forearm: Secondary | ICD-10-CM | POA: Diagnosis not present

## 2022-12-18 DIAGNOSIS — M19011 Primary osteoarthritis, right shoulder: Secondary | ICD-10-CM | POA: Diagnosis not present

## 2022-12-31 DIAGNOSIS — G4733 Obstructive sleep apnea (adult) (pediatric): Secondary | ICD-10-CM | POA: Diagnosis not present

## 2023-01-09 DIAGNOSIS — G4733 Obstructive sleep apnea (adult) (pediatric): Secondary | ICD-10-CM | POA: Diagnosis not present

## 2023-02-07 DIAGNOSIS — G4733 Obstructive sleep apnea (adult) (pediatric): Secondary | ICD-10-CM | POA: Diagnosis not present

## 2023-02-10 DIAGNOSIS — R918 Other nonspecific abnormal finding of lung field: Secondary | ICD-10-CM | POA: Diagnosis not present

## 2023-02-10 DIAGNOSIS — G2581 Restless legs syndrome: Secondary | ICD-10-CM | POA: Diagnosis not present

## 2023-02-10 DIAGNOSIS — J449 Chronic obstructive pulmonary disease, unspecified: Secondary | ICD-10-CM | POA: Diagnosis not present

## 2023-02-10 DIAGNOSIS — D839 Common variable immunodeficiency, unspecified: Secondary | ICD-10-CM | POA: Diagnosis not present

## 2023-02-10 DIAGNOSIS — Z23 Encounter for immunization: Secondary | ICD-10-CM | POA: Diagnosis not present

## 2023-02-10 DIAGNOSIS — Z87891 Personal history of nicotine dependence: Secondary | ICD-10-CM | POA: Diagnosis not present

## 2023-02-10 DIAGNOSIS — R400 Somnolence: Secondary | ICD-10-CM | POA: Diagnosis not present

## 2023-02-10 DIAGNOSIS — R5383 Other fatigue: Secondary | ICD-10-CM | POA: Diagnosis not present

## 2023-02-10 DIAGNOSIS — J301 Allergic rhinitis due to pollen: Secondary | ICD-10-CM | POA: Diagnosis not present

## 2023-02-10 DIAGNOSIS — G4737 Central sleep apnea in conditions classified elsewhere: Secondary | ICD-10-CM | POA: Diagnosis not present

## 2023-02-16 DIAGNOSIS — E039 Hypothyroidism, unspecified: Secondary | ICD-10-CM | POA: Diagnosis not present

## 2023-02-16 DIAGNOSIS — K2960 Other gastritis without bleeding: Secondary | ICD-10-CM | POA: Diagnosis not present

## 2023-02-16 DIAGNOSIS — G8929 Other chronic pain: Secondary | ICD-10-CM | POA: Diagnosis not present

## 2023-02-16 DIAGNOSIS — E785 Hyperlipidemia, unspecified: Secondary | ICD-10-CM | POA: Diagnosis not present

## 2023-02-16 DIAGNOSIS — M1991 Primary osteoarthritis, unspecified site: Secondary | ICD-10-CM | POA: Diagnosis not present

## 2023-02-16 DIAGNOSIS — E1140 Type 2 diabetes mellitus with diabetic neuropathy, unspecified: Secondary | ICD-10-CM | POA: Diagnosis not present

## 2023-02-16 DIAGNOSIS — Z0000 Encounter for general adult medical examination without abnormal findings: Secondary | ICD-10-CM | POA: Diagnosis not present

## 2023-02-16 DIAGNOSIS — M5442 Lumbago with sciatica, left side: Secondary | ICD-10-CM | POA: Diagnosis not present

## 2023-02-16 DIAGNOSIS — M5441 Lumbago with sciatica, right side: Secondary | ICD-10-CM | POA: Diagnosis not present

## 2023-02-16 DIAGNOSIS — Z79899 Other long term (current) drug therapy: Secondary | ICD-10-CM | POA: Diagnosis not present

## 2023-02-16 DIAGNOSIS — E113293 Type 2 diabetes mellitus with mild nonproliferative diabetic retinopathy without macular edema, bilateral: Secondary | ICD-10-CM | POA: Diagnosis not present

## 2023-02-16 DIAGNOSIS — F331 Major depressive disorder, recurrent, moderate: Secondary | ICD-10-CM | POA: Diagnosis not present

## 2023-02-18 DIAGNOSIS — E538 Deficiency of other specified B group vitamins: Secondary | ICD-10-CM | POA: Diagnosis not present

## 2023-02-25 DIAGNOSIS — D519 Vitamin B12 deficiency anemia, unspecified: Secondary | ICD-10-CM | POA: Diagnosis not present

## 2023-03-04 DIAGNOSIS — D519 Vitamin B12 deficiency anemia, unspecified: Secondary | ICD-10-CM | POA: Diagnosis not present

## 2023-03-10 DIAGNOSIS — J301 Allergic rhinitis due to pollen: Secondary | ICD-10-CM | POA: Diagnosis not present

## 2023-03-10 DIAGNOSIS — G4737 Central sleep apnea in conditions classified elsewhere: Secondary | ICD-10-CM | POA: Diagnosis not present

## 2023-03-10 DIAGNOSIS — R400 Somnolence: Secondary | ICD-10-CM | POA: Diagnosis not present

## 2023-03-10 DIAGNOSIS — G4733 Obstructive sleep apnea (adult) (pediatric): Secondary | ICD-10-CM | POA: Diagnosis not present

## 2023-03-10 DIAGNOSIS — J449 Chronic obstructive pulmonary disease, unspecified: Secondary | ICD-10-CM | POA: Diagnosis not present

## 2023-03-10 DIAGNOSIS — R5383 Other fatigue: Secondary | ICD-10-CM | POA: Diagnosis not present

## 2023-03-10 DIAGNOSIS — G2581 Restless legs syndrome: Secondary | ICD-10-CM | POA: Diagnosis not present

## 2023-03-10 DIAGNOSIS — Z87891 Personal history of nicotine dependence: Secondary | ICD-10-CM | POA: Diagnosis not present

## 2023-03-10 DIAGNOSIS — R918 Other nonspecific abnormal finding of lung field: Secondary | ICD-10-CM | POA: Diagnosis not present

## 2023-03-10 DIAGNOSIS — D839 Common variable immunodeficiency, unspecified: Secondary | ICD-10-CM | POA: Diagnosis not present

## 2023-03-12 DIAGNOSIS — E538 Deficiency of other specified B group vitamins: Secondary | ICD-10-CM | POA: Diagnosis not present

## 2023-03-12 DIAGNOSIS — H04123 Dry eye syndrome of bilateral lacrimal glands: Secondary | ICD-10-CM | POA: Diagnosis not present

## 2023-03-12 DIAGNOSIS — H35372 Puckering of macula, left eye: Secondary | ICD-10-CM | POA: Diagnosis not present

## 2023-03-12 DIAGNOSIS — H3589 Other specified retinal disorders: Secondary | ICD-10-CM | POA: Diagnosis not present

## 2023-03-19 DIAGNOSIS — J449 Chronic obstructive pulmonary disease, unspecified: Secondary | ICD-10-CM | POA: Diagnosis not present

## 2023-03-19 DIAGNOSIS — Z7985 Long-term (current) use of injectable non-insulin antidiabetic drugs: Secondary | ICD-10-CM | POA: Diagnosis not present

## 2023-03-19 DIAGNOSIS — F339 Major depressive disorder, recurrent, unspecified: Secondary | ICD-10-CM | POA: Diagnosis not present

## 2023-03-19 DIAGNOSIS — Z515 Encounter for palliative care: Secondary | ICD-10-CM | POA: Diagnosis not present

## 2023-03-19 DIAGNOSIS — E119 Type 2 diabetes mellitus without complications: Secondary | ICD-10-CM | POA: Diagnosis not present

## 2023-03-19 DIAGNOSIS — D692 Other nonthrombocytopenic purpura: Secondary | ICD-10-CM | POA: Diagnosis not present

## 2023-03-27 DIAGNOSIS — D519 Vitamin B12 deficiency anemia, unspecified: Secondary | ICD-10-CM | POA: Diagnosis not present

## 2023-03-31 DIAGNOSIS — K219 Gastro-esophageal reflux disease without esophagitis: Secondary | ICD-10-CM | POA: Diagnosis not present

## 2023-04-06 DIAGNOSIS — H5213 Myopia, bilateral: Secondary | ICD-10-CM | POA: Diagnosis not present

## 2023-04-09 DIAGNOSIS — G4733 Obstructive sleep apnea (adult) (pediatric): Secondary | ICD-10-CM | POA: Diagnosis not present

## 2023-04-11 DIAGNOSIS — R0981 Nasal congestion: Secondary | ICD-10-CM | POA: Diagnosis not present

## 2023-04-11 DIAGNOSIS — J0110 Acute frontal sinusitis, unspecified: Secondary | ICD-10-CM | POA: Diagnosis not present

## 2023-04-20 DIAGNOSIS — R5383 Other fatigue: Secondary | ICD-10-CM | POA: Diagnosis not present

## 2023-04-20 DIAGNOSIS — G4737 Central sleep apnea in conditions classified elsewhere: Secondary | ICD-10-CM | POA: Diagnosis not present

## 2023-04-20 DIAGNOSIS — J189 Pneumonia, unspecified organism: Secondary | ICD-10-CM | POA: Diagnosis not present

## 2023-04-20 DIAGNOSIS — J301 Allergic rhinitis due to pollen: Secondary | ICD-10-CM | POA: Diagnosis not present

## 2023-04-20 DIAGNOSIS — G2581 Restless legs syndrome: Secondary | ICD-10-CM | POA: Diagnosis not present

## 2023-04-20 DIAGNOSIS — J449 Chronic obstructive pulmonary disease, unspecified: Secondary | ICD-10-CM | POA: Diagnosis not present

## 2023-04-20 DIAGNOSIS — R400 Somnolence: Secondary | ICD-10-CM | POA: Diagnosis not present

## 2023-04-20 DIAGNOSIS — R918 Other nonspecific abnormal finding of lung field: Secondary | ICD-10-CM | POA: Diagnosis not present

## 2023-04-21 DIAGNOSIS — J301 Allergic rhinitis due to pollen: Secondary | ICD-10-CM | POA: Diagnosis not present

## 2023-04-21 DIAGNOSIS — J449 Chronic obstructive pulmonary disease, unspecified: Secondary | ICD-10-CM | POA: Diagnosis not present

## 2023-04-21 DIAGNOSIS — R918 Other nonspecific abnormal finding of lung field: Secondary | ICD-10-CM | POA: Diagnosis not present

## 2023-04-21 DIAGNOSIS — J189 Pneumonia, unspecified organism: Secondary | ICD-10-CM | POA: Diagnosis not present

## 2023-04-22 DIAGNOSIS — J301 Allergic rhinitis due to pollen: Secondary | ICD-10-CM | POA: Diagnosis not present

## 2023-04-22 DIAGNOSIS — R918 Other nonspecific abnormal finding of lung field: Secondary | ICD-10-CM | POA: Diagnosis not present

## 2023-04-22 DIAGNOSIS — J449 Chronic obstructive pulmonary disease, unspecified: Secondary | ICD-10-CM | POA: Diagnosis not present

## 2023-04-22 DIAGNOSIS — J189 Pneumonia, unspecified organism: Secondary | ICD-10-CM | POA: Diagnosis not present

## 2023-04-23 DIAGNOSIS — R918 Other nonspecific abnormal finding of lung field: Secondary | ICD-10-CM | POA: Diagnosis not present

## 2023-04-23 DIAGNOSIS — J189 Pneumonia, unspecified organism: Secondary | ICD-10-CM | POA: Diagnosis not present

## 2023-04-23 DIAGNOSIS — J301 Allergic rhinitis due to pollen: Secondary | ICD-10-CM | POA: Diagnosis not present

## 2023-04-23 DIAGNOSIS — J449 Chronic obstructive pulmonary disease, unspecified: Secondary | ICD-10-CM | POA: Diagnosis not present

## 2023-04-23 DIAGNOSIS — J45991 Cough variant asthma: Secondary | ICD-10-CM | POA: Diagnosis not present

## 2023-04-24 DIAGNOSIS — J449 Chronic obstructive pulmonary disease, unspecified: Secondary | ICD-10-CM | POA: Diagnosis not present

## 2023-04-24 DIAGNOSIS — J301 Allergic rhinitis due to pollen: Secondary | ICD-10-CM | POA: Diagnosis not present

## 2023-04-24 DIAGNOSIS — R918 Other nonspecific abnormal finding of lung field: Secondary | ICD-10-CM | POA: Diagnosis not present

## 2023-04-24 DIAGNOSIS — J189 Pneumonia, unspecified organism: Secondary | ICD-10-CM | POA: Diagnosis not present

## 2023-05-01 DIAGNOSIS — R400 Somnolence: Secondary | ICD-10-CM | POA: Diagnosis not present

## 2023-05-01 DIAGNOSIS — E039 Hypothyroidism, unspecified: Secondary | ICD-10-CM | POA: Diagnosis not present

## 2023-05-01 DIAGNOSIS — J189 Pneumonia, unspecified organism: Secondary | ICD-10-CM | POA: Diagnosis not present

## 2023-05-01 DIAGNOSIS — J301 Allergic rhinitis due to pollen: Secondary | ICD-10-CM | POA: Diagnosis not present

## 2023-05-01 DIAGNOSIS — R5383 Other fatigue: Secondary | ICD-10-CM | POA: Diagnosis not present

## 2023-05-01 DIAGNOSIS — K219 Gastro-esophageal reflux disease without esophagitis: Secondary | ICD-10-CM | POA: Diagnosis not present

## 2023-05-01 DIAGNOSIS — Z8701 Personal history of pneumonia (recurrent): Secondary | ICD-10-CM | POA: Diagnosis not present

## 2023-05-01 DIAGNOSIS — G4737 Central sleep apnea in conditions classified elsewhere: Secondary | ICD-10-CM | POA: Diagnosis not present

## 2023-05-01 DIAGNOSIS — G2581 Restless legs syndrome: Secondary | ICD-10-CM | POA: Diagnosis not present

## 2023-05-01 DIAGNOSIS — Z515 Encounter for palliative care: Secondary | ICD-10-CM | POA: Diagnosis not present

## 2023-05-01 DIAGNOSIS — R918 Other nonspecific abnormal finding of lung field: Secondary | ICD-10-CM | POA: Diagnosis not present

## 2023-05-01 DIAGNOSIS — J449 Chronic obstructive pulmonary disease, unspecified: Secondary | ICD-10-CM | POA: Diagnosis not present

## 2023-05-01 DIAGNOSIS — Z9181 History of falling: Secondary | ICD-10-CM | POA: Diagnosis not present

## 2023-05-01 DIAGNOSIS — E785 Hyperlipidemia, unspecified: Secondary | ICD-10-CM | POA: Diagnosis not present

## 2023-05-01 DIAGNOSIS — Z79890 Hormone replacement therapy (postmenopausal): Secondary | ICD-10-CM | POA: Diagnosis not present

## 2023-05-08 DIAGNOSIS — R400 Somnolence: Secondary | ICD-10-CM | POA: Diagnosis not present

## 2023-05-08 DIAGNOSIS — G2581 Restless legs syndrome: Secondary | ICD-10-CM | POA: Diagnosis not present

## 2023-05-08 DIAGNOSIS — G4737 Central sleep apnea in conditions classified elsewhere: Secondary | ICD-10-CM | POA: Diagnosis not present

## 2023-05-08 DIAGNOSIS — J189 Pneumonia, unspecified organism: Secondary | ICD-10-CM | POA: Diagnosis not present

## 2023-05-08 DIAGNOSIS — R918 Other nonspecific abnormal finding of lung field: Secondary | ICD-10-CM | POA: Diagnosis not present

## 2023-05-08 DIAGNOSIS — J449 Chronic obstructive pulmonary disease, unspecified: Secondary | ICD-10-CM | POA: Diagnosis not present

## 2023-05-08 DIAGNOSIS — J301 Allergic rhinitis due to pollen: Secondary | ICD-10-CM | POA: Diagnosis not present

## 2023-05-08 DIAGNOSIS — R5383 Other fatigue: Secondary | ICD-10-CM | POA: Diagnosis not present

## 2023-05-10 DIAGNOSIS — G4733 Obstructive sleep apnea (adult) (pediatric): Secondary | ICD-10-CM | POA: Diagnosis not present

## 2023-05-12 DIAGNOSIS — J432 Centrilobular emphysema: Secondary | ICD-10-CM | POA: Diagnosis not present

## 2023-05-12 DIAGNOSIS — I700 Atherosclerosis of aorta: Secondary | ICD-10-CM | POA: Diagnosis not present

## 2023-05-12 DIAGNOSIS — R918 Other nonspecific abnormal finding of lung field: Secondary | ICD-10-CM | POA: Diagnosis not present

## 2023-05-29 DIAGNOSIS — E785 Hyperlipidemia, unspecified: Secondary | ICD-10-CM | POA: Diagnosis not present

## 2023-05-29 DIAGNOSIS — J989 Respiratory disorder, unspecified: Secondary | ICD-10-CM | POA: Diagnosis not present

## 2023-05-29 DIAGNOSIS — I4510 Unspecified right bundle-branch block: Secondary | ICD-10-CM | POA: Diagnosis not present

## 2023-05-29 DIAGNOSIS — I340 Nonrheumatic mitral (valve) insufficiency: Secondary | ICD-10-CM | POA: Diagnosis not present

## 2023-05-29 DIAGNOSIS — I6523 Occlusion and stenosis of bilateral carotid arteries: Secondary | ICD-10-CM | POA: Diagnosis not present

## 2023-05-29 DIAGNOSIS — G4733 Obstructive sleep apnea (adult) (pediatric): Secondary | ICD-10-CM | POA: Diagnosis not present

## 2023-06-05 DIAGNOSIS — R918 Other nonspecific abnormal finding of lung field: Secondary | ICD-10-CM | POA: Diagnosis not present

## 2023-06-05 DIAGNOSIS — J301 Allergic rhinitis due to pollen: Secondary | ICD-10-CM | POA: Diagnosis not present

## 2023-06-05 DIAGNOSIS — R5383 Other fatigue: Secondary | ICD-10-CM | POA: Diagnosis not present

## 2023-06-05 DIAGNOSIS — J189 Pneumonia, unspecified organism: Secondary | ICD-10-CM | POA: Diagnosis not present

## 2023-06-05 DIAGNOSIS — R400 Somnolence: Secondary | ICD-10-CM | POA: Diagnosis not present

## 2023-06-05 DIAGNOSIS — J449 Chronic obstructive pulmonary disease, unspecified: Secondary | ICD-10-CM | POA: Diagnosis not present

## 2023-06-05 DIAGNOSIS — G4737 Central sleep apnea in conditions classified elsewhere: Secondary | ICD-10-CM | POA: Diagnosis not present

## 2023-06-05 DIAGNOSIS — Z87891 Personal history of nicotine dependence: Secondary | ICD-10-CM | POA: Diagnosis not present

## 2023-06-09 DIAGNOSIS — G4733 Obstructive sleep apnea (adult) (pediatric): Secondary | ICD-10-CM | POA: Diagnosis not present

## 2023-06-23 DIAGNOSIS — K219 Gastro-esophageal reflux disease without esophagitis: Secondary | ICD-10-CM | POA: Diagnosis not present

## 2023-06-23 DIAGNOSIS — Z1211 Encounter for screening for malignant neoplasm of colon: Secondary | ICD-10-CM | POA: Diagnosis not present

## 2023-06-23 DIAGNOSIS — Z980 Intestinal bypass and anastomosis status: Secondary | ICD-10-CM | POA: Diagnosis not present

## 2023-06-23 DIAGNOSIS — Z7985 Long-term (current) use of injectable non-insulin antidiabetic drugs: Secondary | ICD-10-CM | POA: Diagnosis not present

## 2023-06-23 DIAGNOSIS — E039 Hypothyroidism, unspecified: Secondary | ICD-10-CM | POA: Diagnosis not present

## 2023-06-23 DIAGNOSIS — G4733 Obstructive sleep apnea (adult) (pediatric): Secondary | ICD-10-CM | POA: Diagnosis not present

## 2023-06-23 DIAGNOSIS — E119 Type 2 diabetes mellitus without complications: Secondary | ICD-10-CM | POA: Diagnosis not present

## 2023-06-23 DIAGNOSIS — Z87891 Personal history of nicotine dependence: Secondary | ICD-10-CM | POA: Diagnosis not present

## 2023-06-23 DIAGNOSIS — Z9049 Acquired absence of other specified parts of digestive tract: Secondary | ICD-10-CM | POA: Diagnosis not present

## 2023-06-23 DIAGNOSIS — I10 Essential (primary) hypertension: Secondary | ICD-10-CM | POA: Diagnosis not present

## 2023-06-23 DIAGNOSIS — Z8719 Personal history of other diseases of the digestive system: Secondary | ICD-10-CM | POA: Diagnosis not present

## 2023-06-23 DIAGNOSIS — K449 Diaphragmatic hernia without obstruction or gangrene: Secondary | ICD-10-CM | POA: Diagnosis not present

## 2023-06-23 DIAGNOSIS — J449 Chronic obstructive pulmonary disease, unspecified: Secondary | ICD-10-CM | POA: Diagnosis not present

## 2023-07-07 DIAGNOSIS — E119 Type 2 diabetes mellitus without complications: Secondary | ICD-10-CM | POA: Diagnosis not present

## 2023-07-07 DIAGNOSIS — Z515 Encounter for palliative care: Secondary | ICD-10-CM | POA: Diagnosis not present

## 2023-07-07 DIAGNOSIS — G4733 Obstructive sleep apnea (adult) (pediatric): Secondary | ICD-10-CM | POA: Diagnosis not present

## 2023-07-07 DIAGNOSIS — Z9181 History of falling: Secondary | ICD-10-CM | POA: Diagnosis not present

## 2023-07-07 DIAGNOSIS — D692 Other nonthrombocytopenic purpura: Secondary | ICD-10-CM | POA: Diagnosis not present

## 2023-07-07 DIAGNOSIS — J449 Chronic obstructive pulmonary disease, unspecified: Secondary | ICD-10-CM | POA: Diagnosis not present

## 2023-07-07 DIAGNOSIS — Z7985 Long-term (current) use of injectable non-insulin antidiabetic drugs: Secondary | ICD-10-CM | POA: Diagnosis not present

## 2023-07-10 DIAGNOSIS — G4733 Obstructive sleep apnea (adult) (pediatric): Secondary | ICD-10-CM | POA: Diagnosis not present

## 2023-07-13 DIAGNOSIS — G2581 Restless legs syndrome: Secondary | ICD-10-CM | POA: Diagnosis not present

## 2023-07-13 DIAGNOSIS — J301 Allergic rhinitis due to pollen: Secondary | ICD-10-CM | POA: Diagnosis not present

## 2023-07-13 DIAGNOSIS — J189 Pneumonia, unspecified organism: Secondary | ICD-10-CM | POA: Diagnosis not present

## 2023-07-13 DIAGNOSIS — J449 Chronic obstructive pulmonary disease, unspecified: Secondary | ICD-10-CM | POA: Diagnosis not present

## 2023-07-13 DIAGNOSIS — Z03818 Encounter for observation for suspected exposure to other biological agents ruled out: Secondary | ICD-10-CM | POA: Diagnosis not present

## 2023-07-13 DIAGNOSIS — G4737 Central sleep apnea in conditions classified elsewhere: Secondary | ICD-10-CM | POA: Diagnosis not present

## 2023-07-13 DIAGNOSIS — R400 Somnolence: Secondary | ICD-10-CM | POA: Diagnosis not present

## 2023-07-13 DIAGNOSIS — R5383 Other fatigue: Secondary | ICD-10-CM | POA: Diagnosis not present

## 2023-07-13 DIAGNOSIS — R918 Other nonspecific abnormal finding of lung field: Secondary | ICD-10-CM | POA: Diagnosis not present

## 2023-07-27 DIAGNOSIS — R400 Somnolence: Secondary | ICD-10-CM | POA: Diagnosis not present

## 2023-07-27 DIAGNOSIS — J301 Allergic rhinitis due to pollen: Secondary | ICD-10-CM | POA: Diagnosis not present

## 2023-07-27 DIAGNOSIS — R918 Other nonspecific abnormal finding of lung field: Secondary | ICD-10-CM | POA: Diagnosis not present

## 2023-07-27 DIAGNOSIS — G4733 Obstructive sleep apnea (adult) (pediatric): Secondary | ICD-10-CM | POA: Diagnosis not present

## 2023-07-27 DIAGNOSIS — G2581 Restless legs syndrome: Secondary | ICD-10-CM | POA: Diagnosis not present

## 2023-07-27 DIAGNOSIS — J449 Chronic obstructive pulmonary disease, unspecified: Secondary | ICD-10-CM | POA: Diagnosis not present

## 2023-07-27 DIAGNOSIS — R5383 Other fatigue: Secondary | ICD-10-CM | POA: Diagnosis not present

## 2023-08-07 DIAGNOSIS — Z515 Encounter for palliative care: Secondary | ICD-10-CM | POA: Diagnosis not present

## 2023-08-07 DIAGNOSIS — Z6821 Body mass index (BMI) 21.0-21.9, adult: Secondary | ICD-10-CM | POA: Diagnosis not present

## 2023-08-07 DIAGNOSIS — K219 Gastro-esophageal reflux disease without esophagitis: Secondary | ICD-10-CM | POA: Diagnosis not present

## 2023-08-07 DIAGNOSIS — Z9181 History of falling: Secondary | ICD-10-CM | POA: Diagnosis not present

## 2023-08-07 DIAGNOSIS — J449 Chronic obstructive pulmonary disease, unspecified: Secondary | ICD-10-CM | POA: Diagnosis not present

## 2023-08-07 DIAGNOSIS — H3530 Unspecified macular degeneration: Secondary | ICD-10-CM | POA: Diagnosis not present

## 2023-08-25 DIAGNOSIS — R5383 Other fatigue: Secondary | ICD-10-CM | POA: Diagnosis not present

## 2023-08-25 DIAGNOSIS — G4733 Obstructive sleep apnea (adult) (pediatric): Secondary | ICD-10-CM | POA: Diagnosis not present

## 2023-08-25 DIAGNOSIS — J449 Chronic obstructive pulmonary disease, unspecified: Secondary | ICD-10-CM | POA: Diagnosis not present

## 2023-08-25 DIAGNOSIS — R400 Somnolence: Secondary | ICD-10-CM | POA: Diagnosis not present

## 2023-09-21 DIAGNOSIS — J301 Allergic rhinitis due to pollen: Secondary | ICD-10-CM | POA: Diagnosis not present

## 2023-09-21 DIAGNOSIS — J189 Pneumonia, unspecified organism: Secondary | ICD-10-CM | POA: Diagnosis not present

## 2023-09-21 DIAGNOSIS — G4733 Obstructive sleep apnea (adult) (pediatric): Secondary | ICD-10-CM | POA: Diagnosis not present

## 2023-09-21 DIAGNOSIS — R051 Acute cough: Secondary | ICD-10-CM | POA: Diagnosis not present

## 2023-09-21 DIAGNOSIS — J449 Chronic obstructive pulmonary disease, unspecified: Secondary | ICD-10-CM | POA: Diagnosis not present

## 2023-09-22 DIAGNOSIS — R053 Chronic cough: Secondary | ICD-10-CM | POA: Diagnosis not present

## 2023-10-08 DIAGNOSIS — J209 Acute bronchitis, unspecified: Secondary | ICD-10-CM | POA: Diagnosis not present

## 2023-10-08 DIAGNOSIS — R0981 Nasal congestion: Secondary | ICD-10-CM | POA: Diagnosis not present

## 2023-10-08 DIAGNOSIS — J324 Chronic pansinusitis: Secondary | ICD-10-CM | POA: Diagnosis not present

## 2023-10-16 DIAGNOSIS — Z23 Encounter for immunization: Secondary | ICD-10-CM | POA: Diagnosis not present

## 2023-10-16 DIAGNOSIS — E1140 Type 2 diabetes mellitus with diabetic neuropathy, unspecified: Secondary | ICD-10-CM | POA: Diagnosis not present

## 2023-10-16 DIAGNOSIS — M1991 Primary osteoarthritis, unspecified site: Secondary | ICD-10-CM | POA: Diagnosis not present

## 2023-10-16 DIAGNOSIS — K2960 Other gastritis without bleeding: Secondary | ICD-10-CM | POA: Diagnosis not present

## 2023-10-16 DIAGNOSIS — M5441 Lumbago with sciatica, right side: Secondary | ICD-10-CM | POA: Diagnosis not present

## 2023-10-16 DIAGNOSIS — E113293 Type 2 diabetes mellitus with mild nonproliferative diabetic retinopathy without macular edema, bilateral: Secondary | ICD-10-CM | POA: Diagnosis not present

## 2023-10-16 DIAGNOSIS — I6523 Occlusion and stenosis of bilateral carotid arteries: Secondary | ICD-10-CM | POA: Diagnosis not present

## 2023-10-16 DIAGNOSIS — G8929 Other chronic pain: Secondary | ICD-10-CM | POA: Diagnosis not present

## 2023-10-16 DIAGNOSIS — M5442 Lumbago with sciatica, left side: Secondary | ICD-10-CM | POA: Diagnosis not present

## 2023-10-16 DIAGNOSIS — R262 Difficulty in walking, not elsewhere classified: Secondary | ICD-10-CM | POA: Diagnosis not present

## 2023-10-16 DIAGNOSIS — E785 Hyperlipidemia, unspecified: Secondary | ICD-10-CM | POA: Diagnosis not present

## 2023-10-16 DIAGNOSIS — E039 Hypothyroidism, unspecified: Secondary | ICD-10-CM | POA: Diagnosis not present

## 2023-11-02 DIAGNOSIS — N3001 Acute cystitis with hematuria: Secondary | ICD-10-CM | POA: Diagnosis not present

## 2023-11-02 DIAGNOSIS — R300 Dysuria: Secondary | ICD-10-CM | POA: Diagnosis not present

## 2023-11-06 DIAGNOSIS — K219 Gastro-esophageal reflux disease without esophagitis: Secondary | ICD-10-CM | POA: Diagnosis not present

## 2023-11-06 DIAGNOSIS — F32A Depression, unspecified: Secondary | ICD-10-CM | POA: Diagnosis not present

## 2023-11-06 DIAGNOSIS — R0902 Hypoxemia: Secondary | ICD-10-CM | POA: Diagnosis not present

## 2023-11-06 DIAGNOSIS — F419 Anxiety disorder, unspecified: Secondary | ICD-10-CM | POA: Diagnosis not present

## 2023-11-06 DIAGNOSIS — E860 Dehydration: Secondary | ICD-10-CM | POA: Diagnosis not present

## 2023-11-06 DIAGNOSIS — R918 Other nonspecific abnormal finding of lung field: Secondary | ICD-10-CM | POA: Diagnosis not present

## 2023-11-06 DIAGNOSIS — I10 Essential (primary) hypertension: Secondary | ICD-10-CM | POA: Diagnosis not present

## 2023-11-06 DIAGNOSIS — M1990 Unspecified osteoarthritis, unspecified site: Secondary | ICD-10-CM | POA: Diagnosis not present

## 2023-11-06 DIAGNOSIS — G8929 Other chronic pain: Secondary | ICD-10-CM | POA: Diagnosis not present

## 2023-11-06 DIAGNOSIS — Z9049 Acquired absence of other specified parts of digestive tract: Secondary | ICD-10-CM | POA: Diagnosis not present

## 2023-11-06 DIAGNOSIS — E43 Unspecified severe protein-calorie malnutrition: Secondary | ICD-10-CM | POA: Diagnosis not present

## 2023-11-06 DIAGNOSIS — E039 Hypothyroidism, unspecified: Secondary | ICD-10-CM | POA: Diagnosis not present

## 2023-11-06 DIAGNOSIS — Z4659 Encounter for fitting and adjustment of other gastrointestinal appliance and device: Secondary | ICD-10-CM | POA: Diagnosis not present

## 2023-11-06 DIAGNOSIS — I252 Old myocardial infarction: Secondary | ICD-10-CM | POA: Diagnosis not present

## 2023-11-06 DIAGNOSIS — Z0389 Encounter for observation for other suspected diseases and conditions ruled out: Secondary | ICD-10-CM | POA: Diagnosis not present

## 2023-11-06 DIAGNOSIS — K5651 Intestinal adhesions [bands], with partial obstruction: Secondary | ICD-10-CM | POA: Diagnosis not present

## 2023-11-06 DIAGNOSIS — D649 Anemia, unspecified: Secondary | ICD-10-CM | POA: Diagnosis not present

## 2023-11-06 DIAGNOSIS — E11649 Type 2 diabetes mellitus with hypoglycemia without coma: Secondary | ICD-10-CM | POA: Diagnosis not present

## 2023-11-06 DIAGNOSIS — E7800 Pure hypercholesterolemia, unspecified: Secondary | ICD-10-CM | POA: Diagnosis not present

## 2023-11-06 DIAGNOSIS — J449 Chronic obstructive pulmonary disease, unspecified: Secondary | ICD-10-CM | POA: Diagnosis not present

## 2023-11-06 DIAGNOSIS — K922 Gastrointestinal hemorrhage, unspecified: Secondary | ICD-10-CM | POA: Diagnosis not present

## 2023-11-06 DIAGNOSIS — H9201 Otalgia, right ear: Secondary | ICD-10-CM | POA: Diagnosis not present

## 2023-11-06 DIAGNOSIS — K56609 Unspecified intestinal obstruction, unspecified as to partial versus complete obstruction: Secondary | ICD-10-CM | POA: Diagnosis not present

## 2023-11-06 DIAGNOSIS — Z87891 Personal history of nicotine dependence: Secondary | ICD-10-CM | POA: Diagnosis not present

## 2023-11-06 DIAGNOSIS — Z681 Body mass index (BMI) 19 or less, adult: Secondary | ICD-10-CM | POA: Diagnosis not present

## 2023-11-06 DIAGNOSIS — E876 Hypokalemia: Secondary | ICD-10-CM | POA: Diagnosis not present

## 2023-11-06 DIAGNOSIS — K6389 Other specified diseases of intestine: Secondary | ICD-10-CM | POA: Diagnosis not present

## 2023-11-06 DIAGNOSIS — Z8701 Personal history of pneumonia (recurrent): Secondary | ICD-10-CM | POA: Diagnosis not present

## 2023-11-07 DIAGNOSIS — E43 Unspecified severe protein-calorie malnutrition: Secondary | ICD-10-CM | POA: Diagnosis not present

## 2023-11-07 DIAGNOSIS — K56609 Unspecified intestinal obstruction, unspecified as to partial versus complete obstruction: Secondary | ICD-10-CM | POA: Diagnosis not present

## 2023-11-07 DIAGNOSIS — R0902 Hypoxemia: Secondary | ICD-10-CM | POA: Diagnosis not present

## 2023-11-08 DIAGNOSIS — R0902 Hypoxemia: Secondary | ICD-10-CM | POA: Diagnosis not present

## 2023-11-08 DIAGNOSIS — E43 Unspecified severe protein-calorie malnutrition: Secondary | ICD-10-CM | POA: Diagnosis not present

## 2023-11-08 DIAGNOSIS — K56609 Unspecified intestinal obstruction, unspecified as to partial versus complete obstruction: Secondary | ICD-10-CM | POA: Diagnosis not present

## 2023-11-09 DIAGNOSIS — E43 Unspecified severe protein-calorie malnutrition: Secondary | ICD-10-CM | POA: Diagnosis not present

## 2023-11-09 DIAGNOSIS — R0902 Hypoxemia: Secondary | ICD-10-CM | POA: Diagnosis not present

## 2023-11-09 DIAGNOSIS — K56609 Unspecified intestinal obstruction, unspecified as to partial versus complete obstruction: Secondary | ICD-10-CM | POA: Diagnosis not present

## 2023-11-10 DIAGNOSIS — K56609 Unspecified intestinal obstruction, unspecified as to partial versus complete obstruction: Secondary | ICD-10-CM | POA: Diagnosis not present

## 2023-11-10 DIAGNOSIS — E43 Unspecified severe protein-calorie malnutrition: Secondary | ICD-10-CM | POA: Diagnosis not present

## 2023-11-10 DIAGNOSIS — R0902 Hypoxemia: Secondary | ICD-10-CM | POA: Diagnosis not present

## 2023-11-11 DIAGNOSIS — E43 Unspecified severe protein-calorie malnutrition: Secondary | ICD-10-CM | POA: Diagnosis not present

## 2023-11-11 DIAGNOSIS — K56609 Unspecified intestinal obstruction, unspecified as to partial versus complete obstruction: Secondary | ICD-10-CM | POA: Diagnosis not present

## 2023-11-11 DIAGNOSIS — R0902 Hypoxemia: Secondary | ICD-10-CM | POA: Diagnosis not present

## 2023-11-12 DIAGNOSIS — E43 Unspecified severe protein-calorie malnutrition: Secondary | ICD-10-CM | POA: Diagnosis not present

## 2023-11-12 DIAGNOSIS — K56609 Unspecified intestinal obstruction, unspecified as to partial versus complete obstruction: Secondary | ICD-10-CM | POA: Diagnosis not present

## 2023-11-12 DIAGNOSIS — R0902 Hypoxemia: Secondary | ICD-10-CM | POA: Diagnosis not present

## 2023-11-13 DIAGNOSIS — K56609 Unspecified intestinal obstruction, unspecified as to partial versus complete obstruction: Secondary | ICD-10-CM | POA: Diagnosis not present

## 2023-11-13 DIAGNOSIS — R0902 Hypoxemia: Secondary | ICD-10-CM | POA: Diagnosis not present

## 2023-11-13 DIAGNOSIS — E43 Unspecified severe protein-calorie malnutrition: Secondary | ICD-10-CM | POA: Diagnosis not present

## 2023-11-14 DIAGNOSIS — K56609 Unspecified intestinal obstruction, unspecified as to partial versus complete obstruction: Secondary | ICD-10-CM | POA: Diagnosis not present

## 2023-11-14 DIAGNOSIS — E43 Unspecified severe protein-calorie malnutrition: Secondary | ICD-10-CM | POA: Diagnosis not present

## 2023-11-14 DIAGNOSIS — R0902 Hypoxemia: Secondary | ICD-10-CM | POA: Diagnosis not present

## 2023-11-17 DIAGNOSIS — E039 Hypothyroidism, unspecified: Secondary | ICD-10-CM | POA: Diagnosis not present

## 2023-11-17 DIAGNOSIS — J189 Pneumonia, unspecified organism: Secondary | ICD-10-CM | POA: Diagnosis not present

## 2023-11-17 DIAGNOSIS — M5442 Lumbago with sciatica, left side: Secondary | ICD-10-CM | POA: Diagnosis not present

## 2023-11-17 DIAGNOSIS — R636 Underweight: Secondary | ICD-10-CM | POA: Diagnosis not present

## 2023-11-17 DIAGNOSIS — E876 Hypokalemia: Secondary | ICD-10-CM | POA: Diagnosis not present

## 2023-11-17 DIAGNOSIS — G8929 Other chronic pain: Secondary | ICD-10-CM | POA: Diagnosis not present

## 2023-11-17 DIAGNOSIS — R64 Cachexia: Secondary | ICD-10-CM | POA: Diagnosis not present

## 2023-11-17 DIAGNOSIS — M1991 Primary osteoarthritis, unspecified site: Secondary | ICD-10-CM | POA: Diagnosis not present

## 2023-11-17 DIAGNOSIS — K56609 Unspecified intestinal obstruction, unspecified as to partial versus complete obstruction: Secondary | ICD-10-CM | POA: Diagnosis not present

## 2023-11-17 DIAGNOSIS — F331 Major depressive disorder, recurrent, moderate: Secondary | ICD-10-CM | POA: Diagnosis not present

## 2023-11-17 DIAGNOSIS — E1140 Type 2 diabetes mellitus with diabetic neuropathy, unspecified: Secondary | ICD-10-CM | POA: Diagnosis not present

## 2023-11-17 DIAGNOSIS — M5441 Lumbago with sciatica, right side: Secondary | ICD-10-CM | POA: Diagnosis not present

## 2023-11-18 DIAGNOSIS — D72829 Elevated white blood cell count, unspecified: Secondary | ICD-10-CM | POA: Diagnosis not present

## 2023-11-18 DIAGNOSIS — K5790 Diverticulosis of intestine, part unspecified, without perforation or abscess without bleeding: Secondary | ICD-10-CM | POA: Diagnosis not present

## 2023-11-18 DIAGNOSIS — I6523 Occlusion and stenosis of bilateral carotid arteries: Secondary | ICD-10-CM | POA: Diagnosis not present

## 2023-11-18 DIAGNOSIS — E876 Hypokalemia: Secondary | ICD-10-CM | POA: Diagnosis not present

## 2023-11-18 DIAGNOSIS — F419 Anxiety disorder, unspecified: Secondary | ICD-10-CM | POA: Diagnosis not present

## 2023-11-18 DIAGNOSIS — E1140 Type 2 diabetes mellitus with diabetic neuropathy, unspecified: Secondary | ICD-10-CM | POA: Diagnosis not present

## 2023-11-18 DIAGNOSIS — M5441 Lumbago with sciatica, right side: Secondary | ICD-10-CM | POA: Diagnosis not present

## 2023-11-18 DIAGNOSIS — K5651 Intestinal adhesions [bands], with partial obstruction: Secondary | ICD-10-CM | POA: Diagnosis not present

## 2023-11-18 DIAGNOSIS — M47819 Spondylosis without myelopathy or radiculopathy, site unspecified: Secondary | ICD-10-CM | POA: Diagnosis not present

## 2023-11-18 DIAGNOSIS — E669 Obesity, unspecified: Secondary | ICD-10-CM | POA: Diagnosis not present

## 2023-11-18 DIAGNOSIS — G8929 Other chronic pain: Secondary | ICD-10-CM | POA: Diagnosis not present

## 2023-11-18 DIAGNOSIS — K219 Gastro-esophageal reflux disease without esophagitis: Secondary | ICD-10-CM | POA: Diagnosis not present

## 2023-11-18 DIAGNOSIS — I10 Essential (primary) hypertension: Secondary | ICD-10-CM | POA: Diagnosis not present

## 2023-11-18 DIAGNOSIS — E43 Unspecified severe protein-calorie malnutrition: Secondary | ICD-10-CM | POA: Diagnosis not present

## 2023-11-18 DIAGNOSIS — E113293 Type 2 diabetes mellitus with mild nonproliferative diabetic retinopathy without macular edema, bilateral: Secondary | ICD-10-CM | POA: Diagnosis not present

## 2023-11-18 DIAGNOSIS — D649 Anemia, unspecified: Secondary | ICD-10-CM | POA: Diagnosis not present

## 2023-11-18 DIAGNOSIS — F331 Major depressive disorder, recurrent, moderate: Secondary | ICD-10-CM | POA: Diagnosis not present

## 2023-11-18 DIAGNOSIS — M159 Polyosteoarthritis, unspecified: Secondary | ICD-10-CM | POA: Diagnosis not present

## 2023-11-18 DIAGNOSIS — E039 Hypothyroidism, unspecified: Secondary | ICD-10-CM | POA: Diagnosis not present

## 2023-11-18 DIAGNOSIS — J4489 Other specified chronic obstructive pulmonary disease: Secondary | ICD-10-CM | POA: Diagnosis not present

## 2023-11-18 DIAGNOSIS — E7800 Pure hypercholesterolemia, unspecified: Secondary | ICD-10-CM | POA: Diagnosis not present

## 2023-11-18 DIAGNOSIS — M810 Age-related osteoporosis without current pathological fracture: Secondary | ICD-10-CM | POA: Diagnosis not present

## 2023-11-18 DIAGNOSIS — E860 Dehydration: Secondary | ICD-10-CM | POA: Diagnosis not present

## 2023-11-18 DIAGNOSIS — M5442 Lumbago with sciatica, left side: Secondary | ICD-10-CM | POA: Diagnosis not present

## 2023-12-08 DIAGNOSIS — Z8719 Personal history of other diseases of the digestive system: Secondary | ICD-10-CM | POA: Diagnosis not present

## 2023-12-08 DIAGNOSIS — J449 Chronic obstructive pulmonary disease, unspecified: Secondary | ICD-10-CM | POA: Diagnosis not present

## 2023-12-08 DIAGNOSIS — Z7985 Long-term (current) use of injectable non-insulin antidiabetic drugs: Secondary | ICD-10-CM | POA: Diagnosis not present

## 2023-12-08 DIAGNOSIS — Z515 Encounter for palliative care: Secondary | ICD-10-CM | POA: Diagnosis not present

## 2023-12-08 DIAGNOSIS — Z681 Body mass index (BMI) 19 or less, adult: Secondary | ICD-10-CM | POA: Diagnosis not present

## 2023-12-08 DIAGNOSIS — Z7951 Long term (current) use of inhaled steroids: Secondary | ICD-10-CM | POA: Diagnosis not present

## 2023-12-08 DIAGNOSIS — E119 Type 2 diabetes mellitus without complications: Secondary | ICD-10-CM | POA: Diagnosis not present

## 2023-12-16 DIAGNOSIS — J329 Chronic sinusitis, unspecified: Secondary | ICD-10-CM | POA: Diagnosis not present

## 2023-12-16 DIAGNOSIS — Z681 Body mass index (BMI) 19 or less, adult: Secondary | ICD-10-CM | POA: Diagnosis not present

## 2023-12-16 DIAGNOSIS — R300 Dysuria: Secondary | ICD-10-CM | POA: Diagnosis not present

## 2023-12-17 DIAGNOSIS — K5651 Intestinal adhesions [bands], with partial obstruction: Secondary | ICD-10-CM | POA: Diagnosis not present

## 2023-12-22 DIAGNOSIS — E039 Hypothyroidism, unspecified: Secondary | ICD-10-CM | POA: Diagnosis not present

## 2023-12-22 DIAGNOSIS — M5441 Lumbago with sciatica, right side: Secondary | ICD-10-CM | POA: Diagnosis not present

## 2023-12-22 DIAGNOSIS — G8929 Other chronic pain: Secondary | ICD-10-CM | POA: Diagnosis not present

## 2023-12-22 DIAGNOSIS — K2960 Other gastritis without bleeding: Secondary | ICD-10-CM | POA: Diagnosis not present

## 2023-12-22 DIAGNOSIS — E785 Hyperlipidemia, unspecified: Secondary | ICD-10-CM | POA: Diagnosis not present

## 2023-12-22 DIAGNOSIS — J301 Allergic rhinitis due to pollen: Secondary | ICD-10-CM | POA: Diagnosis not present

## 2023-12-22 DIAGNOSIS — M1991 Primary osteoarthritis, unspecified site: Secondary | ICD-10-CM | POA: Diagnosis not present

## 2023-12-22 DIAGNOSIS — E1140 Type 2 diabetes mellitus with diabetic neuropathy, unspecified: Secondary | ICD-10-CM | POA: Diagnosis not present

## 2023-12-22 DIAGNOSIS — E113293 Type 2 diabetes mellitus with mild nonproliferative diabetic retinopathy without macular edema, bilateral: Secondary | ICD-10-CM | POA: Diagnosis not present

## 2023-12-22 DIAGNOSIS — F331 Major depressive disorder, recurrent, moderate: Secondary | ICD-10-CM | POA: Diagnosis not present

## 2023-12-22 DIAGNOSIS — M5442 Lumbago with sciatica, left side: Secondary | ICD-10-CM | POA: Diagnosis not present

## 2023-12-22 DIAGNOSIS — R636 Underweight: Secondary | ICD-10-CM | POA: Diagnosis not present

## 2024-01-05 DIAGNOSIS — I080 Rheumatic disorders of both mitral and aortic valves: Secondary | ICD-10-CM | POA: Diagnosis not present

## 2024-01-07 DIAGNOSIS — G4733 Obstructive sleep apnea (adult) (pediatric): Secondary | ICD-10-CM | POA: Diagnosis not present

## 2024-01-25 DIAGNOSIS — I6523 Occlusion and stenosis of bilateral carotid arteries: Secondary | ICD-10-CM | POA: Diagnosis not present

## 2024-01-25 DIAGNOSIS — I340 Nonrheumatic mitral (valve) insufficiency: Secondary | ICD-10-CM | POA: Diagnosis not present

## 2024-01-25 DIAGNOSIS — J989 Respiratory disorder, unspecified: Secondary | ICD-10-CM | POA: Diagnosis not present

## 2024-01-25 DIAGNOSIS — I4510 Unspecified right bundle-branch block: Secondary | ICD-10-CM | POA: Diagnosis not present

## 2024-01-25 DIAGNOSIS — I358 Other nonrheumatic aortic valve disorders: Secondary | ICD-10-CM | POA: Diagnosis not present

## 2024-02-18 DIAGNOSIS — I6523 Occlusion and stenosis of bilateral carotid arteries: Secondary | ICD-10-CM | POA: Diagnosis not present

## 2024-02-18 DIAGNOSIS — E785 Hyperlipidemia, unspecified: Secondary | ICD-10-CM | POA: Diagnosis not present

## 2024-02-23 DIAGNOSIS — E113293 Type 2 diabetes mellitus with mild nonproliferative diabetic retinopathy without macular edema, bilateral: Secondary | ICD-10-CM | POA: Diagnosis not present

## 2024-02-23 DIAGNOSIS — Z79899 Other long term (current) drug therapy: Secondary | ICD-10-CM | POA: Diagnosis not present

## 2024-02-23 DIAGNOSIS — M67912 Unspecified disorder of synovium and tendon, left shoulder: Secondary | ICD-10-CM | POA: Diagnosis not present

## 2024-02-23 DIAGNOSIS — F331 Major depressive disorder, recurrent, moderate: Secondary | ICD-10-CM | POA: Diagnosis not present

## 2024-02-23 DIAGNOSIS — E785 Hyperlipidemia, unspecified: Secondary | ICD-10-CM | POA: Diagnosis not present

## 2024-02-23 DIAGNOSIS — M1991 Primary osteoarthritis, unspecified site: Secondary | ICD-10-CM | POA: Diagnosis not present

## 2024-02-23 DIAGNOSIS — Z0000 Encounter for general adult medical examination without abnormal findings: Secondary | ICD-10-CM | POA: Diagnosis not present

## 2024-02-23 DIAGNOSIS — M5442 Lumbago with sciatica, left side: Secondary | ICD-10-CM | POA: Diagnosis not present

## 2024-02-23 DIAGNOSIS — K2960 Other gastritis without bleeding: Secondary | ICD-10-CM | POA: Diagnosis not present

## 2024-02-23 DIAGNOSIS — E1140 Type 2 diabetes mellitus with diabetic neuropathy, unspecified: Secondary | ICD-10-CM | POA: Diagnosis not present

## 2024-02-23 DIAGNOSIS — M5441 Lumbago with sciatica, right side: Secondary | ICD-10-CM | POA: Diagnosis not present

## 2024-02-23 DIAGNOSIS — E039 Hypothyroidism, unspecified: Secondary | ICD-10-CM | POA: Diagnosis not present

## 2024-03-14 DIAGNOSIS — H3589 Other specified retinal disorders: Secondary | ICD-10-CM | POA: Diagnosis not present

## 2024-03-14 DIAGNOSIS — E119 Type 2 diabetes mellitus without complications: Secondary | ICD-10-CM | POA: Diagnosis not present

## 2024-03-14 DIAGNOSIS — H5213 Myopia, bilateral: Secondary | ICD-10-CM | POA: Diagnosis not present

## 2024-04-25 DIAGNOSIS — E1140 Type 2 diabetes mellitus with diabetic neuropathy, unspecified: Secondary | ICD-10-CM | POA: Diagnosis not present

## 2024-04-25 DIAGNOSIS — S46811A Strain of other muscles, fascia and tendons at shoulder and upper arm level, right arm, initial encounter: Secondary | ICD-10-CM | POA: Diagnosis not present

## 2024-04-25 DIAGNOSIS — Z681 Body mass index (BMI) 19 or less, adult: Secondary | ICD-10-CM | POA: Diagnosis not present

## 2024-04-25 DIAGNOSIS — R636 Underweight: Secondary | ICD-10-CM | POA: Diagnosis not present

## 2024-05-17 DIAGNOSIS — L89151 Pressure ulcer of sacral region, stage 1: Secondary | ICD-10-CM | POA: Diagnosis not present

## 2024-05-17 DIAGNOSIS — Z681 Body mass index (BMI) 19 or less, adult: Secondary | ICD-10-CM | POA: Diagnosis not present

## 2024-05-17 DIAGNOSIS — M6740 Ganglion, unspecified site: Secondary | ICD-10-CM | POA: Diagnosis not present

## 2024-05-17 DIAGNOSIS — M6780 Other specified disorders of synovium and tendon, unspecified site: Secondary | ICD-10-CM | POA: Diagnosis not present

## 2024-05-17 DIAGNOSIS — R636 Underweight: Secondary | ICD-10-CM | POA: Diagnosis not present

## 2024-05-17 DIAGNOSIS — M7130 Other bursal cyst, unspecified site: Secondary | ICD-10-CM | POA: Diagnosis not present

## 2024-06-29 DIAGNOSIS — R051 Acute cough: Secondary | ICD-10-CM | POA: Diagnosis not present

## 2024-06-29 DIAGNOSIS — R918 Other nonspecific abnormal finding of lung field: Secondary | ICD-10-CM | POA: Diagnosis not present

## 2024-06-29 DIAGNOSIS — J301 Allergic rhinitis due to pollen: Secondary | ICD-10-CM | POA: Diagnosis not present

## 2024-06-29 DIAGNOSIS — G4733 Obstructive sleep apnea (adult) (pediatric): Secondary | ICD-10-CM | POA: Diagnosis not present

## 2024-06-29 DIAGNOSIS — J449 Chronic obstructive pulmonary disease, unspecified: Secondary | ICD-10-CM | POA: Diagnosis not present

## 2024-07-01 DIAGNOSIS — R918 Other nonspecific abnormal finding of lung field: Secondary | ICD-10-CM | POA: Diagnosis not present

## 2024-07-01 DIAGNOSIS — J181 Lobar pneumonia, unspecified organism: Secondary | ICD-10-CM | POA: Diagnosis not present

## 2024-07-04 DIAGNOSIS — R918 Other nonspecific abnormal finding of lung field: Secondary | ICD-10-CM | POA: Diagnosis not present

## 2024-07-04 DIAGNOSIS — J479 Bronchiectasis, uncomplicated: Secondary | ICD-10-CM | POA: Diagnosis not present

## 2024-07-07 DIAGNOSIS — J449 Chronic obstructive pulmonary disease, unspecified: Secondary | ICD-10-CM | POA: Diagnosis not present

## 2024-07-07 DIAGNOSIS — G2581 Restless legs syndrome: Secondary | ICD-10-CM | POA: Diagnosis not present

## 2024-07-07 DIAGNOSIS — G4733 Obstructive sleep apnea (adult) (pediatric): Secondary | ICD-10-CM | POA: Diagnosis not present

## 2024-07-07 DIAGNOSIS — J301 Allergic rhinitis due to pollen: Secondary | ICD-10-CM | POA: Diagnosis not present

## 2024-07-18 DIAGNOSIS — E785 Hyperlipidemia, unspecified: Secondary | ICD-10-CM | POA: Diagnosis not present

## 2024-07-18 DIAGNOSIS — J479 Bronchiectasis, uncomplicated: Secondary | ICD-10-CM | POA: Diagnosis not present

## 2024-07-18 DIAGNOSIS — R918 Other nonspecific abnormal finding of lung field: Secondary | ICD-10-CM | POA: Diagnosis not present

## 2024-07-18 DIAGNOSIS — R400 Somnolence: Secondary | ICD-10-CM | POA: Diagnosis not present

## 2024-07-18 DIAGNOSIS — F32A Depression, unspecified: Secondary | ICD-10-CM | POA: Diagnosis not present

## 2024-07-18 DIAGNOSIS — J449 Chronic obstructive pulmonary disease, unspecified: Secondary | ICD-10-CM | POA: Diagnosis not present

## 2024-07-18 DIAGNOSIS — Z7901 Long term (current) use of anticoagulants: Secondary | ICD-10-CM | POA: Diagnosis not present

## 2024-07-18 DIAGNOSIS — J301 Allergic rhinitis due to pollen: Secondary | ICD-10-CM | POA: Diagnosis not present

## 2024-07-18 DIAGNOSIS — G4733 Obstructive sleep apnea (adult) (pediatric): Secondary | ICD-10-CM | POA: Diagnosis not present

## 2024-07-18 DIAGNOSIS — I10 Essential (primary) hypertension: Secondary | ICD-10-CM | POA: Diagnosis not present

## 2024-07-18 DIAGNOSIS — Z79899 Other long term (current) drug therapy: Secondary | ICD-10-CM | POA: Diagnosis not present

## 2024-07-18 DIAGNOSIS — R5383 Other fatigue: Secondary | ICD-10-CM | POA: Diagnosis not present

## 2024-07-18 DIAGNOSIS — Z87891 Personal history of nicotine dependence: Secondary | ICD-10-CM | POA: Diagnosis not present

## 2024-07-18 DIAGNOSIS — K219 Gastro-esophageal reflux disease without esophagitis: Secondary | ICD-10-CM | POA: Diagnosis not present

## 2024-07-18 DIAGNOSIS — Z7952 Long term (current) use of systemic steroids: Secondary | ICD-10-CM | POA: Diagnosis not present

## 2024-07-18 DIAGNOSIS — F419 Anxiety disorder, unspecified: Secondary | ICD-10-CM | POA: Diagnosis not present

## 2024-07-18 DIAGNOSIS — Z79891 Long term (current) use of opiate analgesic: Secondary | ICD-10-CM | POA: Diagnosis not present

## 2024-07-18 DIAGNOSIS — G2581 Restless legs syndrome: Secondary | ICD-10-CM | POA: Diagnosis not present

## 2024-07-27 DIAGNOSIS — G2581 Restless legs syndrome: Secondary | ICD-10-CM | POA: Diagnosis not present

## 2024-07-27 DIAGNOSIS — J449 Chronic obstructive pulmonary disease, unspecified: Secondary | ICD-10-CM | POA: Diagnosis not present

## 2024-07-27 DIAGNOSIS — G4733 Obstructive sleep apnea (adult) (pediatric): Secondary | ICD-10-CM | POA: Diagnosis not present

## 2024-07-27 DIAGNOSIS — J301 Allergic rhinitis due to pollen: Secondary | ICD-10-CM | POA: Diagnosis not present

## 2024-08-29 DIAGNOSIS — R918 Other nonspecific abnormal finding of lung field: Secondary | ICD-10-CM | POA: Diagnosis not present

## 2024-08-29 DIAGNOSIS — J449 Chronic obstructive pulmonary disease, unspecified: Secondary | ICD-10-CM | POA: Diagnosis not present

## 2024-08-29 DIAGNOSIS — J301 Allergic rhinitis due to pollen: Secondary | ICD-10-CM | POA: Diagnosis not present

## 2024-08-29 DIAGNOSIS — G4733 Obstructive sleep apnea (adult) (pediatric): Secondary | ICD-10-CM | POA: Diagnosis not present

## 2024-09-09 DIAGNOSIS — G4733 Obstructive sleep apnea (adult) (pediatric): Secondary | ICD-10-CM | POA: Diagnosis not present

## 2024-10-24 DIAGNOSIS — R918 Other nonspecific abnormal finding of lung field: Secondary | ICD-10-CM | POA: Diagnosis not present

## 2024-10-24 DIAGNOSIS — J449 Chronic obstructive pulmonary disease, unspecified: Secondary | ICD-10-CM | POA: Diagnosis not present

## 2024-11-07 DIAGNOSIS — R059 Cough, unspecified: Secondary | ICD-10-CM | POA: Diagnosis not present

## 2024-11-07 DIAGNOSIS — R918 Other nonspecific abnormal finding of lung field: Secondary | ICD-10-CM | POA: Diagnosis not present

## 2024-11-07 DIAGNOSIS — G4733 Obstructive sleep apnea (adult) (pediatric): Secondary | ICD-10-CM | POA: Diagnosis not present

## 2024-11-07 DIAGNOSIS — J301 Allergic rhinitis due to pollen: Secondary | ICD-10-CM | POA: Diagnosis not present

## 2024-11-07 DIAGNOSIS — J449 Chronic obstructive pulmonary disease, unspecified: Secondary | ICD-10-CM | POA: Diagnosis not present
